# Patient Record
Sex: Female | Born: 1975 | Race: White | Hispanic: No | Marital: Single | Smoking: Never smoker
Health system: Southern US, Community
[De-identification: ages and names within clinical notes are randomized; demographics above are authoritative.]

## PROBLEM LIST (undated history)

## (undated) DIAGNOSIS — G43909 Migraine, unspecified, not intractable, without status migrainosus: Secondary | ICD-10-CM

## (undated) DIAGNOSIS — B019 Varicella without complication: Secondary | ICD-10-CM

## (undated) DIAGNOSIS — T7840XA Allergy, unspecified, initial encounter: Secondary | ICD-10-CM

## (undated) DIAGNOSIS — F329 Major depressive disorder, single episode, unspecified: Secondary | ICD-10-CM

## (undated) DIAGNOSIS — J45909 Unspecified asthma, uncomplicated: Secondary | ICD-10-CM

## (undated) DIAGNOSIS — J449 Chronic obstructive pulmonary disease, unspecified: Secondary | ICD-10-CM

## (undated) DIAGNOSIS — F32A Depression, unspecified: Secondary | ICD-10-CM

## (undated) HISTORY — DX: Allergy, unspecified, initial encounter: T78.40XA

## (undated) HISTORY — DX: Chronic obstructive pulmonary disease, unspecified: J44.9

## (undated) HISTORY — DX: Depression, unspecified: F32.A

## (undated) HISTORY — PX: CHOLECYSTECTOMY: SHX55

## (undated) HISTORY — DX: Varicella without complication: B01.9

## (undated) HISTORY — DX: Unspecified asthma, uncomplicated: J45.909

## (undated) HISTORY — DX: Migraine, unspecified, not intractable, without status migrainosus: G43.909

## (undated) HISTORY — DX: Major depressive disorder, single episode, unspecified: F32.9

---

## 2001-05-19 ENCOUNTER — Ambulatory Visit (HOSPITAL_COMMUNITY): Admission: RE | Admit: 2001-05-19 | Discharge: 2001-05-19 | Payer: Self-pay | Admitting: Infectious Diseases

## 2001-05-19 ENCOUNTER — Encounter: Payer: Self-pay | Admitting: Infectious Diseases

## 2002-04-25 ENCOUNTER — Emergency Department (HOSPITAL_COMMUNITY): Admission: EM | Admit: 2002-04-25 | Discharge: 2002-04-25 | Payer: Self-pay | Admitting: Emergency Medicine

## 2003-07-19 ENCOUNTER — Other Ambulatory Visit: Admission: RE | Admit: 2003-07-19 | Discharge: 2003-07-19 | Payer: Self-pay | Admitting: Obstetrics and Gynecology

## 2007-07-21 ENCOUNTER — Emergency Department (HOSPITAL_COMMUNITY): Admission: EM | Admit: 2007-07-21 | Discharge: 2007-07-22 | Payer: Self-pay | Admitting: Emergency Medicine

## 2008-01-03 ENCOUNTER — Ambulatory Visit: Payer: Self-pay | Admitting: Internal Medicine

## 2008-01-03 DIAGNOSIS — F329 Major depressive disorder, single episode, unspecified: Secondary | ICD-10-CM

## 2008-01-03 DIAGNOSIS — J309 Allergic rhinitis, unspecified: Secondary | ICD-10-CM | POA: Insufficient documentation

## 2008-01-03 DIAGNOSIS — F32A Depression, unspecified: Secondary | ICD-10-CM | POA: Insufficient documentation

## 2008-01-03 DIAGNOSIS — G43829 Menstrual migraine, not intractable, without status migrainosus: Secondary | ICD-10-CM | POA: Insufficient documentation

## 2008-01-03 DIAGNOSIS — J018 Other acute sinusitis: Secondary | ICD-10-CM | POA: Insufficient documentation

## 2008-01-03 DIAGNOSIS — R519 Headache, unspecified: Secondary | ICD-10-CM | POA: Insufficient documentation

## 2008-01-03 DIAGNOSIS — R51 Headache: Secondary | ICD-10-CM | POA: Insufficient documentation

## 2008-01-03 DIAGNOSIS — F909 Attention-deficit hyperactivity disorder, unspecified type: Secondary | ICD-10-CM | POA: Insufficient documentation

## 2008-01-03 DIAGNOSIS — G43909 Migraine, unspecified, not intractable, without status migrainosus: Secondary | ICD-10-CM | POA: Insufficient documentation

## 2008-06-28 ENCOUNTER — Telehealth: Payer: Self-pay | Admitting: *Deleted

## 2009-02-06 ENCOUNTER — Ambulatory Visit: Payer: Self-pay | Admitting: Internal Medicine

## 2009-02-06 DIAGNOSIS — J069 Acute upper respiratory infection, unspecified: Secondary | ICD-10-CM | POA: Insufficient documentation

## 2009-03-02 ENCOUNTER — Telehealth: Payer: Self-pay | Admitting: Internal Medicine

## 2009-08-02 ENCOUNTER — Encounter: Payer: Self-pay | Admitting: Internal Medicine

## 2009-08-24 ENCOUNTER — Ambulatory Visit: Payer: Self-pay | Admitting: Internal Medicine

## 2009-08-24 DIAGNOSIS — F411 Generalized anxiety disorder: Secondary | ICD-10-CM | POA: Insufficient documentation

## 2009-08-24 DIAGNOSIS — F438 Other reactions to severe stress: Secondary | ICD-10-CM

## 2009-10-09 ENCOUNTER — Telehealth: Payer: Self-pay | Admitting: *Deleted

## 2010-06-28 ENCOUNTER — Encounter: Payer: Self-pay | Admitting: Internal Medicine

## 2011-01-09 NOTE — Letter (Signed)
Summary: [facility]  [facility]   Imported By: Maryln Gottron 07/02/2010 14:43:34  _____________________________________________________________________  External Attachment:    Type:   Image     Comment:   External Document

## 2011-09-22 LAB — I-STAT 8, (EC8 V) (CONVERTED LAB)
Acid-Base Excess: 2
BUN: 12
Bicarbonate: 25.4 — ABNORMAL HIGH
Chloride: 104
Glucose, Bld: 98
HCT: 39
Hemoglobin: 13.3
Operator id: 208821
Potassium: 3.2 — ABNORMAL LOW
Sodium: 139
TCO2: 27
pCO2, Ven: 36.3 — ABNORMAL LOW
pH, Ven: 7.453 — ABNORMAL HIGH

## 2011-09-22 LAB — POCT I-STAT CREATININE
Creatinine, Ser: 1
Operator id: 208821

## 2011-09-22 LAB — DIFFERENTIAL
Basophils Relative: 1
Eosinophils Absolute: 0.1
Eosinophils Relative: 2
Lymphs Abs: 2.9
Monocytes Relative: 13 — ABNORMAL HIGH

## 2011-09-22 LAB — CBC
HCT: 36.2
Hemoglobin: 12.4
MCHC: 34.3
MCV: 84.7
Platelets: 283
RBC: 4.27
RDW: 12.6
WBC: 6.7

## 2011-12-25 ENCOUNTER — Ambulatory Visit (INDEPENDENT_AMBULATORY_CARE_PROVIDER_SITE_OTHER): Payer: Commercial Managed Care - PPO

## 2011-12-25 DIAGNOSIS — R059 Cough, unspecified: Secondary | ICD-10-CM

## 2011-12-25 DIAGNOSIS — R5383 Other fatigue: Secondary | ICD-10-CM

## 2011-12-25 DIAGNOSIS — J039 Acute tonsillitis, unspecified: Secondary | ICD-10-CM

## 2011-12-25 DIAGNOSIS — R5381 Other malaise: Secondary | ICD-10-CM

## 2011-12-25 DIAGNOSIS — R05 Cough: Secondary | ICD-10-CM

## 2012-08-27 ENCOUNTER — Encounter: Payer: Self-pay | Admitting: Internal Medicine

## 2012-12-02 ENCOUNTER — Ambulatory Visit (INDEPENDENT_AMBULATORY_CARE_PROVIDER_SITE_OTHER): Payer: Commercial Managed Care - PPO | Admitting: Physician Assistant

## 2012-12-02 VITALS — BP 168/86 | HR 84 | Temp 98.2°F | Resp 17 | Ht 68.0 in | Wt 231.0 lb

## 2012-12-02 DIAGNOSIS — J039 Acute tonsillitis, unspecified: Secondary | ICD-10-CM

## 2012-12-02 DIAGNOSIS — J029 Acute pharyngitis, unspecified: Secondary | ICD-10-CM

## 2012-12-02 DIAGNOSIS — R059 Cough, unspecified: Secondary | ICD-10-CM

## 2012-12-02 DIAGNOSIS — R05 Cough: Secondary | ICD-10-CM

## 2012-12-02 MED ORDER — HYDROCOD POLST-CHLORPHEN POLST 10-8 MG/5ML PO LQCR: 5.0000 mL | Freq: Two times a day (BID) | ORAL | Status: DC | PRN

## 2012-12-02 MED ORDER — FIRST-DUKES MOUTHWASH MT SUSP: 5.0000 mL | OROMUCOSAL | Status: DC

## 2012-12-02 MED ORDER — AZITHROMYCIN 250 MG PO TABS: ORAL_TABLET | ORAL | Status: DC

## 2012-12-02 NOTE — Progress Notes (Signed)
  Subjective:    Patient ID: Sherri Miller, female    DOB: 1976/05/11, 36 y.o.   MRN: [id number]  HPI 36 year old female presents with acute onset of productive cough, sore throat, swollen glands, and some chills.  Has a history of similar symptoms that progress to non-streptococcal tonsillitis, usually about 1x/year.  Has not taken any medications for this. Denies nausea, vomiting, otalgia, sinus pain, abdominal pain, or fevers.  She is otherwise healthy with no other complaints today.      Review of Systems  Constitutional: Positive for chills. Negative for fever.  HENT: Positive for congestion, sore throat and postnasal drip. Negative for ear pain and sinus pressure.   Respiratory: Positive for cough. Negative for shortness of breath and wheezing.   Cardiovascular: Negative for chest pain.  Gastrointestinal: Negative for nausea, vomiting and abdominal pain.  Neurological: Negative for dizziness and headaches.       Objective:   Physical Exam  Constitutional: She is oriented to person, place, and time. She appears well-developed and well-nourished.  HENT:  Head: Normocephalic and atraumatic.  Right Ear: External ear normal.  Left Ear: External ear normal.  Mouth/Throat: No oropharyngeal exudate (bilateral tonsillar erythema, no tonsillar swelling).  Eyes: Conjunctivae normal are normal.  Neck: Normal range of motion.  Cardiovascular: Normal rate, regular rhythm and normal heart sounds.   Pulmonary/Chest: Effort normal and breath sounds normal.  Lymphadenopathy:    Cervical adenopathy: AC.  Neurological: She is alert and oriented to person, place, and time.  Psychiatric: She has a normal mood and affect. Her behavior is normal. Judgment and thought content normal.          Assessment & Plan:   1. Tonsillitis  azithromycin (ZITHROMAX) 250 MG tablet  2. Cough  chlorpheniramine-HYDROcodone (TUSSIONEX PENNKINETIC ER) 10-8 MG/5ML LQCR  3. Acute pharyngitis  azithromycin  (ZITHROMAX) 250 MG tablet, Diphenhyd-Hydrocort-Nystatin (FIRST-DUKES MOUTHWASH) SUSP   Zpack - patient has not taken thorazine in >3 months - takes it only for severe migraines.  Instructed her not to use this until she has completed Zpack Tussionex bid prn cough Mucinex OTC as directed Increase fluids and rest Follow up if symptoms worsen or fail to improve.

## 2013-03-18 ENCOUNTER — Ambulatory Visit (INDEPENDENT_AMBULATORY_CARE_PROVIDER_SITE_OTHER): Payer: Commercial Managed Care - PPO | Admitting: Internal Medicine

## 2013-03-18 VITALS — BP 120/88 | HR 103 | Temp 98.5°F | Resp 16 | Ht 68.0 in | Wt 229.0 lb

## 2013-03-18 DIAGNOSIS — R05 Cough: Secondary | ICD-10-CM

## 2013-03-18 DIAGNOSIS — R52 Pain, unspecified: Secondary | ICD-10-CM

## 2013-03-18 DIAGNOSIS — J209 Acute bronchitis, unspecified: Secondary | ICD-10-CM

## 2013-03-18 DIAGNOSIS — R509 Fever, unspecified: Secondary | ICD-10-CM

## 2013-03-18 DIAGNOSIS — R059 Cough, unspecified: Secondary | ICD-10-CM

## 2013-03-18 LAB — POCT INFLUENZA A/B: Influenza A, POC: NEGATIVE

## 2013-03-18 MED ORDER — HYDROCODONE-ACETAMINOPHEN 7.5-325 MG/15ML PO SOLN: 5.0000 mL | Freq: Four times a day (QID) | ORAL | Status: DC | PRN

## 2013-03-18 MED ORDER — AZITHROMYCIN 500 MG PO TABS: 500.0000 mg | ORAL_TABLET | Freq: Every day | ORAL | Status: DC

## 2013-03-18 NOTE — Progress Notes (Signed)
  Subjective:    Patient ID: Sherri Miller, female    DOB: [birth year], 37 y.o.   MRN: [id number]  HPI Has fever cough, head congestion. No sob, cp. 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 Hot and sweaty.  Review of Systems     Objective:   Physical Exam  Constitutional: She is oriented to person, place, and time. She appears well-developed and well-nourished.  HENT:  Right Ear: External ear normal.  Left Ear: External ear normal.  Nose: Mucosal edema, rhinorrhea and sinus tenderness present. Right sinus exhibits no maxillary sinus tenderness and no frontal sinus tenderness. Left sinus exhibits no maxillary sinus tenderness and no frontal sinus tenderness.  Mouth/Throat: Oropharynx is clear and moist.  Eyes: EOM are normal.  Neck: No thyromegaly present.  Cardiovascular: Normal rate, regular rhythm and normal heart sounds.   Pulmonary/Chest: Effort normal and breath sounds normal. She has no wheezes. She exhibits no tenderness.  Lymphadenopathy:    She has no cervical adenopathy.  Neurological: She is alert and oriented to person, place, and time. No cranial nerve deficit. She exhibits normal muscle tone. Coordination normal.  Skin: No rash noted.  Psychiatric: She has a normal mood and affect.      Results for orders placed in visit on 03/18/13  POCT INFLUENZA A/B      Result Value Range   Influenza A, POC Negative     Influenza B, POC Negative         Assessment & Plan:  Mycoplasma likely Zithromax 500mg /LOrtab elixir

## 2013-03-18 NOTE — Patient Instructions (Addendum)

## 2013-12-25 ENCOUNTER — Ambulatory Visit (INDEPENDENT_AMBULATORY_CARE_PROVIDER_SITE_OTHER): Payer: Commercial Managed Care - PPO | Admitting: Internal Medicine

## 2013-12-25 VITALS — BP 124/84 | HR 91 | Temp 98.1°F | Resp 16 | Ht 68.0 in | Wt 237.0 lb

## 2013-12-25 DIAGNOSIS — H9209 Otalgia, unspecified ear: Secondary | ICD-10-CM

## 2013-12-25 DIAGNOSIS — H6590 Unspecified nonsuppurative otitis media, unspecified ear: Secondary | ICD-10-CM

## 2013-12-25 NOTE — Progress Notes (Signed)
   Subjective:    Patient ID: Sherri Miller, female    DOB: 05/29/1976, 38 y.o.   MRN: [id number]  HPI 38 yr old is here with complaints of ear pain in both ears. She states she feels more inner pressure and pain. Denies throat pain, sinus pressure, headache, or nasal drainage. She states she took a hydrocodone last night due to the pain. Could not get ears to pop till this am.  No uri sxs. No cp,sob, fever, teeth pain, or jaw pain.    Review of Systems Neg/Migrains    Objective:   Physical Exam  Vitals reviewed. Constitutional: She is oriented to person, place, and time. She appears well-developed and well-nourished. No distress.  HENT:  Head: Normocephalic.  Right Ear: External ear normal.  Left Ear: External ear normal.  Nose: Nose normal.  Mouth/Throat: Oropharynx is clear and moist.  Eyes: EOM are normal. Pupils are equal, round, and reactive to light.  Neck: Normal range of motion. Neck supple. No tracheal deviation present. No thyromegaly present.  Cardiovascular: Normal rate and normal heart sounds.   Pulmonary/Chest: Effort normal.  Lymphadenopathy:    She has no cervical adenopathy.  Neurological: She is alert and oriented to person, place, and time. She exhibits normal muscle tone. Coordination normal.  Skin: No rash noted.  Psychiatric: She has a normal mood and affect. Her behavior is normal.    Normal exam      Assessment & Plan:  ETD care

## 2013-12-25 NOTE — Progress Notes (Signed)
   Subjective:    Patient ID: Sherri AltesNicole L Anastas, female    DOB: 1976-03-01, 38 y.o.   MRN: [id number]  HPI    Review of Systems     Objective:   Physical Exam        Assessment & Plan:

## 2013-12-25 NOTE — Patient Instructions (Signed)
Serous Otitis Media  Serous otitis media is fluid in the middle ear space. This space contains the bones for hearing and air. Air in the middle ear space helps to transmit sound.  The air gets there through the eustachian tube. This tube goes from the back of the nose (nasopharynx) to the middle ear space. It keeps the pressure in the middle ear the same as the outside world. It also helps to drain fluid from the middle ear space. CAUSES  Serous otitis media occurs when the eustachian tube gets blocked. Blockage can come from:  Ear infections.  Colds and other upper respiratory infections.  Allergies.  Irritants such as cigarette smoke.  Sudden changes in air pressure (such as descending in an airplane).  Enlarged adenoids.  A mass in the nasopharynx. During colds and upper respiratory infections, the middle ear space can become temporarily filled with fluid. This can happen after an ear infection also. Once the infection clears, the fluid will generally drain out of the ear through the eustachian tube. If it does not, then serous otitis media occurs. SIGNS AND SYMPTOMS   Hearing loss.  A feeling of fullness in the ear, without pain.  Young children may not show any symptoms but may show slight behavioral changes, such as agitation, ear pulling, or crying. DIAGNOSIS  Serous otitis media is diagnosed by an ear exam. Tests may be done to check on the movement of the eardrum. Hearing exams may also be done. TREATMENT  The fluid most often goes away without treatment. If allergy is the cause, allergy treatment may be helpful. Fluid that persists for several months may require minor surgery. A small tube is placed in the eardrum to:  Drain the fluid.  Restore the air in the middle ear space. In certain situations, antibiotics are used to avoid surgery. Surgery may be done to remove enlarged adenoids (if this is the cause). HOME CARE INSTRUCTIONS   Keep children away from tobacco  smoke.  Be sure to keep any follow-up appointments. SEEK MEDICAL CARE IF:   Your hearing is not better in 3 months.  Your hearing is worse.  You have ear pain.  You have drainage from the ear.  You have dizziness.  You have serous otitis media only in one ear or have any bleeding from your nose (epistaxis).  You notice a lump on your neck. MAKE SURE YOU:  Understand these instructions.   Will watch your condition.   Will get help right away if you are not doing well or get worse.  Document Released: 02/14/2004 Document Revised: 07/27/2013 Document Reviewed: 06/21/2013 [employer] Patient Information 2014 [employer].  

## 2014-01-13 ENCOUNTER — Ambulatory Visit: Payer: Commercial Managed Care - PPO

## 2014-05-19 DIAGNOSIS — M47816 Spondylosis without myelopathy or radiculopathy, lumbar region: Secondary | ICD-10-CM | POA: Insufficient documentation

## 2014-05-19 DIAGNOSIS — M5136 Other intervertebral disc degeneration, lumbar region: Secondary | ICD-10-CM | POA: Insufficient documentation

## 2014-05-19 DIAGNOSIS — M5416 Radiculopathy, lumbar region: Secondary | ICD-10-CM | POA: Insufficient documentation

## 2014-11-04 ENCOUNTER — Encounter (HOSPITAL_COMMUNITY): Payer: Self-pay | Admitting: *Deleted

## 2014-11-04 ENCOUNTER — Emergency Department (HOSPITAL_COMMUNITY)
Admission: EM | Admit: 2014-11-04 | Discharge: 2014-11-04 | Disposition: A | Discharge status: Home / Self Care | Payer: Commercial Managed Care - PPO | Attending: Emergency Medicine | Admitting: Emergency Medicine

## 2014-11-04 DIAGNOSIS — Z880 Allergy status to penicillin: Secondary | ICD-10-CM | POA: Insufficient documentation

## 2014-11-04 DIAGNOSIS — Z79899 Other long term (current) drug therapy: Secondary | ICD-10-CM | POA: Diagnosis not present

## 2014-11-04 DIAGNOSIS — G43909 Migraine, unspecified, not intractable, without status migrainosus: Secondary | ICD-10-CM | POA: Diagnosis present

## 2014-11-04 DIAGNOSIS — Z792 Long term (current) use of antibiotics: Secondary | ICD-10-CM | POA: Insufficient documentation

## 2014-11-04 DIAGNOSIS — G43109 Migraine with aura, not intractable, without status migrainosus: Secondary | ICD-10-CM | POA: Insufficient documentation

## 2014-11-04 MED ORDER — METOCLOPRAMIDE HCL 5 MG/ML IJ SOLN
10.0000 mg | Freq: Once | INTRAMUSCULAR | Status: AC
  Administered 2014-11-04: 10 mg via INTRAVENOUS
  Filled 2014-11-04: qty 2

## 2014-11-04 NOTE — ED Notes (Signed)
Pt reports severe migraine with n/v since 5:00 this am, hx of same, sts just needs a cocktail to help her quick

## 2014-11-04 NOTE — Discharge Instructions (Signed)
Migraine Headache Contact your neurologist next week to let him know that you have are having recurrent migraines. Return if your condition worsens for any reason A migraine headache is an intense, throbbing pain on one or both sides of your head. A migraine can last for 30 minutes to several hours. CAUSES  The exact cause of a migraine headache is not always known. However, a migraine may be caused when nerves in the brain become irritated and release chemicals that cause inflammation. This causes pain. Certain things may also trigger migraines, such as:  Alcohol.  Smoking.  Stress.  Menstruation.  Aged cheeses.  Foods or drinks that contain nitrates, glutamate, aspartame, or tyramine.  Lack of sleep.  Chocolate.  Caffeine.  Hunger.  Physical exertion.  Fatigue.  Medicines used to treat chest pain (nitroglycerine), birth control pills, estrogen, and some blood pressure medicines. SIGNS AND SYMPTOMS  Pain on one or both sides of your head.  Pulsating or throbbing pain.  Severe pain that prevents daily activities.  Pain that is aggravated by any physical activity.  Nausea, vomiting, or both.  Dizziness.  Pain with exposure to bright lights, loud noises, or activity.  General sensitivity to bright lights, loud noises, or smells. Before you get a migraine, you may get warning signs that a migraine is coming (aura). An aura may include:  Seeing flashing lights.  Seeing bright spots, halos, or zigzag lines.  Having tunnel vision or blurred vision.  Having feelings of numbness or tingling.  Having trouble talking.  Having muscle weakness. DIAGNOSIS  A migraine headache is often diagnosed based on:  Symptoms.  Physical exam.  A CT scan or MRI of your head. These imaging tests cannot diagnose migraines, but they can help rule out other causes of headaches. TREATMENT Medicines may be given for pain and nausea. Medicines can also be given to help prevent  recurrent migraines.  HOME CARE INSTRUCTIONS  Only take over-the-counter or prescription medicines for pain or discomfort as directed by your health care provider. The use of long-term narcotics is not recommended.  Lie down in a dark, quiet room when you have a migraine.  Keep a journal to find out what may trigger your migraine headaches. For example, write down:  What you eat and drink.  How much sleep you get.  Any change to your diet or medicines.  Limit alcohol consumption.  Quit smoking if you smoke.  Get 7-9 hours of sleep, or as recommended by your health care provider.  Limit stress.  Keep lights dim if bright lights bother you and make your migraines worse. SEEK IMMEDIATE MEDICAL CARE IF:   Your migraine becomes severe.  You have a fever.  You have a stiff neck.  You have vision loss.  You have muscular weakness or loss of muscle control.  You start losing your balance or have trouble walking.  You feel faint or pass out.  You have severe symptoms that are different from your first symptoms. MAKE SURE YOU:   Understand these instructions.  Will watch your condition.  Will get help right away if you are not doing well or get worse. Document Released: 11/24/2005 Document Revised: 04/10/2014 Document Reviewed: 08/01/2013 [facility] Patient Information 2015 [place]. This information is not intended to replace advice given to you by your health care provider. Make sure you discuss any questions you have with your health care provider.

## 2014-11-04 NOTE — ED Provider Notes (Signed)
CSN: [id number]     Arrival date & time 11/04/14  0902 History   First MD Initiated Contact with Patient 11/04/14 [phone]     Chief Complaint  Patient presents with  . Migraine     (Consider location/radiation/quality/duration/timing/severity/associated sxs/prior Treatment) HPI Complains of migraine headache onset 5 AM today described as throbbing initially frontal has spread to her entire head typical of migraine she's had in the past. Symptoms accompanied by nausea and photophobia. Treated with cyclobenzaprine without relief. Pain worse with light not improved with anything. Past Medical History  Diagnosis Date  . Migraine   . Allergy    History reviewed. No pertinent past surgical history. No family history on file. History  Substance Use Topics  . Smoking status: Never Smoker   . Smokeless tobacco: Not on file  . Alcohol Use: No   OB History    No data available     Review of Systems  Eyes: Positive for photophobia.  Gastrointestinal: Positive for nausea.  Neurological: Positive for headaches.  All other systems reviewed and are negative.     Allergies  Codeine and Penicillins  Home Medications   Prior to Admission medications   Medication Sig Start Date End Date Taking? Authorizing Provider  azithromycin (ZITHROMAX) 250 MG tablet Take 2 tabs PO x 1 dose, then 1 tab PO QD x 4 days 12/02/12   Nelva NayHeather M Marte, PA-C  azithromycin (ZITHROMAX) 500 MG tablet Take 1 tablet (500 mg total) by mouth daily. 03/18/13   Jonita Albeehris W Guest, MD  chlorpheniramine-HYDROcodone [facility](TUSSIONEX PENNKINETIC ER) 10-8 MG/5ML LQCR Take 5 mLs by mouth every 12 (twelve) hours as needed (cough). 12/02/12   Heather Jaquita RectorM Marte, PA-C  chlorproMAZINE (THORAZINE) 10 MG tablet Take 10 mg by mouth every 6 (six) hours as needed.    Linus SalmonsJoseph K Miller, MD  Diphenhyd-Hydrocort-Nystatin (FIRST-DUKES MOUTHWASH) SUSP Use as directed 5 mLs in the mouth or throat every 3 (three) hours. Use 1:1 ratio with viscous lidocaine  12/02/12   Nelva NayHeather M Marte, PA-C  drospirenone-ethinyl estradiol (YAZ,GIANVI,LORYNA) 3-0.02 MG tablet Take 1 tablet by mouth daily.    Historical Provider, MD  frovatriptan (FROVA) 2.5 MG tablet Take 2.5 mg by mouth as needed. If recurs, may repeat after 2 hours. Max of 3 tabs in 24 hours.    Linus SalmonsJoseph K Miller, MD  gabapentin (NEURONTIN) 600 MG tablet Take 600 mg by mouth 3 (three) times daily.    Historical Provider, MD  HYDROcodone-acetaminophen (HYCET) 7.5-325 mg/15 ml solution Take 5 mLs by mouth every 6 (six) hours as needed for pain (or cough). 03/18/13   Jonita Albeehris W Guest, MD  hydrocodone-acetaminophen (LORCET-HD) 5-500 MG per capsule Take 1 capsule by mouth every 4 (four) hours as needed.    Linus SalmonsJoseph K Miller, MD  ondansetron (ZOFRAN) 8 MG tablet Take 8 mg by mouth every 8 (eight) hours as needed.    Linus SalmonsJoseph K Miller, MD  topiramate (TOPAMAX) 100 MG tablet Take 100 mg by mouth 2 (two) times daily.    Linus SalmonsJoseph K Miller, MD   BP 138/96 mmHg  Pulse 71  Temp(Src) 97.6 F (36.4 C) (Oral)  Resp 22  SpO2 100% Physical Exam  Constitutional: She is oriented to person, place, and time. She appears well-developed and well-nourished. She appears distressed.  Appears uncomfortable  HENT:  Head: Normocephalic and atraumatic.  Eyes: Conjunctivae are normal. Pupils are equal, round, and reactive to light.  Neck: Neck supple. No tracheal deviation present. No thyromegaly present.  Cardiovascular: Normal rate  and regular rhythm.   No murmur heard. Pulmonary/Chest: Effort normal and breath sounds normal.  Abdominal: Soft. Bowel sounds are normal. She exhibits no distension. There is no tenderness.  Musculoskeletal: Normal range of motion. She exhibits no edema or tenderness.  Neurological: She is alert and oriented to person, place, and time. She displays normal reflexes. No cranial nerve deficit. She exhibits normal muscle tone. Coordination normal.  Gait normal Romberg normal pronator drift normal finger to  nose normal DTR symmetric bilaterally at knee jerk ankle jerk and biceps toes downward going bilaterally  Skin: Skin is warm and dry. No rash noted.  Psychiatric: She has a normal mood and affect.  Nursing note and vitals reviewed.   ED Course  Procedures (including critical care time) Labs Review Labs Reviewed - No data to display  Imaging Review No results found.   EKG Interpretation None     11 AM feels much improved and ready to go home after treatment with intravenous Reglan. Patient alert and motor Glasgow Coma Score 15 MDM  Plan follow-up with neurologist as needed Diagnosis migraine headache Final diagnoses:  None        Doug SouSam Eliza Green, MD 11/04/14 1105

## 2015-03-17 ENCOUNTER — Telehealth: Payer: Commercial Managed Care - PPO | Admitting: Nurse Practitioner

## 2015-03-17 DIAGNOSIS — J0100 Acute maxillary sinusitis, unspecified: Secondary | ICD-10-CM

## 2015-03-17 MED ORDER — AZITHROMYCIN 250 MG PO TABS: ORAL_TABLET | ORAL | Status: DC

## 2015-03-17 NOTE — Progress Notes (Signed)

## 2015-03-18 ENCOUNTER — Ambulatory Visit (INDEPENDENT_AMBULATORY_CARE_PROVIDER_SITE_OTHER): Payer: 59 | Admitting: Physician Assistant

## 2015-03-18 VITALS — BP 118/88 | HR 78 | Temp 97.8°F | Resp 18 | Ht 68.0 in | Wt 226.0 lb

## 2015-03-18 DIAGNOSIS — R058 Other specified cough: Secondary | ICD-10-CM

## 2015-03-18 DIAGNOSIS — J029 Acute pharyngitis, unspecified: Secondary | ICD-10-CM

## 2015-03-18 DIAGNOSIS — R05 Cough: Secondary | ICD-10-CM | POA: Diagnosis not present

## 2015-03-18 MED ORDER — MAGIC MOUTHWASH W/LIDOCAINE: 10.0000 mL | ORAL | Status: DC | PRN

## 2015-03-18 MED ORDER — HYDROCODONE-HOMATROPINE 5-1.5 MG/5ML PO SYRP: 5.0000 mL | ORAL_SOLUTION | Freq: Three times a day (TID) | ORAL | Status: DC | PRN

## 2015-03-18 MED ORDER — IPRATROPIUM BROMIDE 0.03 % NA SOLN: 2.0000 | Freq: Two times a day (BID) | NASAL | Status: DC

## 2015-03-18 NOTE — Progress Notes (Signed)
Subjective:    Patient ID: Sherri Miller, female    DOB: 09-Sep-1976, 39 y.o.   MRN: [id number]  HPI  This is a 39 year old female who is presenting with 4 days of sore throat, cough and nasal congestion. Not congested as much as she is coughing up mucous. She has post-nasal drip that is green. Reports sore throat worsened yesterday and she was unable to sleep last night d/t pain. Yesterday she had an E-visit and was prescribed zpak for sinusitis. She started taking this yesterday but no help in symptoms yet. Also taking mucinex and no help. No problems with environmental allergies. No history of asthma and not a smoker. Denies fever, chills, SOB or wheezing.  Review of Systems  Constitutional: Negative for fever and chills.  HENT: Positive for congestion and sore throat. Negative for ear pain and sinus pressure.   Eyes: Negative for redness.  Respiratory: Positive for cough. Negative for shortness of breath and wheezing.   Gastrointestinal: Negative for nausea and vomiting.  Skin: Negative for rash.  Hematological: Negative for adenopathy.  Psychiatric/Behavioral: Positive for sleep disturbance.   Patient Active Problem List   Diagnosis Date Noted  . ANXIETY, SITUATIONAL 08/24/2009  . URI 02/06/2009  . DEPRESSION 01/03/2008  . ADHD 01/03/2008  . MIGRAINE HEADACHE 01/03/2008  . OTHER ACUTE SINUSITIS 01/03/2008  . ALLERGIC RHINITIS 01/03/2008  . HEADACHE 01/03/2008   Prior to Admission medications   Medication Sig Start Date End Date Taking? Authorizing Provider  azithromycin (ZITHROMAX Z-PAK) 250 MG tablet As directed 03/17/15  Yes Mary-Margaret Daphine Deutscher, FNP  drospirenone-ethinyl estradiol (YAZ,GIANVI,LORYNA) 3-0.02 MG tablet Take 1 tablet by mouth daily.   Yes Historical Provider, MD  gabapentin (NEURONTIN) 600 MG tablet Take 600 mg by mouth 3 (three) times daily.   Yes Historical Provider, MD  ondansetron (ZOFRAN) 8 MG tablet Take 8 mg by mouth every 8 (eight) hours as needed for  nausea.    Yes Linus Salmons, MD  rizatriptan (MAXALT-MLT) 10 MG disintegrating tablet Take 10 mg by mouth as needed for migraine. May repeat in 2 hours if needed   Yes Historical Provider, MD   Allergies  Allergen Reactions  . Codeine Nausea And Vomiting  . Penicillins Hives   Patient's social and family history were reviewed.     Objective:   Physical Exam  Constitutional: She is oriented to person, place, and time. She appears well-developed and well-nourished. No distress.  HENT:  Head: Normocephalic and atraumatic.  Right Ear: Hearing, external ear and ear canal normal. Tympanic membrane is retracted.  Left Ear: Hearing, tympanic membrane, external ear and ear canal normal.  Nose: Nose normal. Right sinus exhibits no maxillary sinus tenderness and no frontal sinus tenderness. Left sinus exhibits no maxillary sinus tenderness and no frontal sinus tenderness.  Mouth/Throat: Uvula is midline and mucous membranes are normal. Posterior oropharyngeal erythema present. No oropharyngeal exudate or posterior oropharyngeal edema.  Eyes: Conjunctivae and lids are normal. Right eye exhibits no discharge. Left eye exhibits no discharge. No scleral icterus.  Cardiovascular: Normal rate, regular rhythm, normal heart sounds and normal pulses.   No murmur heard. Pulmonary/Chest: Effort normal and breath sounds normal. No respiratory distress. She has no wheezes. She has no rhonchi. She has no rales.  Musculoskeletal: Normal range of motion.  Lymphadenopathy:       Head (right side): No submental, no submandibular and no tonsillar adenopathy present.       Head (left side): No submental, no submandibular  and no tonsillar adenopathy present.    She has no cervical adenopathy.  Neurological: She is alert and oriented to person, place, and time.  Skin: Skin is warm, dry and intact. No lesion and no rash noted.  Psychiatric: She has a normal mood and affect. Her speech is normal and behavior is  normal. Thought content normal.   BP 118/88 mmHg  Pulse 78  Temp(Src) 97.8 F (36.6 C) (Oral)  Resp 18  Ht 5\' 8"  (1.727 m)  Wt 226 lb (102.513 kg)  BMI 34.37 kg/m2  SpO2 98%     Assessment & Plan:  1. Sore throat 2. Productive cough It is my personal opinion that the etiology of pt's symptoms is likely viral although pt received zpak through [facility] yesterday. Gave atrovent nasal spray, mouthwash and cough syrup for symptoms control. She will return if symptoms are not improving in 7-10 days.  - ipratropium (ATROVENT) 0.03 % nasal spray; Place 2 sprays into both nostrils 2 (two) times daily.  Dispense: 30 mL; Refill: 0 - Alum & Mag Hydroxide-Simeth (MAGIC MOUTHWASH W/LIDOCAINE) SOLN; Take 10 mLs by mouth every 2 (two) hours as needed for mouth pain.  Dispense: 360 mL; Refill: 0 - HYDROcodone-homatropine (HYCODAN) 5-1.5 MG/5ML syrup; Take 5 mLs by mouth every 8 (eight) hours as needed for cough.  Dispense: 100 mL; Refill: 0   Roswell MinersNicole V. Dyke BrackettBush, PA-C, MHS [facility]  03/18/2015

## 2015-03-18 NOTE — Patient Instructions (Signed)
Use cough syrup at night. Use mouthwash every 2-3 hours for throat pain. Nasal spray twice a day. Drink plenty of water and get plenty of rest.

## 2015-03-19 ENCOUNTER — Telehealth: Payer: 59 | Admitting: Family

## 2015-03-19 DIAGNOSIS — H109 Unspecified conjunctivitis: Secondary | ICD-10-CM

## 2015-03-19 MED ORDER — POLYMYXIN B-TRIMETHOPRIM 10000-0.1 UNIT/ML-% OP SOLN: 2.0000 [drp] | OPHTHALMIC | Status: DC

## 2015-03-19 NOTE — Progress Notes (Signed)
We are sorry that you are not feeling well.  Here is how we plan to help!  Based on what you have shared with me it looks like you have conjunctivitis.  Conjunctivitis is a common inflammatory or infectious condition of the eye that is often referred to as "pink eye".  In most cases it is contagious (viral or bacterial). However, not all conjunctivitis requires antibiotics (ex. Allergic).  We have made appropriate suggestions for you based upon your presentation.  I have prescribed Polytrim Ophthalmic drops 1-2 drops 4 times a day times 5 days to [facility]  Pink eye can be highly contagious.  It is typically spread through direct contact with secretions, or contaminated objects or surfaces that one may have touched.  Strict handwashing is suggested with soap and water is urged.  If not available, use alcohol based had sanitizer.  Avoid unnecessary touching of the eye.  If you wear contact lenses, you will need to refrain from wearing them until you see no white discharge from the eye for at least 24 hours after being on medication.  You should see symptom improvement in 1-2 days after starting the medication regimen.  Call us if symptoms are not improved in 1-2 days.  Home Care:  Wash your hands often!  Do not wear your contacts until you complete your treatment plan.  Avoid sharing towels, bed linen, personal items with a person who has pink eye.  See attention for anyone in your home with similar symptoms.  Get Help Right Away If:  Your symptoms do not improve.  You develop blurred or loss of vision.  Your symptoms worsen (increased discharge, pain or redness)  Your e-visit answers were reviewed by a board certified advanced clinical practitioner to complete your personal care plan.  Depending on the condition, your plan could have included both over the counter or prescription medications.  If there is a problem please reply  once you have received a response from your  provider.  Your safety is important to us.  If you have drug allergies check your prescription carefully.    You can use MyChart to ask questions about today's visit, request a non-urgent call back, or ask for a work or school excuse.  You will get an e-mail in the next two days asking about your experience.  I hope that your e-visit has been valuable and will speed your recovery. Thank you for using e-visits.

## 2015-03-22 ENCOUNTER — Telehealth: Payer: Commercial Managed Care - PPO | Admitting: Physician Assistant

## 2015-03-22 DIAGNOSIS — J209 Acute bronchitis, unspecified: Secondary | ICD-10-CM

## 2015-03-22 MED ORDER — BENZONATATE 200 MG PO CAPS: 200.0000 mg | ORAL_CAPSULE | Freq: Three times a day (TID) | ORAL | Status: DC | PRN

## 2015-03-22 NOTE — Progress Notes (Signed)
We are sorry that you are not feeling well.  Here is how we plan to help!  Based on what you have shared with me it looks like you have upper respiratory tract inflammation that has resulted in a significant cough.  Inflammation and infection in the upper respiratory tract is commonly called bronchitis and has four common causes:  Allergies, Viral Infections, Acid Reflux and Bacterial Infections.  Allergies, viruses and acid reflux are treated by controlling symptoms or eliminating the cause. An example might be a cough caused by taking certain blood pressure medications. You stop the cough by changing the medication. Another example might be a cough caused by acid reflux. Controlling the reflux helps control the cough.  Based on your presentation I believe you most likely have A cough due to bacteria.  When patients have a fever and a productive cough with a change in color or increased sputum production, we are concerned about bacterial bronchitis. I am glad you have finished your antibiotic.  The lingering cough will be present until the respiratory tract has completely healed.  In addition you may use A prescription cough medication called Tessalon Perles 100mg . You may take 1-2 capsules every 8 hours as needed for your cough.    HOME CARE . Only take medications as instructed by your medical team. . Complete the entire course of an antibiotic. . Drink plenty of fluids and get plenty of rest. . Avoid close contacts especially the very young and the elderly . Cover your mouth if you cough or cough into your sleeve. . Always remember to wash your hands . A steam or ultrasonic humidifier can help congestion.    GET HELP RIGHT AWAY IF: . You develop worsening fever. . You become short of breath . You cough up blood. . Your symptoms persist after you have completed your treatment plan MAKE SURE YOU   Understand these instructions.  Will watch your condition.  Will get help right away if  you are not doing well or get worse.  Your e-visit answers were reviewed by a board certified advanced clinical practitioner to complete your personal care plan.  Depending on the condition, your plan could have included both over the counter or prescription medications.  If there is a problem please reply  once you have received a response from your provider.  Your safety is important to us.  If you have drug allergies check your prescription carefully.    You can use MyChart to ask questions about today's visit, request a non-urgent call back, or ask for a work or school excuse.  You will get an e-mail in the next two days asking about your experience.  I hope that your e-visit has been valuable and will speed your recovery. Thank you for using e-visits.

## 2015-03-23 ENCOUNTER — Encounter: Payer: Self-pay | Admitting: Physician Assistant

## 2015-03-23 DIAGNOSIS — R05 Cough: Secondary | ICD-10-CM

## 2015-03-23 DIAGNOSIS — R059 Cough, unspecified: Secondary | ICD-10-CM

## 2015-03-24 MED ORDER — HYDROCODONE-HOMATROPINE 5-1.5 MG/5ML PO SYRP: 5.0000 mL | ORAL_SOLUTION | Freq: Every day | ORAL | Status: DC

## 2015-03-24 NOTE — Telephone Encounter (Signed)
She has contacted us about her continued cough.  I will refill the hycodan.  She will have to return to the clinic to pick up her cough medicine.  Please let her know this.

## 2015-03-26 ENCOUNTER — Ambulatory Visit (INDEPENDENT_AMBULATORY_CARE_PROVIDER_SITE_OTHER): Payer: 59

## 2015-03-26 ENCOUNTER — Ambulatory Visit (INDEPENDENT_AMBULATORY_CARE_PROVIDER_SITE_OTHER): Payer: 59 | Admitting: Internal Medicine

## 2015-03-26 VITALS — BP 110/82 | HR 85 | Temp 98.1°F | Resp 16 | Ht 68.0 in | Wt 226.0 lb

## 2015-03-26 DIAGNOSIS — R5382 Chronic fatigue, unspecified: Secondary | ICD-10-CM

## 2015-03-26 DIAGNOSIS — R05 Cough: Secondary | ICD-10-CM | POA: Diagnosis not present

## 2015-03-26 DIAGNOSIS — J209 Acute bronchitis, unspecified: Secondary | ICD-10-CM | POA: Diagnosis not present

## 2015-03-26 DIAGNOSIS — R059 Cough, unspecified: Secondary | ICD-10-CM

## 2015-03-26 DIAGNOSIS — R6883 Chills (without fever): Secondary | ICD-10-CM | POA: Diagnosis not present

## 2015-03-26 LAB — POCT CBC
Granulocyte percent: 73 %G (ref 37–80)
HEMATOCRIT: 37.4 % — AB (ref 37.7–47.9)
HEMOGLOBIN: 12.5 g/dL (ref 12.2–16.2)
LYMPH, POC: 2.1 (ref 0.6–3.4)
MCH: 27.7 pg (ref 27–31.2)
MCHC: 33.5 g/dL (ref 31.8–35.4)
MCV: 82.9 fL (ref 80–97)
MID (cbc): 0.6 (ref 0–0.9)
MPV: 7.5 fL (ref 0–99.8)
POC GRANULOCYTE: 7.2 — AB (ref 2–6.9)
POC LYMPH %: 21.4 % (ref 10–50)
POC MID %: 5.6 %M (ref 0–12)
Platelet Count, POC: 259 10*3/uL (ref 142–424)
RBC: 4.51 M/uL (ref 4.04–5.48)
RDW, POC: 12.8 %
WBC: 9.9 10*3/uL (ref 4.6–10.2)

## 2015-03-26 MED ORDER — HYDROCODONE-ACETAMINOPHEN 7.5-325 MG/15ML PO SOLN: 5.0000 mL | Freq: Four times a day (QID) | ORAL | Status: DC | PRN

## 2015-03-26 MED ORDER — FLUCONAZOLE 150 MG PO TABS: 150.0000 mg | ORAL_TABLET | Freq: Once | ORAL | Status: DC

## 2015-03-26 MED ORDER — AZITHROMYCIN 500 MG PO TABS: 500.0000 mg | ORAL_TABLET | Freq: Every day | ORAL | Status: DC

## 2015-03-26 NOTE — Patient Instructions (Addendum)
Acute Bronchitis Bronchitis is inflammation of the airways that extend from the windpipe into the lungs (bronchi). The inflammation often causes mucus to develop. This leads to a cough, which is the most common symptom of bronchitis.  In acute bronchitis, the condition usually develops suddenly and goes away over time, usually in a couple weeks. Smoking, allergies, and asthma can make bronchitis worse. Repeated episodes of bronchitis may cause further lung problems.  CAUSES Acute bronchitis is most often caused by the same virus that causes a cold. The virus can spread from person to person (contagious) through coughing, sneezing, and touching contaminated objects. SIGNS AND SYMPTOMS   Cough.   Fever.   Coughing up mucus.   Body aches.   Chest congestion.   Chills.   Shortness of breath.   Sore throat.  DIAGNOSIS  Acute bronchitis is usually diagnosed through a physical exam. Your health care provider will also ask you questions about your medical history. Tests, such as chest X-rays, are sometimes done to rule out other conditions.  TREATMENT  Acute bronchitis usually goes away in a couple weeks. Oftentimes, no medical treatment is necessary. Medicines are sometimes given for relief of fever or cough. Antibiotic medicines are usually not needed but may be prescribed in certain situations. In some cases, an inhaler may be recommended to help reduce shortness of breath and control the cough. A cool mist vaporizer may also be used to help thin bronchial secretions and make it easier to clear the chest.  HOME CARE INSTRUCTIONS  Get plenty of rest.   Drink enough fluids to keep your urine clear or pale yellow (unless you have a medical condition that requires fluid restriction). Increasing fluids may help thin your respiratory secretions (sputum) and reduce chest congestion, and it will prevent dehydration.   Take medicines only as directed by your health care provider.  If  you were prescribed an antibiotic medicine, finish it all even if you start to feel better.  Avoid smoking and secondhand smoke. Exposure to cigarette smoke or irritating chemicals will make bronchitis worse. If you are a smoker, consider using nicotine gum or skin patches to help control withdrawal symptoms. Quitting smoking will help your lungs heal faster.   Reduce the chances of another bout of acute bronchitis by washing your hands frequently, avoiding people with cold symptoms, and trying not to touch your hands to your mouth, nose, or eyes.   Keep all follow-up visits as directed by your health care provider.  SEEK MEDICAL CARE IF: Your symptoms do not improve after 1 week of treatment.  SEEK IMMEDIATE MEDICAL CARE IF:  You develop an increased fever or chills.   You have chest pain.   You have severe shortness of breath.  You have bloody sputum.   You develop dehydration.  You faint or repeatedly feel like you are going to pass out.  You develop repeated vomiting.  You develop a severe headache. MAKE SURE YOU:   Understand these instructions.  Will watch your condition.  Will get help right away if you are not doing well or get worse. Document Released: 01/01/2005 Document Revised: 04/10/2014 Document Reviewed: 05/17/2013 ExitCare Patient Information 2015 [employer]. This information is not intended to replace advice given to you by your health care provider. Make sure you discuss any questions you have with your health care provider. Cough, Adult  A cough is a reflex that helps clear your throat and airways. It can help heal the body or may be   a reaction to an irritated airway. A cough may only last 2 or 3 weeks (acute) or may last more than 8 weeks (chronic).  CAUSES Acute cough:  Viral or bacterial infections. Chronic cough:  Infections.  Allergies.  Asthma.  Post-nasal drip.  Smoking.  Heartburn or acid reflux.  Some medicines.  Chronic  lung problems (COPD).  Cancer. SYMPTOMS   Cough.  Fever.  Chest pain.  Increased breathing rate.  High-pitched whistling sound when breathing (wheezing).  Colored mucus that you cough up (sputum). TREATMENT   A bacterial cough may be treated with antibiotic medicine.  A viral cough must run its course and will not respond to antibiotics.  Your caregiver may recommend other treatments if you have a chronic cough. HOME CARE INSTRUCTIONS   Only take over-the-counter or prescription medicines for pain, discomfort, or fever as directed by your caregiver. Use cough suppressants only as directed by your caregiver.  Use a cold steam vaporizer or humidifier in your bedroom or home to help loosen secretions.  Sleep in a semi-upright position if your cough is worse at night.  Rest as needed.  Stop smoking if you smoke. SEEK IMMEDIATE MEDICAL CARE IF:   You have pus in your sputum.  Your cough starts to worsen.  You cannot control your cough with suppressants and are losing sleep.  You begin coughing up blood.  You have difficulty breathing.  You develop pain which is getting worse or is uncontrolled with medicine.  You have a fever. MAKE SURE YOU:   Understand these instructions.  Will watch your condition.  Will get help right away if you are not doing well or get worse. Document Released: 05/23/2011 Document Revised: 02/16/2012 Document Reviewed: 05/23/2011 ExitCare Patient Information 2015 [employer]. This information is not intended to replace advice given to you by your health care provider. Make sure you discuss any questions you have with your health care provider.  

## 2015-03-26 NOTE — Progress Notes (Signed)
   Subjective:    Patient ID: Sherri Miller, female    DOB: 03/07/1976, 39 y.o.   MRN: [id number]  HPI 39 year old female  Works for [employer] Complains of cough not sleeping well  Chest pain from coughing No fever      Review of Systems  Constitutional: Positive for fever and fatigue.  HENT: Positive for rhinorrhea and sinus pressure.   Respiratory: Positive for cough.   Cardiovascular: Negative for chest pain.  Allergic/Immunologic: Negative.   Neurological: Negative.   Psychiatric/Behavioral: Negative.        Objective:   Physical Exam  Constitutional: She is oriented to person, place, and time. She appears well-developed and well-nourished. No distress.  HENT:  Head: Normocephalic.  Right Ear: External ear normal.  Left Ear: External ear normal.  Nose: Mucosal edema and rhinorrhea present.  Mouth/Throat: Oropharynx is clear and moist.  Eyes: Conjunctivae and EOM are normal. Pupils are equal, round, and reactive to light.  Neck: Normal range of motion. Neck supple. No thyromegaly present.  Cardiovascular: Normal rate, regular rhythm and normal heart sounds.   Pulmonary/Chest: Effort normal. No tachypnea. No respiratory distress. She has decreased breath sounds. She has no wheezes. She has rhonchi. She has no rales. She exhibits tenderness.  Lymphadenopathy:    She has no cervical adenopathy.  Neurological: She is alert and oriented to person, place, and time. She exhibits normal muscle tone. Coordination normal.  Psychiatric: She has a normal mood and affect.     Results for orders placed or performed in visit on 03/26/15  POCT CBC  Result Value Ref Range   WBC 9.9 4.6 - 10.2 K/uL   Lymph, poc 2.1 0.6 - 3.4   POC LYMPH PERCENT 21.4 10 - 50 %L   MID (cbc) 0.6 0 - 0.9   POC MID % 5.6 0 - 12 %M   POC Granulocyte 7.2 (A) 2 - 6.9   Granulocyte percent 73.0 37 - 80 %G   RBC 4.51 4.04 - 5.48 M/uL   Hemoglobin 12.5 12.2 - 16.2 g/dL   HCT, POC 40.937.4 (A) 81.137.7 - 47.9  %   MCV 82.9 80 - 97 fL   MCH, POC 27.7 27 - 31.2 pg   MCHC 33.5 31.8 - 35.4 g/dL   RDW, POC 91.412.8 %   Platelet Count, POC 259 142 - 424 K/uL   MPV 7.5 0 - 99.8 fL   [facility] reading (PRIMARY) by  Dr.Guest thickened bronchioles Right lower       Assessment & Plan:  Bronchitis/persistant Zithromax 500mg /Lortab elixir/diflucan yeast prevention

## 2015-03-29 ENCOUNTER — Ambulatory Visit (INDEPENDENT_AMBULATORY_CARE_PROVIDER_SITE_OTHER): Payer: 59 | Admitting: Family Medicine

## 2015-03-29 VITALS — BP 106/80 | HR 105 | Temp 98.1°F | Resp 16 | Ht 68.0 in | Wt 225.5 lb

## 2015-03-29 DIAGNOSIS — R059 Cough, unspecified: Secondary | ICD-10-CM

## 2015-03-29 DIAGNOSIS — J0100 Acute maxillary sinusitis, unspecified: Secondary | ICD-10-CM | POA: Diagnosis not present

## 2015-03-29 DIAGNOSIS — R05 Cough: Secondary | ICD-10-CM | POA: Diagnosis not present

## 2015-03-29 MED ORDER — ALBUTEROL SULFATE HFA 108 (90 BASE) MCG/ACT IN AERS: 2.0000 | INHALATION_SPRAY | Freq: Four times a day (QID) | RESPIRATORY_TRACT | Status: DC | PRN

## 2015-03-29 MED ORDER — PREDNISONE 20 MG PO TABS: ORAL_TABLET | ORAL | Status: DC

## 2015-03-29 MED ORDER — METHYLPREDNISOLONE SODIUM SUCC 125 MG IJ SOLR
125.0000 mg | Freq: Once | INTRAMUSCULAR | Status: AC
  Administered 2015-03-29: 125 mg via INTRAMUSCULAR

## 2015-03-29 MED ORDER — ALBUTEROL SULFATE (2.5 MG/3ML) 0.083% IN NEBU
2.5000 mg | INHALATION_SOLUTION | Freq: Once | RESPIRATORY_TRACT | Status: AC
  Administered 2015-03-29: 2.5 mg via RESPIRATORY_TRACT

## 2015-03-29 NOTE — Progress Notes (Addendum)
Subjective:   This chart was scribed for Sherri Simmer, MD by [name], ED Scribe. This patient was seen in Room 1 and the patient's care was started at 8:00 PM.   Patient ID: Sherri Miller, female    DOB: 1976-05-19, 39 y.o.   MRN: [id number]  03/29/2015  Follow-up   HPI  HPI Comments: Sherri Miller is a 39 y.o. female who presents to the [facility] for a third follow up visit for a gradually worsening, harsh, intermittent cough that has been going on for 2 weeks. She is having associated chills, nasal congestion, mild sneezing, HA, sinus pressure, and generalized myalgias. She notes she has ear pressure but denies otalgia. She is experiencing chest pain and R side pain due to the excessive cough. Pt notes when she blows her nose it is consistent of green and white to clear sputum. She states it is not thick.Pt was seen on 4/18 by Dr. Perrin Maltese complaining of a cough that was keeping her up at night. She had a CBC done and she had WBC of 9.9. She a CXR that came back normal. She was put on zithromax 500 mg daily and a higher dose hydrocodone cough syrup. She notes she has also been taking Mucinex D and Tessalon pearls with no relief. She denies fever, wheezing, SOB, sore throat, nausea or vomiting.  She is unable to sleep due to the cough.  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   Review of Systems  Constitutional: Positive for chills and fatigue. Negative for fever and diaphoresis.  HENT: Positive for congestion, rhinorrhea, sinus pressure and sneezing. Negative for ear pain and sore throat.   Respiratory: Positive for cough. Negative for shortness of breath and wheezing.   Cardiovascular: Positive for chest pain (due to excessive cough).  Gastrointestinal: Negative for nausea, vomiting and diarrhea.  Musculoskeletal: Positive for myalgias.  Skin: Negative for rash.  Neurological: Positive for headaches.    Past Medical History  Diagnosis Date  . Migraine   . Allergy     History reviewed. No pertinent past surgical history. Allergies  Allergen Reactions  . Codeine Nausea And Vomiting  . Penicillins Hives        Objective:    Triage Vitals: BP 106/80 mmHg  Pulse 105  Temp(Src) 98.1 F (36.7 C) (Oral)  Resp 16  Ht  (1.727 m)  Wt 225 lb 8 oz (102.286 kg)  BMI 34.30 kg/m2  SpO2 98%  Physical Exam  Constitutional: She is oriented to person, place, and time. She appears well-developed and well-nourished. No distress.  HENT:  Head: Normocephalic and atraumatic.  Right Ear: External ear normal.  Left Ear: External ear normal.  Nose: Mucosal edema present.  Mouth/Throat: Posterior oropharyngeal erythema (diffusely) present. No oropharyngeal exudate.  Eyes: Conjunctivae and EOM are normal.  Neck: Neck supple. No tracheal deviation present.  Cardiovascular: Normal rate, regular rhythm and normal heart sounds.   No murmur heard. Pulmonary/Chest: Effort normal and breath sounds normal. No respiratory distress. She has no decreased breath sounds. She has no wheezes. She has no rhonchi. She has no rales.  Harsh hacking cough throughout exam.  Musculoskeletal: Normal range of motion.  Lymphadenopathy:    She has no cervical adenopathy.  Neurological: She is alert and oriented to person, place, and time.  Skin: Skin is warm and dry. She is not diaphoretic.  Psychiatric: She has a normal mood and affect. Her behavior is normal.  Nursing note and vitals reviewed.  SOLUMEDROL 125MG  IM ADMINISTERED.  ALBUTEROL NEBULIZER ADMINISTERED.    Assessment & Plan:   1. Acute maxillary sinusitis, recurrence not specified   2. Cough    -Unchanged.  Cough is most severe symptom. -s/p Solumedrol 125mg  IM in office; rx for Prednisone taper provided. -Recommend Albuterol HFA every six hours scheduled for next week. -Continue Mucinex D and Hydrocodone cough syrup and Tessalon Perles. -If no improvement in 3-5 days, call for Doxycycline or Levaquin  therapy.  Suffering with moderate nasal congestion. -Continue Atrovent nasal spray for nasal congestion.   Meds ordered this encounter  Medications  . albuterol (PROVENTIL) (2.5 MG/3ML) 0.083% nebulizer solution 2.5 mg    Sig:   . methylPREDNISolone sodium succinate (SOLU-MEDROL) 125 mg/2 mL injection 125 mg    Sig:   . predniSONE (DELTASONE) 20 MG tablet    Sig: Three tablets daily x 1 day, then two tablets daily x 5 days, then one tablet daily x 5 days    Dispense:  18 tablet    Refill:  0  . albuterol (PROVENTIL HFA;VENTOLIN HFA) 108 (90 BASE) MCG/ACT inhaler    Sig: Inhale 2 puffs into the lungs every 6 (six) hours as needed for wheezing or shortness of breath.    Dispense:  1 Inhaler    Refill:  0    No Follow-up on file.   I personally performed the services described in this documentation, which was scribed in my presence. The recorded information has been reviewed and considered.  Cray Monnin Paulita FujitaMartin Aquiles Ruffini, M.D. [facility]  [place] [address] [place], [place]  [id number] [phone] phone [phone]) [phone] fax

## 2015-04-06 ENCOUNTER — Telehealth: Payer: Self-pay | Admitting: Radiology

## 2015-04-06 ENCOUNTER — Other Ambulatory Visit: Payer: Self-pay | Admitting: Family Medicine

## 2015-04-06 ENCOUNTER — Telehealth: Payer: Self-pay

## 2015-04-06 MED ORDER — ALBUTEROL SULFATE HFA 108 (90 BASE) MCG/ACT IN AERS
2.0000 | INHALATION_SPRAY | Freq: Four times a day (QID) | RESPIRATORY_TRACT | Status: DC | PRN
Start: 2015-04-06 — End: 2015-04-06

## 2015-04-06 MED ORDER — PROMETHAZINE-DM 6.25-15 MG/5ML PO SYRP: 5.0000 mL | ORAL_SOLUTION | Freq: Four times a day (QID) | ORAL | Status: DC | PRN

## 2015-04-06 MED ORDER — ALBUTEROL SULFATE HFA 108 (90 BASE) MCG/ACT IN AERS: 2.0000 | INHALATION_SPRAY | Freq: Four times a day (QID) | RESPIRATORY_TRACT | Status: DC | PRN

## 2015-04-06 NOTE — Telephone Encounter (Signed)
Insurance will not cover Proventil HFA. They will cover ProAir or Ventolin. Can we change Rx?

## 2015-04-06 NOTE — Telephone Encounter (Signed)
Pt.notified

## 2015-04-06 NOTE — Telephone Encounter (Signed)
Pt is requesting more cough medicine. She is still coughing a lot.

## 2015-04-06 NOTE — Telephone Encounter (Signed)
Rx for Pro air sent to pharmacy

## 2015-04-06 NOTE — Telephone Encounter (Signed)
Meds ordered this encounter  Medications  . promethazine-dextromethorphan (PROMETHAZINE-DM) 6.25-15 MG/5ML syrup    Sig: Take 5 mLs by mouth 4 (four) times daily as needed for cough.    Dispense:  118 mL    Refill:  0    Order Specific Question:  Supervising Provider    Answer:  DOOLITTLE, ROBERT P [[id number]]    

## 2015-04-08 ENCOUNTER — Other Ambulatory Visit: Payer: Self-pay | Admitting: Internal Medicine

## 2015-04-08 DIAGNOSIS — J209 Acute bronchitis, unspecified: Secondary | ICD-10-CM

## 2015-04-08 DIAGNOSIS — R059 Cough, unspecified: Secondary | ICD-10-CM

## 2015-04-08 DIAGNOSIS — R05 Cough: Secondary | ICD-10-CM

## 2015-04-08 DIAGNOSIS — R5382 Chronic fatigue, unspecified: Secondary | ICD-10-CM

## 2015-04-08 DIAGNOSIS — R6883 Chills (without fever): Secondary | ICD-10-CM

## 2015-04-08 MED ORDER — HYDROCODONE-ACETAMINOPHEN 7.5-325 MG/15ML PO SOLN: 5.0000 mL | Freq: Four times a day (QID) | ORAL | Status: DC | PRN

## 2015-04-08 NOTE — Addendum Note (Signed)
Addended byDonnajean Lopes: Danaysha Kirn M on: 04/08/2015 01:43 PM   Modules accepted: Orders

## 2015-04-08 NOTE — Telephone Encounter (Signed)
I have refilled her cough medicine. [facility]. I refilled a smaller bottle of the medication this time. She will need to come to clinic if she wants any further refills. If her cough persists despite this treatment she should come back to clinic for further work up.

## 2015-04-09 NOTE — Telephone Encounter (Signed)
Pt notified and is mostly better. Just needing cough syrup at night to help her sleep.

## 2015-04-21 ENCOUNTER — Ambulatory Visit (INDEPENDENT_AMBULATORY_CARE_PROVIDER_SITE_OTHER): Payer: 59 | Admitting: Family Medicine

## 2015-04-21 VITALS — BP 120/74 | HR 80 | Temp 98.8°F | Resp 18 | Ht 68.0 in | Wt 226.4 lb

## 2015-04-21 DIAGNOSIS — R05 Cough: Secondary | ICD-10-CM

## 2015-04-21 DIAGNOSIS — J209 Acute bronchitis, unspecified: Secondary | ICD-10-CM | POA: Diagnosis not present

## 2015-04-21 DIAGNOSIS — R059 Cough, unspecified: Secondary | ICD-10-CM

## 2015-04-21 MED ORDER — HYDROCOD POLST-CPM POLST ER 10-8 MG/5ML PO SUER: 5.0000 mL | Freq: Two times a day (BID) | ORAL | Status: DC | PRN

## 2015-04-21 MED ORDER — MOMETASONE FURO-FORMOTEROL FUM 200-5 MCG/ACT IN AERO: 2.0000 | INHALATION_SPRAY | Freq: Two times a day (BID) | RESPIRATORY_TRACT | Status: DC

## 2015-04-21 NOTE — Patient Instructions (Addendum)
This violent cough is typical for a postviral inflammation. This sometimes takes some fairly strong cough medicine to stop the cyclical coughing  Acute Bronchitis Bronchitis is inflammation of the airways that extend from the windpipe into the lungs (bronchi). The inflammation often causes mucus to develop. This leads to a cough, which is the most common symptom of bronchitis.  In acute bronchitis, the condition usually develops suddenly and goes away over time, usually in a couple weeks. Smoking, allergies, and asthma can make bronchitis worse. Repeated episodes of bronchitis may cause further lung problems.  CAUSES Acute bronchitis is most often caused by the same virus that causes a cold. The virus can spread from person to person (contagious) through coughing, sneezing, and touching contaminated objects. SIGNS AND SYMPTOMS   Cough.   Fever.   Coughing up mucus.   Body aches.   Chest congestion.   Chills.   Shortness of breath.   Sore throat.  DIAGNOSIS  Acute bronchitis is usually diagnosed through a physical exam. Your health care provider will also ask you questions about your medical history. Tests, such as chest X-rays, are sometimes done to rule out other conditions.  TREATMENT  Acute bronchitis usually goes away in a couple weeks. Oftentimes, no medical treatment is necessary. Medicines are sometimes given for relief of fever or cough. Antibiotic medicines are usually not needed but may be prescribed in certain situations. In some cases, an inhaler may be recommended to help reduce shortness of breath and control the cough. A cool mist vaporizer may also be used to help thin bronchial secretions and make it easier to clear the chest.  HOME CARE INSTRUCTIONS  Get plenty of rest.   Drink enough fluids to keep your urine clear or pale yellow (unless you have a medical condition that requires fluid restriction). Increasing fluids may help thin your respiratory  secretions (sputum) and reduce chest congestion, and it will prevent dehydration.   Take medicines only as directed by your health care provider.  If you were prescribed an antibiotic medicine, finish it all even if you start to feel better.  Avoid smoking and secondhand smoke. Exposure to cigarette smoke or irritating chemicals will make bronchitis worse. If you are a smoker, consider using nicotine gum or skin patches to help control withdrawal symptoms. Quitting smoking will help your lungs heal faster.   Reduce the chances of another bout of acute bronchitis by washing your hands frequently, avoiding people with cold symptoms, and trying not to touch your hands to your mouth, nose, or eyes.   Keep all follow-up visits as directed by your health care provider.  SEEK MEDICAL CARE IF: Your symptoms do not improve after 1 week of treatment.  SEEK IMMEDIATE MEDICAL CARE IF:  You develop an increased fever or chills.   You have chest pain.   You have severe shortness of breath.  You have bloody sputum.   You develop dehydration.  You faint or repeatedly feel like you are going to pass out.  You develop repeated vomiting.  You develop a severe headache. MAKE SURE YOU:   Understand these instructions.  Will watch your condition.  Will get help right away if you are not doing well or get worse. Document Released: 01/01/2005 Document Revised: 04/10/2014 Document Reviewed: 05/17/2013 [facility] Patient Information 2015 [place] [place]. This information is not intended to replace advice given to you by your health care provider. Make sure you discuss any questions you have with your health care provider.

## 2015-04-21 NOTE — Progress Notes (Signed)
° °  Subjective:    Patient ID: Sherri Miller, female    DOB: [birth year], 39 y.o.   MRN: [id number] This chart was scribed for Elvina SidleKurt Lauenstein, MD by Littie Deedsichard Sun, Medical Scribe. This patient was seen in Room 14 and the patient's care was started at 11:49 AM.   HPI HPI Comments: Sherri Miller is a 39 y.o. female who presents to the [facility] for a follow-up for gradual onset, intermittent cough that started a month ago. The cough has not improved or resolved. It has caused her some trouble sleeping. The cough is productive in the morning. Her cough is worst in the morning and at night. Patient has been seen here 3 times for this prior to today; first with Lanier ClamNicole Bush, PA-C, then with Dr. Perrin MalteseGuest, and most recently with Dr. Katrinka BlazingSmith. She did have a CXR done. Patient had been tried on two rounds of antibiotics, albuterol, and steroids. She has also been using Zyrtec, Claritin and Mucinex but without relief. The steroids and albuterol did not provide her relief. Patient denies hx of asthma and smoking. She states she has never had a cough this bad before. Patient has an upcoming trip to the beach.  Note from last visit with Dr. Katrinka BlazingSmith on 03/29/15: Sherri Miller is a 39 y.o. female who presents to the [facility] for a third follow up visit for a gradually worsening, harsh, intermittent cough that has been going on for 2 weeks. She is having associated chills, nasal congestion, mild sneezing, HA, sinus pressure, and generalized myalgias. She notes she has ear pressure but denies otalgia. She is experiencing chest pain and R side pain due to the excessive cough. Pt notes when she blows her nose it is consistent of green and white to clear sputum. She states it is not thick.Pt was seen on 4/18 by Dr. Perrin MalteseGuest complaining of a cough that was keeping her up at night. She had a CBC done and she had WBC of 9.9. She a CXR that came back normal. She was put on zithromax 500 mg daily and a  higher dose hydrocodone cough syrup. She notes she has also been taking Mucinex D and Tessalon pearls with no relief. She denies fever, wheezing, SOB, sore throat, nausea or vomiting. She is unable to sleep due to the cough. 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   Review of Systems  Respiratory: Positive for cough.        Objective:   Physical Exam CONSTITUTIONAL: Well developed/well nourished HEAD: Normocephalic/atraumatic EYES: EOM/PERRL ENMT: Mucous membranes moist NECK: supple no meningeal signs SPINE: entire spine nontender CV: S1/S2 noted, no murmurs/rubs/gallops noted LUNGS: Lungs are clear to auscultation bilaterally, no apparent distress ABDOMEN: soft, nontender, no rebound or guarding GU: no cva tenderness NEURO: Pt is awake/alert, moves all extremitiesx4 EXTREMITIES: pulses normal, full ROM SKIN: warm, color normal PSYCH: no abnormalities of mood noted  Persistent violent cough which is nonproductive during the office visit.    Assessment & Plan:   This chart was scribed in my presence and reviewed by me personally.    ICD-9-CM ICD-10-CM   1. Cough 786.2 R05 chlorpheniramine-HYDROcodone (TUSSIONEX PENNKINETIC ER) 10-8 MG/5ML SUER     mometasone-formoterol (DULERA) 200-5 MCG/ACT AERO  2. Acute bronchitis, unspecified organism 466.0 J20.9 mometasone-formoterol (DULERA) 200-5 MCG/ACT AERO   This persistent cough is typical for postviral inflammation bronchitis.  Signed, Elvina SidleKurt Lauenstein, MD

## 2015-07-14 ENCOUNTER — Ambulatory Visit (INDEPENDENT_AMBULATORY_CARE_PROVIDER_SITE_OTHER): Payer: 59 | Admitting: Family Medicine

## 2015-07-14 VITALS — BP 132/80 | HR 95 | Temp 98.0°F | Resp 17 | Ht 69.0 in | Wt 232.0 lb

## 2015-07-14 DIAGNOSIS — R05 Cough: Secondary | ICD-10-CM

## 2015-07-14 DIAGNOSIS — Z634 Disappearance and death of family member: Secondary | ICD-10-CM | POA: Diagnosis not present

## 2015-07-14 DIAGNOSIS — G4700 Insomnia, unspecified: Secondary | ICD-10-CM

## 2015-07-14 DIAGNOSIS — J209 Acute bronchitis, unspecified: Secondary | ICD-10-CM | POA: Diagnosis not present

## 2015-07-14 DIAGNOSIS — F411 Generalized anxiety disorder: Secondary | ICD-10-CM

## 2015-07-14 DIAGNOSIS — R059 Cough, unspecified: Secondary | ICD-10-CM

## 2015-07-14 MED ORDER — LEVOFLOXACIN 500 MG PO TABS: 500.0000 mg | ORAL_TABLET | Freq: Every day | ORAL | Status: DC

## 2015-07-14 MED ORDER — CLONAZEPAM 0.5 MG PO TABS: ORAL_TABLET | ORAL | Status: DC

## 2015-07-14 MED ORDER — HYDROCOD POLST-CPM POLST ER 10-8 MG/5ML PO SUER: 5.0000 mL | Freq: Two times a day (BID) | ORAL | Status: DC | PRN

## 2015-07-14 NOTE — Patient Instructions (Signed)
Acute Bronchitis °Bronchitis is inflammation of the airways that extend from the windpipe into the lungs (bronchi). The inflammation often causes mucus to develop. This leads to a cough, which is the most common symptom of bronchitis.  °In acute bronchitis, the condition usually develops suddenly and goes away over time, usually in a couple weeks. Smoking, allergies, and asthma can make bronchitis worse. Repeated episodes of bronchitis may cause further lung problems.  °CAUSES °Acute bronchitis is most often caused by the same virus that causes a cold. The virus can spread from person to person (contagious) through coughing, sneezing, and touching contaminated objects. °SIGNS AND SYMPTOMS  °· Cough.   °· Fever.   °· Coughing up mucus.   °· Body aches.   °· Chest congestion.   °· Chills.   °· Shortness of breath.   °· Sore throat.   °DIAGNOSIS  °Acute bronchitis is usually diagnosed through a physical exam. Your health care provider will also ask you questions about your medical history. Tests, such as chest X-rays, are sometimes done to rule out other conditions.  °TREATMENT  °Acute bronchitis usually goes away in a couple weeks. Oftentimes, no medical treatment is necessary. Medicines are sometimes given for relief of fever or cough. Antibiotic medicines are usually not needed but may be prescribed in certain situations. In some cases, an inhaler may be recommended to help reduce shortness of breath and control the cough. A cool mist vaporizer may also be used to help thin bronchial secretions and make it easier to clear the chest.  °HOME CARE INSTRUCTIONS °· Get plenty of rest.   °· Drink enough fluids to keep your urine clear or pale yellow (unless you have a medical condition that requires fluid restriction). Increasing fluids may help thin your respiratory secretions (sputum) and reduce chest congestion, and it will prevent dehydration.   °· Take medicines only as directed by your health care provider. °· If  you were prescribed an antibiotic medicine, finish it all even if you start to feel better. °· Avoid smoking and secondhand smoke. Exposure to cigarette smoke or irritating chemicals will make bronchitis worse. If you are a smoker, consider using nicotine gum or skin patches to help control withdrawal symptoms. Quitting smoking will help your lungs heal faster.   °· Reduce the chances of another bout of acute bronchitis by washing your hands frequently, avoiding people with cold symptoms, and trying not to touch your hands to your mouth, nose, or eyes.   °· Keep all follow-up visits as directed by your health care provider.   °SEEK MEDICAL CARE IF: °Your symptoms do not improve after 1 week of treatment.  °SEEK IMMEDIATE MEDICAL CARE IF: °· You develop an increased fever or chills.   °· You have chest pain.   °· You have severe shortness of breath. °· You have bloody sputum.   °· You develop dehydration. °· You faint or repeatedly feel like you are going to pass out. °· You develop repeated vomiting. °· You develop a severe headache. °MAKE SURE YOU:  °· Understand these instructions. °· Will watch your condition. °· Will get help right away if you are not doing well or get worse. °Document Released: 01/01/2005 Document Revised: 04/10/2014 Document Reviewed: 05/17/2013 °Exit[employer]® Patient Information ©2015 [employer]. This information is not intended to replace advice given to you by your health care provider. Make sure you discuss any questions you have with your health care provider. ° °

## 2015-07-14 NOTE — Progress Notes (Signed)
Chief Complaint:  Chief Complaint  Patient presents with  . Cough  . URI    HPI: Sherri Miller is a 39 y.o. female who reports to [facility] today complaining of 3-4 days of illness, she is very anxious this will turn out like last time where she had to come in 3-4 times and it progressively got worse. 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 She also [place] very stressed and this all began the same time she found out her exboyfriend and best fried killed himself, she has ahd a 4 day history of sinuses draiange, cough sore throat. Denies fevers or chills, wheezing, nv.abd pain, SOB, she has CP when she thinks about her friend but  not with cough She has allergies and has tried allergy med, has tried prevacid, wants to get ahead of this. 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  Draining cough, has some anxiety symptoms, but denies any SI.Hi/hallucinations  Past Medical History  Diagnosis Date  . Migraine   . Allergy    No past surgical history on file. History   Social History  . Marital Status: Single    Spouse Name: N/A  . Number of Children: N/A  . Years of Education: N/A   Social History Main Topics  . Smoking status: Never Smoker   . Smokeless tobacco: Never Used  . Alcohol Use: No  . Drug Use: No  . Sexual Activity: Yes    Birth Control/ Protection: Pill   Other Topics Concern  . None   Social History Narrative   No family history on file. Allergies  Allergen Reactions  . Codeine Nausea And Vomiting  . Penicillins Hives   Prior to Admission medications   Medication Sig Start Date End Date Taking? Authorizing Provider  drospirenone-ethinyl estradiol (YAZ,GIANVI,LORYNA) 3-0.02 MG tablet Take 1 tablet by mouth daily.   Yes Historical Provider, MD  gabapentin (NEURONTIN)  600 MG tablet Take 600 mg by mouth 3 (three) times daily.   Yes Historical Provider, MD  ondansetron (ZOFRAN) 8 MG tablet Take 8 mg by mouth every 8 (eight) hours as needed for nausea.    Yes Linus Salmons, MD  rizatriptan (MAXALT-MLT) 10 MG disintegrating tablet Take 10 mg by mouth as needed for migraine. May repeat in 2 hours if needed   Yes Historical Provider, MD     ROS: The patient denies fevers, chills, night sweats, unintentional weight loss, chest pain, palpitations, wheezing, dyspnea on exertion, nausea, vomiting, abdominal pain, dysuria, hematuria, melena, numbness, weakness, or tingling.   All other systems have been reviewed and were otherwise negative with the exception of those mentioned in the HPI and as above.    PHYSICAL EXAM: Filed Vitals:   07/14/15 0844  BP: 132/80  Pulse: 95  Temp: 98 F (36.7 C)  Resp: 17   Body mass index is 34.24 kg/(m^2).   General: Alert, no acute distress HEENT:  Normocephalic, atraumatic, oropharynx patent. EOMI, PERRLA Erythematous throat, no exudates, TM normal, neg sinus tenderness, + erythematous/boggy nasal mucosa Cardiovascular:  Regular rate and rhythm, no rubs murmurs or gallops.  No pedal edema.  Respiratory: Clear to auscultation bilaterally.  No wheezes, rales, or rhonchi.  No cyanosis, no use of accessory musculature Abdominal:  No organomegaly, abdomen is soft and non-tender, positive bowel sounds. No masses. Skin: No rashes. Neurologic: Facial musculature symmetric. Psychiatric: Patient acts appropriately throughout our interaction. Lymphatic: No cervical or submandibular lymphadenopathy Musculoskeletal: Gait intact. No edema, tenderness   LABS: Results for orders placed or performed in visit on 03/26/15  POCT CBC  Result Value Ref Range   WBC 9.9 4.6 - 10.2 K/uL   Lymph, poc 2.1 0.6 - 3.4   POC LYMPH PERCENT 21.4 10 - 50 %L   MID (cbc) 0.6 0 - 0.9   POC MID % 5.6 0 - 12 %M   POC Granulocyte 7.2 (A) 2 - 6.9    Granulocyte percent 73.0 37 - 80 %G   RBC 4.51 4.04 - 5.48 M/uL   Hemoglobin 12.5 12.2 - 16.2 g/dL   HCT, POC 16.1 (A) 09.6 - 47.9 %   MCV 82.9 80 - 97 fL   MCH, POC 27.7 27 - 31.2 pg   MCHC 33.5 31.8 - 35.4 g/dL   RDW, POC 04.5 %   Platelet Count, POC 259 142 - 424 K/uL   MPV 7.5 0 - 99.8 fL     EKG/XRAY:   Primary read interpreted by Dr. Conley Rolls at [facility].   ASSESSMENT/PLAN: Encounter Diagnoses  Name Primary?  . Acute bronchitis, unspecified organism Yes  . Cough   . Anxiety state   . Bereavement   . Insomnia    otc thraot lozenges, tessalon does not work so does not want it Cont with inhalers prn Rx levaquin, tussionex, and also clonazepam prn  Cont with giref counseling, the Clonazepam is just prn and also is short term, patient understands, she declines to take anything daily Fu in 1 month prn   Gross sideeffects, risk and benefits, and alternatives of medications d/w patient. Patient is aware that all medications have potential sideeffects and we are unable to predict every sideeffect or drug-drug interaction that may occur.  Mykalah Saari DO  07/14/2015 10:03 AM

## 2015-12-12 MED FILL — ESCITALOPRAM 10 MG TABLET: 10 | 30 days supply | Qty: 30 | Fill #1

## 2015-12-12 MED FILL — GABAPENTIN 300 MG CAPSULE: 300 | 30 days supply | Qty: 90 | Fill #0

## 2016-01-17 MED FILL — ESCITALOPRAM 10 MG TABLET: 10 | 30 days supply | Qty: 30 | Fill #2

## 2016-01-17 MED FILL — LORYNA 3-0.02 MG TAB: 3-0.02 | 62 days supply | Qty: 84 | Fill #3

## 2016-01-17 MED FILL — GABAPENTIN 300 MG CAPSULE: 300 | 30 days supply | Qty: 90 | Fill #1

## 2016-01-25 MED FILL — HYDROCODON-APAP 5-325: 5-325 | 8 days supply | Qty: 30 | Fill #0

## 2016-01-25 MED FILL — METHOCARBAMOL 500 MG TABLET: 500 | 20 days supply | Qty: 40 | Fill #0

## 2016-03-02 ENCOUNTER — Telehealth: Payer: Commercial Managed Care - PPO | Admitting: Family

## 2016-03-02 DIAGNOSIS — N390 Urinary tract infection, site not specified: Secondary | ICD-10-CM

## 2016-03-02 MED ORDER — SULFAMETHOXAZOLE-TRIMETHOPRIM 800-160 MG PO TABS: 1.0000 | ORAL_TABLET | Freq: Two times a day (BID) | ORAL | Status: DC

## 2016-03-02 NOTE — Progress Notes (Signed)

## 2016-03-03 MED FILL — SULFAMETHOXAZOLE/TMP DS TAB: 800-160 | 5 days supply | Qty: 10 | Fill #0

## 2016-03-03 MED FILL — GABAPENTIN 300 MG CAPSULE: 300 | 30 days supply | Qty: 90 | Fill #2

## 2016-03-03 MED FILL — ESCITALOPRAM 10 MG TABLET: 10 | 30 days supply | Qty: 30 | Fill #0

## 2016-03-14 DIAGNOSIS — F411 Generalized anxiety disorder: Secondary | ICD-10-CM | POA: Diagnosis not present

## 2016-03-14 DIAGNOSIS — G43019 Migraine without aura, intractable, without status migrainosus: Secondary | ICD-10-CM | POA: Diagnosis not present

## 2016-03-14 DIAGNOSIS — G43829 Menstrual migraine, not intractable, without status migrainosus: Secondary | ICD-10-CM | POA: Diagnosis not present

## 2016-03-14 MED FILL — BUPROPION HCL SR 150 MG TAB: 150 | 30 days supply | Qty: 60 | Fill #0

## 2016-03-14 MED FILL — CYCLOBENZAPRINE 10 MG TAB: 10 | 30 days supply | Qty: 90 | Fill #0

## 2016-03-14 MED FILL — RIZATRIPTAN 10 MG ODT: 10 | 30 days supply | Qty: 9 | Fill #0

## 2016-04-03 MED FILL — METHOCARBAMOL 500 MG TABLET: 500 | 20 days supply | Qty: 40 | Fill #0

## 2016-04-03 MED FILL — GABAPENTIN 300 MG CAPSULE: 300 | 30 days supply | Qty: 90 | Fill #3

## 2016-04-03 MED FILL — HYDROCODON-APAP 5-325: 5-325 | 15 days supply | Qty: 60 | Fill #0

## 2016-04-08 DIAGNOSIS — Z124 Encounter for screening for malignant neoplasm of cervix: Secondary | ICD-10-CM | POA: Diagnosis not present

## 2016-04-08 DIAGNOSIS — Z1151 Encounter for screening for human papillomavirus (HPV): Secondary | ICD-10-CM | POA: Diagnosis not present

## 2016-04-08 DIAGNOSIS — Z3041 Encounter for surveillance of contraceptive pills: Secondary | ICD-10-CM | POA: Diagnosis not present

## 2016-04-08 DIAGNOSIS — Z01419 Encounter for gynecological examination (general) (routine) without abnormal findings: Secondary | ICD-10-CM | POA: Diagnosis not present

## 2016-04-08 LAB — HM PAP SMEAR: HM Pap smear: NEGATIVE

## 2016-04-08 MED FILL — LORYNA 3-0.02 MG TAB: 3-0.02 | 72 days supply | Qty: 84 | Fill #0

## 2016-04-16 MED FILL — RIZATRIPTAN 10 MG ODT: 10 | 30 days supply | Qty: 9 | Fill #1

## 2016-04-16 MED FILL — BUPROPION HCL SR 150 MG TAB: 150 | 30 days supply | Qty: 60 | Fill #1

## 2016-04-18 LAB — RESULTS CONSOLE HPV: CHL HPV: NEGATIVE

## 2016-04-25 ENCOUNTER — Telehealth: Payer: Commercial Managed Care - PPO | Admitting: Family

## 2016-04-25 DIAGNOSIS — B9689 Other specified bacterial agents as the cause of diseases classified elsewhere: Secondary | ICD-10-CM

## 2016-04-25 DIAGNOSIS — J069 Acute upper respiratory infection, unspecified: Secondary | ICD-10-CM

## 2016-04-25 MED ORDER — DOXYCYCLINE HYCLATE 100 MG PO TABS: 100.0000 mg | ORAL_TABLET | Freq: Two times a day (BID) | ORAL | Status: DC

## 2016-04-25 NOTE — Progress Notes (Signed)

## 2016-05-07 MED FILL — GABAPENTIN 300 MG CAPSULE: 300 | 30 days supply | Qty: 90 | Fill #4

## 2016-05-07 MED FILL — RIZATRIPTAN 10 MG ODT: 10 | 30 days supply | Qty: 9 | Fill #2

## 2016-05-26 MED FILL — BUPROPION HCL SR 150 MG TAB: 150 | 30 days supply | Qty: 60 | Fill #2

## 2016-06-06 MED FILL — METHOCARBAMOL 500 MG TABLET: 500 | 20 days supply | Qty: 40 | Fill #0

## 2016-06-06 MED FILL — RIZATRIPTAN 10 MG ODT: 10 | 30 days supply | Qty: 9 | Fill #3

## 2016-06-06 MED FILL — LORYNA 3-0.02 MG TAB: 3-0.02 | 72 days supply | Qty: 84 | Fill #1 | Status: TO

## 2016-06-06 MED FILL — GABAPENTIN 300 MG CAPSULE: 300 | 30 days supply | Qty: 90 | Fill #5

## 2016-07-04 MED FILL — BUPROPION SR 150 MG TABLET: 150 | 30 days supply | Qty: 60 | Fill #3

## 2016-07-04 MED FILL — GABAPENTIN 300 MG CAPSULE: 300 | 30 days supply | Qty: 90 | Fill #6

## 2016-07-04 MED FILL — RIZATRIPTAN 10 MG ODT: 10 | 30 days supply | Qty: 9 | Fill #4

## 2016-07-23 DIAGNOSIS — H5203 Hypermetropia, bilateral: Secondary | ICD-10-CM | POA: Diagnosis not present

## 2016-07-23 DIAGNOSIS — H524 Presbyopia: Secondary | ICD-10-CM | POA: Diagnosis not present

## 2016-07-23 DIAGNOSIS — H52221 Regular astigmatism, right eye: Secondary | ICD-10-CM | POA: Diagnosis not present

## 2016-07-23 DIAGNOSIS — H11152 Pinguecula, left eye: Secondary | ICD-10-CM | POA: Diagnosis not present

## 2016-08-07 MED FILL — FLUCONAZOLE 150 MG TABLET: 150 | 7 days supply | Qty: 2 | Fill #0

## 2016-08-07 MED FILL — RIZATRIPTAN 10 MG ODT: 10 | 30 days supply | Qty: 9 | Fill #5

## 2016-09-12 DIAGNOSIS — G43019 Migraine without aura, intractable, without status migrainosus: Secondary | ICD-10-CM | POA: Diagnosis not present

## 2016-09-12 DIAGNOSIS — G43829 Menstrual migraine, not intractable, without status migrainosus: Secondary | ICD-10-CM | POA: Diagnosis not present

## 2016-09-12 DIAGNOSIS — F411 Generalized anxiety disorder: Secondary | ICD-10-CM | POA: Diagnosis not present

## 2016-09-12 MED FILL — HYDROCODON-APAP 5-325: 5-325 | 15 days supply | Qty: 60 | Fill #0

## 2016-09-12 MED FILL — RIZATRIPTAN 10 MG ODT: 10 | 30 days supply | Qty: 9 | Fill #6

## 2016-09-12 MED FILL — GABAPENTIN 600 MG TABLET: 600 | 90 days supply | Qty: 270 | Fill #0

## 2016-09-12 MED FILL — RIZATRIPTAN 10 MG TABLET: 10 | 30 days supply | Qty: 9 | Fill #0

## 2016-09-12 MED FILL — PHENADOZ 25 MG SUPP: 25 | 1 days supply | Qty: 4 | Fill #0

## 2016-11-06 MED FILL — RIZATRIPTAN 10 MG ODT: 10 | 30 days supply | Qty: 9 | Fill #7

## 2016-11-06 MED FILL — RIZATRIPTAN 10 MG TABLET: 10 | 30 days supply | Qty: 9 | Fill #1

## 2016-11-13 ENCOUNTER — Telehealth: Payer: Commercial Managed Care - PPO | Admitting: Physician Assistant

## 2016-11-13 DIAGNOSIS — J208 Acute bronchitis due to other specified organisms: Secondary | ICD-10-CM

## 2016-11-13 DIAGNOSIS — B9689 Other specified bacterial agents as the cause of diseases classified elsewhere: Secondary | ICD-10-CM

## 2016-11-13 MED ORDER — BENZONATATE 100 MG PO CAPS: 100.0000 mg | ORAL_CAPSULE | Freq: Three times a day (TID) | ORAL | 0 refills | Status: DC | PRN

## 2016-11-13 MED ORDER — DOXYCYCLINE HYCLATE 100 MG PO CAPS: 100.0000 mg | ORAL_CAPSULE | Freq: Two times a day (BID) | ORAL | 0 refills | Status: DC

## 2016-11-13 NOTE — Progress Notes (Signed)
We are sorry that you are not feeling well.  Here is how we plan to help!  Based on what you have shared with me it looks like you have upper respiratory tract inflammation that has resulted in a significant cough.  Inflammation and infection in the upper respiratory tract is commonly called bronchitis and has four common causes:  Allergies, Viral Infections, Acid Reflux and Bacterial Infections.  Allergies, viruses and acid reflux are treated by controlling symptoms or eliminating the cause. An example might be a cough caused by taking certain blood pressure medications. You stop the cough by changing the medication. Another example might be a cough caused by acid reflux. Controlling the reflux helps control the cough.  Based on your presentation I believe you most likely have A cough due to bacteria.  When patients have a fever and a productive cough with a change in color or increased sputum production, we are concerned about bacterial bronchitis.  If left untreated it can progress to pneumonia.  If your symptoms do not improve with your treatment plan it is important that you contact your provider.   I hve prescribed Doxycycline 100 mg twice a day for 7 days     In addition you may use A prescription cough medication called Tessalon Perles 100mg. You may take 1-2 capsules every 8 hours as needed for your cough.    USE OF BRONCHODILATOR ("RESCUE") INHALERS: There is a risk from using your bronchodilator too frequently.  The risk is that over-reliance on a medication which only relaxes the muscles surrounding the breathing tubes can reduce the effectiveness of medications prescribed to reduce swelling and congestion of the tubes themselves.  Although you feel brief relief from the bronchodilator inhaler, your asthma may actually be worsening with the tubes becoming more swollen and filled with mucus.  This can delay other crucial treatments, such as oral steroid medications. If you need to use a  bronchodilator inhaler daily, several times per day, you should discuss this with your provider.  There are probably better treatments that could be used to keep your asthma under control.     HOME CARE . Only take medications as instructed by your medical team. . Complete the entire course of an antibiotic. . Drink plenty of fluids and get plenty of rest. . Avoid close contacts especially the very young and the elderly . Cover your mouth if you cough or cough into your sleeve. . Always remember to wash your hands . A steam or ultrasonic humidifier can help congestion.   GET HELP RIGHT AWAY IF: . You develop worsening fever. . You become short of breath . You cough up blood. . Your symptoms persist after you have completed your treatment plan MAKE SURE YOU   Understand these instructions.  Will watch your condition.  Will get help right away if you are not doing well or get worse.  Your e-visit answers were reviewed by a board certified advanced clinical practitioner to complete your personal care plan.  Depending on the condition, your plan could have included both over the counter or prescription medications. If there is a problem please reply  once you have received a response from your provider. Your safety is important to us.  If you have drug allergies check your prescription carefully.    You can use MyChart to ask questions about today's visit, request a non-urgent call back, or ask for a work or school excuse for 24 hours related to this e-Visit. If   it has been greater than 24 hours you will need to follow up with your provider, or enter a new e-Visit to address those concerns. You will get an e-mail in the next two days asking about your experience.  I hope that your e-visit has been valuable and will speed your recovery. Thank you for using e-visits.   

## 2016-12-12 ENCOUNTER — Other Ambulatory Visit: Payer: Self-pay | Admitting: Obstetrics and Gynecology

## 2016-12-12 DIAGNOSIS — Z1231 Encounter for screening mammogram for malignant neoplasm of breast: Secondary | ICD-10-CM

## 2016-12-25 ENCOUNTER — Institutional Professional Consult (permissible substitution): Payer: Commercial Managed Care - PPO | Admitting: Pulmonary Disease

## 2016-12-31 MED FILL — GABAPENTIN 600 MG TABLET: 600 | 90 days supply | Qty: 270 | Fill #1

## 2016-12-31 MED FILL — RIZATRIPTAN 10 MG TABLET: 10 | 30 days supply | Qty: 9 | Fill #2

## 2016-12-31 MED FILL — CYCLOBENZAPRINE 10 MG TAB: 10 | 30 days supply | Qty: 90 | Fill #1

## 2016-12-31 MED FILL — RIZATRIPTAN 10 MG ODT: 10 | 30 days supply | Qty: 9 | Fill #8

## 2017-01-07 ENCOUNTER — Telehealth: Payer: Self-pay

## 2017-01-07 NOTE — Telephone Encounter (Signed)
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 Evidently she has had a cough for 3 months that is not going away. Do you want to try to work her into your schedule or do you want me to?  J

## 2017-01-07 NOTE — Telephone Encounter (Signed)
Left message for patient to call back  

## 2017-01-08 ENCOUNTER — Ambulatory Visit
Admission: RE | Admit: 2017-01-08 | Discharge: 2017-01-08 | Disposition: A | Discharge status: Home / Self Care | Payer: 59 | Source: Ambulatory Visit | Attending: Obstetrics and Gynecology | Admitting: Obstetrics and Gynecology

## 2017-01-08 DIAGNOSIS — Z1231 Encounter for screening mammogram for malignant neoplasm of breast: Secondary | ICD-10-CM | POA: Diagnosis not present

## 2017-01-09 NOTE — Telephone Encounter (Signed)
Patient called back and scheduled appt for next month.

## 2017-01-29 MED FILL — DROSPIR-ETH ESTRA 3/.02 MG: 3-0.02 | 84 days supply | Qty: 112 | Fill #0

## 2017-02-24 ENCOUNTER — Institutional Professional Consult (permissible substitution): Payer: Self-pay | Admitting: Pulmonary Disease

## 2017-03-13 DIAGNOSIS — G43019 Migraine without aura, intractable, without status migrainosus: Secondary | ICD-10-CM | POA: Diagnosis not present

## 2017-03-13 DIAGNOSIS — F411 Generalized anxiety disorder: Secondary | ICD-10-CM | POA: Diagnosis not present

## 2017-03-13 DIAGNOSIS — G43829 Menstrual migraine, not intractable, without status migrainosus: Secondary | ICD-10-CM | POA: Diagnosis not present

## 2017-03-13 DIAGNOSIS — G43109 Migraine with aura, not intractable, without status migrainosus: Secondary | ICD-10-CM | POA: Diagnosis not present

## 2017-03-13 MED FILL — RIZATRIPTAN 10 MG ODT: 10 | 30 days supply | Qty: 9 | Fill #9

## 2017-03-13 MED FILL — HYDROCODON-APAP 5-325: 5-325 | 15 days supply | Qty: 60 | Fill #0

## 2017-03-13 MED FILL — PHENADOZ 25 MG SUPP: 25 | 1 days supply | Qty: 4 | Fill #1

## 2017-03-13 MED FILL — RIZATRIPTAN 10 MG TABLET: 10 | 30 days supply | Qty: 9 | Fill #3

## 2017-05-20 MED FILL — RIZATRIPTAN 10 MG TABLET: 10 | 20 days supply | Qty: 12 | Fill #0

## 2017-05-20 MED FILL — LORYNA 3-0.02 MG TAB: 3-0.02 | 72 days supply | Qty: 84 | Fill #0

## 2017-05-20 MED FILL — RIZATRIPTAN 10 MG ODT: 10 | 17 days supply | Qty: 10 | Fill #0

## 2017-05-20 MED FILL — GABAPENTIN 600 MG TAB: 600 | 90 days supply | Qty: 270 | Fill #2

## 2017-09-01 MED FILL — RIZATRIPTAN 10 MG TABLET: 10 | 20 days supply | Qty: 12 | Fill #1 | Status: TO

## 2017-09-01 MED FILL — RIZATRIPTAN 10 MG ODT: 10 | 17 days supply | Qty: 10 | Fill #1 | Status: TO

## 2017-10-15 DIAGNOSIS — Z01419 Encounter for gynecological examination (general) (routine) without abnormal findings: Secondary | ICD-10-CM | POA: Diagnosis not present

## 2017-10-15 DIAGNOSIS — Z3041 Encounter for surveillance of contraceptive pills: Secondary | ICD-10-CM | POA: Diagnosis not present

## 2017-10-15 DIAGNOSIS — F329 Major depressive disorder, single episode, unspecified: Secondary | ICD-10-CM | POA: Diagnosis not present

## 2017-10-15 DIAGNOSIS — N958 Other specified menopausal and perimenopausal disorders: Secondary | ICD-10-CM | POA: Diagnosis not present

## 2017-10-15 MED FILL — HYDROCODON-APAP 5-325: 5-325 | 15 days supply | Qty: 60 | Fill #0

## 2017-10-15 MED FILL — BUPROPION SR 150 MG TABLET: 150 | 90 days supply | Qty: 180 | Fill #0

## 2017-10-15 MED FILL — DROSPIR-ETH ESTRA 3/.02 MG: 3-0.02 | 63 days supply | Qty: 84 | Fill #0 | Status: TO

## 2018-01-01 MED FILL — DROSPIR-ETH ESTRA 3/.02 MG: 3-0.02 | 63 days supply | Qty: 84 | Fill #0 | Status: TO

## 2018-01-01 MED FILL — RIZATRIPTAN 10 MG ODT: 10 | 30 days supply | Qty: 10 | Fill #0 | Status: TO

## 2018-01-01 MED FILL — RIZATRIPTAN BENZOATE 10 MG: 10 | 30 days supply | Qty: 12 | Fill #0 | Status: TO

## 2018-01-27 ENCOUNTER — Encounter: Payer: Self-pay | Admitting: Nurse Practitioner

## 2018-01-27 ENCOUNTER — Ambulatory Visit: Payer: Self-pay | Admitting: Nurse Practitioner

## 2018-01-27 VITALS — BP 132/88 | HR 92 | Temp 98.4°F | Resp 16 | Wt 216.2 lb

## 2018-01-27 DIAGNOSIS — R6889 Other general symptoms and signs: Secondary | ICD-10-CM

## 2018-01-27 DIAGNOSIS — R509 Fever, unspecified: Secondary | ICD-10-CM

## 2018-01-27 LAB — POCT INFLUENZA A/B
INFLUENZA A, POC: NEGATIVE
INFLUENZA B, POC: NEGATIVE

## 2018-01-27 MED ORDER — HYDROCOD POLST-CPM POLST ER 10-8 MG/5ML PO SUER: 5.0000 mL | Freq: Two times a day (BID) | ORAL | 0 refills | Status: AC | PRN

## 2018-01-27 NOTE — Patient Instructions (Addendum)

## 2018-01-27 NOTE — Progress Notes (Signed)
Subjective:  Sherri Miller is a 42 y.o. female who presents for evaluation of influenza like symptoms.  Symptoms include achiness, headache described as throbbing, productive cough with yellow colored sputum and sore throat.  Onset of symptoms was 2 days ago, and has been gradually worsening since that time.  Treatment to date:  ibuprofen and Hydrocodone.  High risk factors for influenza complications:  patient was exposed to co-workers diagnosed with influenza.  The following portions of the patient's history were reviewed and updated as appropriate:  allergies, current medications and past medical history.  Constitutional: positive for chills, fatigue and malaise, negative for anorexia and sweats Eyes: negative Ears, nose, mouth, throat, and face: positive for nasal congestion and sore throat, negative for ear drainage, earaches and hoarseness Respiratory: positive for cough, negative for asthma, dyspnea on exertion, sputum, stridor and wheezing Cardiovascular: negative Gastrointestinal: negative Neurological: positive for headaches, negative for coordination problems, dizziness, paresthesia, seizures and weakness Objective:  BP 132/88 (BP Location: Right Arm, Patient Position: Sitting, Cuff Size: Normal)   Pulse 92   Temp 98.4 F (36.9 C) (Oral)   Resp 16   Wt 216 lb 3.2 oz (98.1 kg)   SpO2 98%   BMI 31.93 kg/m  General appearance: alert, cooperative, fatigued and no distress Head: Normocephalic, without obvious abnormality, atraumatic Eyes: conjunctivae/corneas clear. PERRL, EOM's intact. Fundi benign. Ears: abnormal TM right ear - mucoid middle ear fluid and abnormal TM left ear - mucoid middle ear fluid Nose: no discharge, turbinates swollen, inflamed, no sinus tenderness Throat: lips, mucosa, and tongue normal; teeth and gums normal Lungs: clear to auscultation bilaterally Heart: regular rate and rhythm, S1, S2 normal, no murmur, click, rub or gallop Abdomen: soft, non-tender;  bowel sounds normal; no masses,  no organomegaly Pulses: 2+ and symmetric Skin: Skin color, texture, turgor normal. No rashes or lesions Lymph nodes: cervical and submandibular nodes normal Neurologic: Grossly normal    Assessment:  influenza like syndrome    Plan:  Discussed diagnosis and treatment of influenza. Educational material distributed and questions answered. Suggested symptomatic OTC remedies. Supportive care with appropriate antipyretics and fluids. Tussinex Cough syrup per orders. Follow up as needed. Work note provided.

## 2018-01-29 ENCOUNTER — Telehealth: Payer: Self-pay | Admitting: Emergency Medicine

## 2018-02-01 ENCOUNTER — Ambulatory Visit (INDEPENDENT_AMBULATORY_CARE_PROVIDER_SITE_OTHER): Payer: Self-pay | Admitting: Nurse Practitioner

## 2018-02-01 ENCOUNTER — Encounter: Payer: Self-pay | Admitting: Nurse Practitioner

## 2018-02-01 VITALS — BP 132/84 | HR 95 | Temp 99.0°F | Resp 18 | Wt 216.4 lb

## 2018-02-01 DIAGNOSIS — J069 Acute upper respiratory infection, unspecified: Secondary | ICD-10-CM

## 2018-02-01 MED ORDER — PSEUDOEPH-BROMPHEN-DM 30-2-10 MG/5ML PO SYRP: 5.0000 mL | ORAL_SOLUTION | Freq: Three times a day (TID) | ORAL | 0 refills | Status: AC | PRN

## 2018-02-01 MED ORDER — AZITHROMYCIN 250 MG PO TABS: ORAL_TABLET | ORAL | 0 refills | Status: AC

## 2018-02-01 MED ORDER — PREDNISONE 10 MG (21) PO TBPK: ORAL_TABLET | ORAL | 0 refills | Status: AC

## 2018-02-01 MED FILL — BROMIPHENIR-PSEUDOEPHED-DM: 30-2-10 | 7 days supply | Qty: 120 | Fill #0

## 2018-02-01 MED FILL — AZITHROMYCIN 250 MG TAB: 250 | 5 days supply | Qty: 6 | Fill #0

## 2018-02-01 MED FILL — predniSONE 10 MG (21) TBPK: 10 | 6 days supply | Qty: 21 | Fill #0

## 2018-02-01 NOTE — Progress Notes (Signed)
Subjective:      Sherri Miller is a 42 y.o. Female that presents with a continued cough.  Patent was seen on 2/20 and tested negative for influenza.  Patient was given cough medicine and symptomatic treatment was recommended. Patient describes cough as productive of green sputum, nasal congestion, no  fever and post nasal drip. Onset of symptoms was 8 days ago, and has been gradually worsening since that time. Treatment to date: cough suppressants.   Patient states coughing is worse, and it keeps her up during the night.  Patient states Tussionex cough syrup has not helped.   The following portions of the patient's history were reviewed and updated as appropriate: allergies, current medications and past medical history.  Review of Systems Constitutional: positive for fatigue, negative for anorexia, fevers and malaise Eyes: negative Ears, nose, mouth, throat, and face: positive for nasal congestion and post-nasal drip, negative for ear drainage, earaches and hoarseness Respiratory: positive for cough and sputum, negative for asthma, chronic bronchitis, dyspnea on exertion, pneumonia, stridor and wheezing Cardiovascular: negative Gastrointestinal: negative Neurological: negative     Objective:    BP 132/84 (BP Location: Right Arm, Patient Position: Sitting, Cuff Size: Normal)   Pulse 95   Temp 99 F (37.2 C) (Oral)   Resp 18   Wt 216 lb 6.4 oz (98.2 kg)   SpO2 99%   BMI 31.96 kg/m  General appearance: alert, cooperative, fatigued, mild distress and due to cough Head: Normocephalic, without obvious abnormality, atraumatic Eyes: conjunctivae/corneas clear. PERRL, EOM's intact. Fundi benign. Ears: normal TM's and external ear canals both ears Nose: no discharge, mild congestion, turbinates swollen, inflamed, no sinus tenderness Throat: lips, mucosa, and tongue normal; teeth and gums normal Lungs: clear to auscultation bilaterally Heart: regular rate and rhythm, S1, S2 normal, no murmur, click,  rub or gallop Abdomen: soft, non-tender; bowel sounds normal; no masses,  no organomegaly Pulses: 2+ and symmetric Skin: Skin color, texture, turgor normal. No rashes or lesions Lymph nodes: cervical and submandibular nodes normal Neurologic: Grossly normal   Assessment:    Acute Upper Respiratory Infection   Plan:    Discussed diagnosis and treatment of URI. Suggested symptomatic OTC remedies. Nasal saline spray for congestion. Zithromax per orders. Follow up as needed. Patient education provided for URI and cough.  Patient instructed to continue use of humidifier.  Discussed with patient to elevate self on pillows at night time.  Use cough drops and increase fluids.  Patient to also use honey for cough.  Patient will follow up as needed.

## 2018-02-01 NOTE — Patient Instructions (Addendum)
Upper Respiratory Infection, Adult Most upper respiratory infections (URIs) are a viral infection of the air passages leading to the lungs. A URI affects the nose, throat, and upper air passages. The most common type of URI is nasopharyngitis and is typically referred to as "the common cold." URIs run their course and usually go away on their own. Most of the time, a URI does not require medical attention, but sometimes a bacterial infection in the upper airways can follow a viral infection. This is called a secondary infection. Sinus and middle ear infections are common types of secondary upper respiratory infections. Bacterial pneumonia can also complicate a URI. A URI can worsen asthma and chronic obstructive pulmonary disease (COPD). Sometimes, these complications can require emergency medical care and may be life threatening. What are the causes? Almost all URIs are caused by viruses. A virus is a type of germ and can spread from one person to another. What increases the risk? You may be at risk for a URI if:  You smoke.  You have chronic heart or lung disease.  You have a weakened defense (immune) system.  You are very young or very old.  You have nasal allergies or asthma.  You work in crowded or poorly ventilated areas.  You work in health care facilities or schools.  What are the signs or symptoms? Symptoms typically develop 2-3 days after you come in contact with a cold virus. Most viral URIs last 7-10 days. However, viral URIs from the influenza virus (flu virus) can last 14-18 days and are typically more severe. Symptoms may include:  Runny or stuffy (congested) nose.  Sneezing.  Cough.  Sore throat.  Headache.  Fatigue.  Fever.  Loss of appetite.  Pain in your forehead, behind your eyes, and over your cheekbones (sinus pain).  Muscle aches.  How is this diagnosed? Your health care provider may diagnose a URI by:  Physical exam.  Tests to check that your  symptoms are not due to another condition such as: ? Strep throat. ? Sinusitis. ? Pneumonia. ? Asthma.  How is this treated? A URI goes away on its own with time. It cannot be cured with medicines, but medicines may be prescribed or recommended to relieve symptoms. Medicines may help:  Reduce your fever.  Reduce your cough.  Relieve nasal congestion.  Follow these instructions at home:  Take medicines only as directed by your health care provider.  Gargle warm saltwater or take cough drops to comfort your throat as directed by your health care provider.  Use a warm mist humidifier or inhale steam from a shower to increase air moisture. This may make it easier to breathe.  Drink enough fluid to keep your urine clear or pale yellow.  Eat soups and other clear broths and maintain good nutrition.  Rest as needed.  Return to work when your temperature has returned to normal or as your health care provider advises. You may need to stay home longer to avoid infecting others. You can also use a face mask and careful hand washing to prevent spread of the virus.  Increase the usage of your inhaler if you have asthma.  Do not use any tobacco products, including cigarettes, chewing tobacco, or electronic cigarettes. If you need help quitting, ask your health care provider. How is this prevented? The best way to protect yourself from getting a cold is to practice good hygiene.  Avoid oral or hand contact with people with cold symptoms.  Wash your   hands often if contact occurs.  There is no clear evidence that vitamin C, vitamin E, echinacea, or exercise reduces the chance of developing a cold. However, it is always recommended to get plenty of rest, exercise, and practice good nutrition. Contact a health care provider if:  You are getting worse rather than better.  Your symptoms are not controlled by medicine.  You have chills.  You have worsening shortness of breath.  You have  brown or red mucus.  You have yellow or brown nasal discharge.  You have pain in your face, especially when you bend forward.  You have a fever.  You have swollen neck glands.  You have pain while swallowing.  You have white areas in the back of your throat. Get help right away if:  You have severe or persistent: ? Headache. ? Ear pain. ? Sinus pain. ? Chest pain.  You have chronic lung disease and any of the following: ? Wheezing. ? Prolonged cough. ? Coughing up blood. ? A change in your usual mucus.  You have a stiff neck.  You have changes in your: ? Vision. ? Hearing. ? Thinking. ? Mood. This information is not intended to replace advice given to you by your health care provider. Make sure you discuss any questions you have with your health care provider. Document Released: 05/20/2001 Document Revised: 07/27/2016 Document Reviewed: 03/01/2014 Elsevier Interactive Patient Education  2018 [employer]  Cough, Adult Coughing is a reflex that clears your throat and your airways. Coughing helps to heal and protect your lungs. It is normal to cough occasionally, but a cough that happens with other symptoms or lasts a long time may be a sign of a condition that needs treatment. A cough may last only 2-3 weeks (acute), or it may last longer than 8 weeks (chronic). What are the causes? Coughing is commonly caused by:  Breathing in substances that irritate your lungs.  A viral or bacterial respiratory infection.  Allergies.  Asthma.  Postnasal drip.  Smoking.  Acid backing up from the stomach into the esophagus (gastroesophageal reflux).  Certain medicines.  Chronic lung problems, including COPD (or rarely, lung cancer).  Other medical conditions such as heart failure.  Follow these instructions at home: Pay attention to any changes in your symptoms. Take these actions to help with your discomfort:  Take medicines only as told by your health care  provider. ? If you were prescribed an antibiotic medicine, take it as told by your health care provider. Do not stop taking the antibiotic even if you start to feel better. ? Talk with your health care provider before you take a cough suppressant medicine.  Drink enough fluid to keep your urine clear or pale yellow.  If the air is dry, use a cold steam vaporizer or humidifier in your bedroom or your home to help loosen secretions.  Avoid anything that causes you to cough at work or at home.  If your cough is worse at night, try sleeping in a semi-upright position.  Avoid cigarette smoke. If you smoke, quit smoking. If you need help quitting, ask your health care provider.  Avoid caffeine.  Avoid alcohol.  Rest as needed.  Contact a health care provider if:  You have new symptoms.  You cough up pus.  Your cough does not get better after 2-3 weeks, or your cough gets worse.  You cannot control your cough with suppressant medicines and you are losing sleep.  You develop pain that is   getting worse or pain that is not controlled with pain medicines.  You have a fever.  You have unexplained weight loss.  You have night sweats. Get help right away if:  You cough up blood.  You have difficulty breathing.  Your heartbeat is very fast. This information is not intended to replace advice given to you by your health care provider. Make sure you discuss any questions you have with your health care provider. Document Released: 05/23/2011 Document Revised: 05/01/2016 Document Reviewed: 01/31/2015 Elsevier Interactive Patient Education  2018 [employer] Inc.  

## 2018-03-03 MED FILL — RIZATRIPTAN BENZOATE 10 MG: 10 | 30 days supply | Qty: 12 | Fill #0

## 2018-03-03 MED FILL — LORYNA 3-0.02 MG TABS: 3-0.02 | 63 days supply | Qty: 84 | Fill #0

## 2018-03-03 MED FILL — RIZATRIPTAN 10 MG ODT: 10 | 30 days supply | Qty: 10 | Fill #0

## 2018-04-05 ENCOUNTER — Ambulatory Visit (INDEPENDENT_AMBULATORY_CARE_PROVIDER_SITE_OTHER): Payer: Self-pay | Admitting: Nurse Practitioner

## 2018-04-05 VITALS — BP 110/90 | HR 72 | Temp 98.8°F | Resp 18 | Wt 216.2 lb

## 2018-04-05 DIAGNOSIS — L03011 Cellulitis of right finger: Secondary | ICD-10-CM

## 2018-04-05 MED ORDER — CEPHALEXIN 500 MG PO CAPS: 500.0000 mg | ORAL_CAPSULE | Freq: Three times a day (TID) | ORAL | 0 refills | Status: AC

## 2018-04-05 MED FILL — CEPHALEXIN 500 MG CAPSULE: 500 | 10 days supply | Qty: 30 | Fill #0
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
## 2018-04-05 NOTE — Patient Instructions (Signed)
Paronychia Paronychia is an infection of the skin that surrounds a nail. It usually affects the skin around a fingernail, but it may also occur near a toenail. It often causes pain and swelling around the nail. This condition may come on suddenly or develop over a longer period. In some cases, a collection of pus (abscess) can form near or under the nail. Usually, paronychia is not serious and it clears up with treatment. What are the causes? This condition may be caused by bacteria or fungi. It is commonly caused by either Streptococcus or Staphylococcus bacteria. The bacteria or fungi often cause the infection by getting into the affected area through an opening in the skin, such as a cut or a hangnail. What increases the risk? This condition is more likely to develop in:  People who get their hands wet often, such as those who work as dishwashers, bartenders, or nurses.  People who bite their fingernails or suck their thumbs.  People who trim their nails too short.  People who have hangnails or injured fingertips.  People who get manicures.  People who have diabetes.  What are the signs or symptoms? Symptoms of this condition include:  Redness and swelling of the skin near the nail.  Tenderness around the nail when you touch the area.  Pus-filled bumps under the cuticle. The cuticle is the skin at the base or sides of the nail.  Fluid or pus under the nail.  Throbbing pain in the area.  How is this diagnosed? This condition is usually diagnosed with a physical exam. In some cases, a sample of pus may be taken from an abscess to be tested in a lab. This can help to determine what type of bacteria or fungi is causing the condition. How is this treated? Treatment for this condition depends on the cause and severity of the condition. If the condition is mild, it may clear up on its own in a few days. Your health care provider may recommend soaking the affected area in warm water a  few times a day. When treatment is needed, the options may include:  Antibiotic medicine, if the condition is caused by a bacterial infection.  Antifungal medicine, if the condition is caused by a fungal infection.  Incision and drainage, if an abscess is present. In this procedure, the health care provider will cut open the abscess so the pus can drain out.  Follow these instructions at home:  Soak the affected area in warm water if directed to do so by your health care provider. You may be told to do this for 20 minutes, 2-3 times a day. Keep the area dry in between soakings.  Take medicines only as directed by your health care provider.  If you were prescribed an antibiotic medicine, finish all of it even if you start to feel better.  Keep the affected area clean.  Do not try to drain a fluid-filled bump yourself.  If you will be washing dishes or performing other tasks that require your hands to get wet, wear rubber gloves. You should also wear gloves if your hands might come in contact with irritating substances, such as cleaners or chemicals.  Follow your health care provider's instructions about: ? Wound care. ? Bandage (dressing) changes and removal. Contact a health care provider if:  Your symptoms get worse or do not improve with treatment.  You have a fever or chills.  You have redness spreading from the affected area.  You have continued   or increased fluid, blood, or pus coming from the affected area.  Your finger or knuckle becomes swollen or is difficult to move. This information is not intended to replace advice given to you by your health care provider. Make sure you discuss any questions you have with your health care provider. Document Released: 05/20/2001 Document Revised: 05/01/2016 Document Reviewed: 11/01/2014 Elsevier Interactive Patient Education  2018 [employer] Inc.  

## 2018-04-08 ENCOUNTER — Telehealth: Payer: Self-pay

## 2018-04-08 NOTE — Telephone Encounter (Signed)
I was not able to communicate with the patient.

## 2018-06-22 MED FILL — LORYNA 3-0.02 MG TABS: 3-0.02 | 63 days supply | Qty: 84 | Fill #1

## 2018-06-22 MED FILL — GABAPENTIN 600 MG TABLET: 600 | 90 days supply | Qty: 270 | Fill #0

## 2018-06-22 MED FILL — RIZATRIPTAN BENZOATE 10 MG: 10 | 30 days supply | Qty: 12 | Fill #0

## 2018-07-22 NOTE — Telephone Encounter (Signed)
error 

## 2018-08-06 ENCOUNTER — Ambulatory Visit: Payer: 59 | Admitting: Nurse Practitioner

## 2018-08-06 ENCOUNTER — Encounter: Payer: Self-pay | Admitting: Nurse Practitioner

## 2018-08-06 VITALS — BP 122/82 | HR 76 | Temp 98.4°F | Ht 69.0 in | Wt 214.6 lb

## 2018-08-06 DIAGNOSIS — F4323 Adjustment disorder with mixed anxiety and depressed mood: Secondary | ICD-10-CM | POA: Diagnosis not present

## 2018-08-06 MED ORDER — CLONAZEPAM 0.25 MG PO TBDP: 0.2500 mg | ORAL_TABLET | Freq: Two times a day (BID) | ORAL | 0 refills | Status: DC | PRN

## 2018-08-06 MED ORDER — TRAZODONE HCL 50 MG PO TABS: 50.0000 mg | ORAL_TABLET | Freq: Every day | ORAL | 1 refills | Status: DC

## 2018-08-06 MED FILL — clonazePAM 0.25 MG TBDP: 0.25 | 3 days supply | Qty: 6 | Fill #0

## 2018-08-06 MED FILL — traZODone HCL 50 MG TABS: 50 | 30 days supply | Qty: 30 | Fill #0
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
## 2018-08-06 NOTE — Patient Instructions (Addendum)
Take trazodone half tab at bedtime x 3days then 1tab at bedtime.  use klonopin as needed during panic attacks  Do not take klonopin and trazodone within 4hrs of each other.  Major Depressive Disorder, Adult Major depressive disorder (MDD) is a mental health condition. MDD often makes you feel sad, hopeless, or helpless. MDD can also cause symptoms in your body. MDD can affect your:  Work.  School.  Relationships.  Other normal activities.  MDD can range from mild to very bad. It may occur once (single episode MDD). It can also occur many times (recurrent MDD). The main symptoms of MDD often include:  Feeling sad, depressed, or irritable most of the time.  Loss of interest.  MDD symptoms also include:  Sleeping too much or too little.  Eating too much or too little.  A change in your weight.  Feeling tired (fatigue) or having low energy.  Feeling worthless.  Feeling guilty.  Trouble making decisions.  Trouble thinking clearly.  Thoughts of suicide or harming others.  Feeling weak.  Feeling agitated.  Keeping yourself from being around other people (isolation).  Follow these instructions at home: Activity  Do these things as told by your doctor: ? Go back to your normal activities. ? Exercise regularly. ? Spend time outdoors. Alcohol  Talk with your doctor about how alcohol can affect your antidepressant medicines.  Do not drink alcohol. Or, limit how much alcohol you drink. ? This means no more than 1 drink a day for nonpregnant women and 2 drinks a day for men. One drink equals one of these:  12 oz of beer.  5 oz of wine.  1 oz of hard liquor. General instructions  Take over-the-counter and prescription medicines only as told by your doctor.  Eat a healthy diet.  Get plenty of sleep.  Find activities that you enjoy. Make time to do them.  Think about joining a support group. Your doctor may be able to suggest a group for you.  Keep all  follow-up visits as told by your doctor. This is important. Where to find more information:  The [employer] Mental Illness: ? www.nami.org  [employer] of Mental Health: ? http://www.maynard.net/www.nimh.nih.gov  National Suicide Prevention Lifeline: ? 623-271-17031-6092277450. This is free, 24-hour help. Contact a doctor if:  Your symptoms get worse.  You have new symptoms. Get help right away if:  You self-harm.  You see, hear, taste, smell, or feel things that are not present (hallucinate). If you ever feel like you may hurt yourself or others, or have thoughts about taking your own life, get help right away. You can go to your nearest emergency department or call:  Your local emergency services (911 in the [place]  A suicide crisis helpline, such as the National Suicide Prevention Lifeline: ? (873)360-42451-6092277450. This is open 24 hours a day.  This information is not intended to replace advice given to you by your health care provider. Make sure you discuss any questions you have with your health care provider. Document Released: 11/05/2015 Document Revised: 08/10/2016 Document Reviewed: 08/10/2016 Elsevier Interactive Patient Education  2017 [employer]

## 2018-08-18 ENCOUNTER — Encounter: Payer: 59 | Admitting: Nurse Practitioner

## 2018-08-20 MED FILL — RIZATRIPTAN BENZOATE 10 MG: 10 | 30 days supply | Qty: 12 | Fill #1

## 2018-09-09 MED FILL — traZODone HCL 50 MG TABS: 50 | 30 days supply | Qty: 30 | Fill #1

## 2018-09-14 MED FILL — RIZATRIPTAN BENZOATE 10 MG: 10 | 30 days supply | Qty: 12 | Fill #2

## 2018-10-26 ENCOUNTER — Other Ambulatory Visit: Payer: Self-pay | Admitting: Nurse Practitioner

## 2018-10-26 DIAGNOSIS — F4323 Adjustment disorder with mixed anxiety and depressed mood: Secondary | ICD-10-CM

## 2018-10-26 NOTE — Telephone Encounter (Signed)
Copied from CRM [id number]. Topic: Quick Communication - Rx Refill/Question >> Oct 26, 2018  1:27 PM [place], [place]: Medication: traZODone (DESYREL) 50 MG tablet / pt has OV scheduled for 10/29/18 but would like to know if she needs to be seen in order for Rx to be refilled. If not she would like to have appt canceled. Please advise Cb#  [phone]  Has the patient contacted their pharmacy? No. (Agent: If no, request that the patient contact the pharmacy for the refill.) (Agent: If yes, when and what did the pharmacy advise?)  Preferred Pharmacy (with phone number or street name): [facility] [place] [address] [phone] (Phone) [phone] (Fax)    Agent: Please be advised that RX refills may take up to 3 business days. We ask that you follow-up with your pharmacy.

## 2018-10-27 MED ORDER — TRAZODONE HCL 50 MG PO TABS: 50.0000 mg | ORAL_TABLET | Freq: Every day | ORAL | 1 refills | Status: DC

## 2018-10-28 ENCOUNTER — Encounter: Payer: Self-pay | Admitting: Nurse Practitioner

## 2018-10-28 ENCOUNTER — Ambulatory Visit: Payer: 59 | Admitting: Nurse Practitioner

## 2018-10-28 ENCOUNTER — Other Ambulatory Visit: Payer: Self-pay | Admitting: Nurse Practitioner

## 2018-10-28 DIAGNOSIS — F4323 Adjustment disorder with mixed anxiety and depressed mood: Secondary | ICD-10-CM

## 2018-10-28 MED ORDER — TRAZODONE HCL 50 MG PO TABS: 100.0000 mg | ORAL_TABLET | Freq: Every day | ORAL | 5 refills | Status: DC

## 2018-10-28 MED ORDER — CLONAZEPAM 0.25 MG PO TBDP: 0.2500 mg | ORAL_TABLET | Freq: Two times a day (BID) | ORAL | 0 refills | Status: DC | PRN

## 2018-10-28 MED FILL — RIZATRIPTAN BENZOATE 10 MG: 10 | 30 days supply | Qty: 12 | Fill #3

## 2018-10-28 NOTE — Patient Instructions (Signed)
Please have results from GYN faxed to me.  It was great to see you. Happy Holidays.

## 2018-10-28 NOTE — Progress Notes (Signed)
Subjective:  Patient ID: Sherri Miller, female    DOB: 10-03-1976  Age: 42 y.o. MRN: [id number]  CC: Follow-up (Anxiety and Depression)   Anxiety  Presents for follow-up visit. Symptoms include irritability, nervous/anxious behavior, panic and restlessness. Patient reports no decreased concentration, depressed mood, excessive worry, insomnia, malaise, muscle tension, palpitations, shortness of breath or suicidal ideas. Symptoms occur most days. The severity of symptoms is causing significant distress. The quality of sleep is fair. Nighttime awakenings: occasional.   Compliance with medications is 76-100%.  she reports significant improvement with trazodone, but feels like she hit a plato after the first month. She will like to increase trazodone dose. Denies any adverse effects with medication. Has use klonopin as needed for panic attacks. Reports imroved performance at work. Has planned a trip to [place] with close friend. Feel anxious about upcoming trip but knows she needs to start doing other things.  current counselling sessions 1x/week.  Has upcoming appt with GYN and labs will be done.  Reviewed past Medical, Social and Family history today.  Outpatient Medications Prior to Visit  Medication Sig Dispense Refill  . drospirenone-ethinyl estradiol (YAZ,GIANVI,LORYNA) 3-0.02 MG tablet Take 1 tablet by mouth daily.    Marland Kitchen gabapentin (NEURONTIN) 600 MG tablet Take 600 mg by mouth 3 (three) times daily.    Marland Kitchen HYDROcodone-acetaminophen (NORCO/VICODIN) 5-325 MG tablet Take by mouth.    . ondansetron (ZOFRAN) 8 MG tablet Take 8 mg by mouth every 8 (eight) hours as needed for nausea.     . rizatriptan (MAXALT-MLT) 10 MG disintegrating tablet Take 10 mg by mouth as needed for migraine. May repeat in 2 hours if needed    . clonazePAM (KLONOPIN) 0.25 MG disintegrating tablet Take 1 tablet (0.25 mg total) by mouth 2 (two) times daily as needed (anxiety). 6 tablet 0  . traZODone (DESYREL) 50  MG tablet Take 1 tablet (50 mg total) by mouth at bedtime. 30 tablet 1   No facility-administered medications prior to visit.     ROS See HPI  Objective:  BP 118/84   Pulse 70   Temp 97.7 F (36.5 C) (Oral)   Ht 5\' 9"  (1.753 m)   Wt 224 lb (101.6 kg)   SpO2 98%   BMI 33.08 kg/m   BP Readings from Last 3 Encounters:  10/28/18 118/84  08/06/18 122/82  04/05/18 110/90    Wt Readings from Last 3 Encounters:  10/28/18 224 lb (101.6 kg)  08/06/18 214 lb 9.6 oz (97.3 kg)  04/05/18 216 lb 3.2 oz (98.1 kg)    Physical Exam  Constitutional: She is oriented to person, place, and time.  Cardiovascular: Normal rate.  Pulmonary/Chest: Effort normal.  Neurological: She is alert and oriented to person, place, and time.  Psychiatric: She has a normal mood and affect. Her behavior is normal. Thought content normal.  Vitals reviewed.   Lab Results  Component Value Date   WBC 9.9 03/26/2015   HGB 12.5 03/26/2015   HCT 37.4 (A) 03/26/2015   PLT 283 07/21/2007   GLUCOSE 98 07/21/2007   NA 139 07/21/2007   K 3.2 (L) 07/21/2007   CL 104 07/21/2007   CREATININE 1.0 07/21/2007   BUN 12 07/21/2007    Mm Screening Breast Tomo Bilateral  Result Date: 01/08/2017 CLINICAL DATA:  Screening. Baseline. EXAM: 2D DIGITAL SCREENING BILATERAL MAMMOGRAM WITH CAD AND ADJUNCT TOMO COMPARISON:  None. ACR Breast Density Category b: There are scattered areas of fibroglandular density. FINDINGS: There are no  findings suspicious for malignancy. Images were processed with CAD. IMPRESSION: No mammographic evidence of malignancy. A result letter of this screening mammogram will be mailed directly to the patient. RECOMMENDATION: Screening mammogram in one year. (Code:SM-B-01Y) BI-RADS CATEGORY  1: Negative. Electronically Signed   By: Hulan Saashomas  Lawrence M.D.   On: 01/08/2017 15:35    Assessment & Plan:   Sherri Miller was seen today for follow-up.  Diagnoses and all orders for this visit:  Adjustment disorder  with mixed anxiety and depressed mood -     traZODone (DESYREL) 50 MG tablet; Take 2 tablets (100 mg total) by mouth at bedtime. -     clonazePAM (KLONOPIN) 0.25 MG disintegrating tablet; Take 1 tablet (0.25 mg total) by mouth 2 (two) times daily as needed (anxiety).   I have changed Sherri Miller's traZODone. I am also having her maintain her ondansetron, drospirenone-ethinyl estradiol, gabapentin, rizatriptan, HYDROcodone-acetaminophen, and clonazePAM.  Meds ordered this encounter  Medications  . traZODone (DESYREL) 50 MG tablet    Sig: Take 2 tablets (100 mg total) by mouth at bedtime.    Dispense:  60 tablet    Refill:  5    Order Specific Question:   Supervising Provider    Answer:   Dianne DunARON, TALIA M [[id number]]  . clonazePAM (KLONOPIN) 0.25 MG disintegrating tablet    Sig: Take 1 tablet (0.25 mg total) by mouth 2 (two) times daily as needed (anxiety).    Dispense:  6 tablet    Refill:  0    Order Specific Question:   Supervising Provider    Answer:   Dianne DunARON, TALIA M [[id number]]    Problem List Items Addressed This Visit    None    Visit Diagnoses    Adjustment disorder with mixed anxiety and depressed mood       Relevant Medications   traZODone (DESYREL) 50 MG tablet   clonazePAM (KLONOPIN) 0.25 MG disintegrating tablet       Follow-up: Return in about 3 months (around 01/28/2019) for anxiety and depression (30mins).  Alysia Pennaharlotte Sarea Fyfe, NP

## 2018-10-29 MED FILL — traZODone HCL 50 MG TABS: 50 | 30 days supply | Qty: 60 | Fill #0

## 2018-12-13 MED FILL — RIZATRIPTAN BENZOATE 10 MG: 10 | 30 days supply | Qty: 12 | Fill #4

## 2018-12-29 ENCOUNTER — Ambulatory Visit (INDEPENDENT_AMBULATORY_CARE_PROVIDER_SITE_OTHER): Payer: Self-pay | Admitting: Family Medicine

## 2018-12-29 ENCOUNTER — Encounter: Payer: Self-pay | Admitting: Family Medicine

## 2018-12-29 VITALS — BP 130/82 | HR 67 | Temp 98.2°F | Wt 224.0 lb

## 2018-12-29 DIAGNOSIS — J40 Bronchitis, not specified as acute or chronic: Secondary | ICD-10-CM

## 2018-12-29 DIAGNOSIS — R05 Cough: Secondary | ICD-10-CM

## 2018-12-29 DIAGNOSIS — J302 Other seasonal allergic rhinitis: Secondary | ICD-10-CM

## 2018-12-29 DIAGNOSIS — R059 Cough, unspecified: Secondary | ICD-10-CM

## 2018-12-29 MED ORDER — MONTELUKAST SODIUM 10 MG PO TABS: 10.0000 mg | ORAL_TABLET | Freq: Every day | ORAL | 0 refills | Status: DC

## 2018-12-29 MED ORDER — BENZONATATE 100 MG PO CAPS: 100.0000 mg | ORAL_CAPSULE | Freq: Two times a day (BID) | ORAL | 0 refills | Status: DC | PRN

## 2018-12-29 MED ORDER — PROMETHAZINE-DM 6.25-15 MG/5ML PO SYRP: 5.0000 mL | ORAL_SOLUTION | Freq: Three times a day (TID) | ORAL | 0 refills | Status: DC | PRN

## 2018-12-29 MED ORDER — PREDNISONE 20 MG PO TABS: 40.0000 mg | ORAL_TABLET | Freq: Every day | ORAL | 0 refills | Status: AC

## 2018-12-29 MED FILL — PROMETHAZINE W/DM SYRUP: 6.25-15 | 7 days supply | Qty: 118 | Fill #0

## 2018-12-29 MED FILL — LORYNA 3-0.02 MG TABS: 3-0.02 | 21 days supply | Qty: 28 | Fill #0

## 2018-12-29 MED FILL — predniSONE 20 MG TABS: 20 | 5 days supply | Qty: 10 | Fill #0

## 2018-12-29 MED FILL — BENZONATATE 100 MG CAP: 100 | 10 days supply | Qty: 20 | Fill #0

## 2018-12-29 MED FILL — MONTELUKAST SOD 10 MG TAB: 10 | 30 days supply | Qty: 30 | Fill #0

## 2018-12-29 NOTE — Progress Notes (Signed)
Subjective:   Sherri Miller is a 43 y.o. female presenting for chief complaint of Cough (x 3 week took tess pearls) Reports 3 week history of dry cough, .Has tried tessalon perles historically for relief.She confirms a history of seasonal allergies, denies a history of asthma but does report an annual bronchitis. Patient did receive a flu shot this season. Denies smoking. Denies any other aggravating or relieving factors, no other questions or concerns. Of note she reports that she experiences this exacerbation annually and has historically been treated with hydrocodone cough syrup and this creates relief of symptoms. She is currently pending a PCP visit in the next 30 days as well as a pulmonology consult due to the frequency of these symptoms but she denies any personal history of asthma. She does report that tessalon perles only give mild relief but are effective enough for her to work (she is a Engineer, civil (consulting)), she also states that any historical use of an inhaler is ineffective.  Review of Systems  Constitutional: Negative for chills, fever and malaise/fatigue.  HENT: Positive for sore throat. Negative for congestion, ear discharge, ear pain and sinus pain.   Eyes: Negative.   Respiratory: Positive for cough. Negative for sputum production, shortness of breath and wheezing.   Cardiovascular: Negative.  Negative for chest pain.  Gastrointestinal: Negative for abdominal pain, diarrhea, nausea and vomiting.  Genitourinary: Negative for dysuria, frequency, hematuria and urgency.  Musculoskeletal: Negative for myalgias.  Skin: Negative.   Neurological: Negative for dizziness and headaches.  Endo/Heme/Allergies: Negative.   Psychiatric/Behavioral: Negative.     Sherri Miller has a current medication list which includes the following prescription(s): clonazepam, drospirenone-ethinyl estradiol, gabapentin, ondansetron, rizatriptan, trazodone, benzonatate, hydrocodone-acetaminophen, montelukast, prednisone, and  promethazine-dextromethorphan. Also is allergic to codeine and penicillins.  Sherri Miller  has a past medical history of Allergy, Chicken pox, Depression, Migraine, and Migraines. Also  has no past surgical history on file.   Objective:   Vitals: BP 130/82 (BP Location: Right Arm, Patient Position: Sitting)   Pulse 67   Temp 98.2 F (36.8 C) (Oral)   Wt 224 lb (101.6 kg)   SpO2 98%   BMI 33.08 kg/m   Physical Exam Vitals signs reviewed.  Constitutional:      Appearance: She is well-developed. She is not toxic-appearing.  HENT:     Head: Normocephalic.     Right Ear: Hearing, tympanic membrane, ear canal and external ear normal.     Left Ear: Hearing, tympanic membrane, ear canal and external ear normal.     Nose: Nose normal.     Mouth/Throat:     Pharynx: Uvula midline. Posterior oropharyngeal erythema present. No pharyngeal swelling, oropharyngeal exudate or uvula swelling.     Tonsils: No tonsillar exudate or tonsillar abscesses.  Neck:     Musculoskeletal: Normal range of motion and neck supple.  Cardiovascular:     Rate and Rhythm: Normal rate and regular rhythm.     Pulses: Normal pulses.     Heart sounds: Normal heart sounds.  Pulmonary:     Effort: Pulmonary effort is normal.     Breath sounds: Normal breath sounds. No stridor, decreased air movement or transmitted upper airway sounds. No decreased breath sounds.     Comments: Lungs CTA anteriorly and posteriorly but a frequent forceful and barky cough on exam that is non productive and does not sound tight- no evidence of wheezing, sputum production, shortness of breath or vomiting on exam. Abdominal:     General: Bowel sounds are  normal.     Palpations: Abdomen is soft.  Musculoskeletal: Normal range of motion.  Lymphadenopathy:     Head:     Right side of head: Tonsillar adenopathy present. No submental or submandibular adenopathy.     Left side of head: Tonsillar adenopathy present. No submental or submandibular  adenopathy.     Cervical: No cervical adenopathy.  Neurological:     Mental Status: She is alert and oriented to person, place, and time.    No results found for this or any previous visit (from the past 24 hour(s)).  Assessment and Plan :  1. Cough Tessalon for daytime symptoms and patient may supplement with throat lozenges or any over the counter cough medicine of choice- promethazine DM given to trial in opposed to narcotic based cough medication alternative- patient asked to trial for effectiveness and call back if ineffective and no improvement in 48 hours- will consider possibly cheratussin at that time- documented allergy to codiene that patient reports is nausea and vomiting and that allergy only extends to "percocet"-(tylenol/oxycodone)? Patient is also on Trazadone nightly discussed risk of compound sedation. If not affective will consult pharmacist for guidance- patient encouraged to trial for at least 48 hours and use higher dose of tessalon 200 mg during the day for cough. Standard recommendations for sore throat of increased hydration, throat lozenges, warm liquids advised.  - benzonatate (TESSALON) 100 MG capsule; Take 1 capsule (100 mg total) by mouth 2 (two) times daily as needed for cough.  Dispense: 20 capsule; Refill: 0 - promethazine-dextromethorphan (PROMETHAZINE-DM) 6.25-15 MG/5ML syrup; Take 5 mLs by mouth 3 (three) times daily as needed for cough.  Dispense: 118 mL; Refill: 0  2. Bronchitis Prolonged cough symptoms x 3 weeks- prednisone may help with bronchial irritation related to frequent forceful coughing. - predniSONE (DELTASONE) 20 MG tablet; Take 2 tablets (40 mg total) by mouth daily with breakfast for 5 days.  Dispense: 10 tablet; Refill: 0  3. Seasonal allergies May help with cough patient does have a history of seasonal allergies that appear to be untreated- intermittently treated. - montelukast (SINGULAIR) 10 MG tablet; Take 1 tablet (10 mg total) by mouth at  bedtime.  Dispense: 30 tablet; Refill: 0  Discussed exam findings, diagnosis etiology and medication use and indications reviewed with patient. Follow- Up and discharge instructions provided. No emergent/urgent issues found on exam.  Patient verbalized understanding of information provided and agrees with plan of care (POC), all questions answered.   12/29/2018 3:26 PM

## 2018-12-29 NOTE — Patient Instructions (Signed)
Cough, Adult  Coughing is a reflex that clears your throat and your airways. Coughing helps to heal and protect your lungs. It is normal to cough occasionally, but a cough that happens with other symptoms or lasts a long time may be a sign of a condition that needs treatment. A cough may last only 2-3 weeks (acute), or it may last longer than 8 weeks (chronic). What are the causes? Coughing is commonly caused by:  Breathing in substances that irritate your lungs.  A viral or bacterial respiratory infection.  Allergies.  Asthma.  Postnasal drip.  Smoking.  Acid backing up from the stomach into the esophagus (gastroesophageal reflux).  Certain medicines.  Chronic lung problems, including COPD (or rarely, lung cancer).  Other medical conditions such as heart failure. Follow these instructions at home: Pay attention to any changes in your symptoms. Take these actions to help with your discomfort:  Take medicines only as told by your health care provider. ? If you were prescribed an antibiotic medicine, take it as told by your health care provider. Do not stop taking the antibiotic even if you start to feel better. ? Talk with your health care provider before you take a cough suppressant medicine.  Drink enough fluid to keep your urine clear or pale yellow.  If the air is dry, use a cold steam vaporizer or humidifier in your bedroom or your home to help loosen secretions.  Avoid anything that causes you to cough at work or at home.  If your cough is worse at night, try sleeping in a semi-upright position.  Avoid cigarette smoke. If you smoke, quit smoking. If you need help quitting, ask your health care provider.  Avoid caffeine.  Avoid alcohol.  Rest as needed. Contact a health care provider if:  You have new symptoms.  You cough up pus.  Your cough does not get better after 2-3 weeks, or your cough gets worse.  You cannot control your cough with suppressant  medicines and you are losing sleep.  You develop pain that is getting worse or pain that is not controlled with pain medicines.  You have a fever.  You have unexplained weight loss.  You have night sweats. Get help right away if:  You cough up blood.  You have difficulty breathing.  Your heartbeat is very fast. This information is not intended to replace advice given to you by your health care provider. Make sure you discuss any questions you have with your health care provider. Document Released: 05/23/2011 Document Revised: 05/01/2016 Document Reviewed: 01/31/2015 Elsevier Interactive Patient Education  2019 Elsevier Inc.  

## 2018-12-31 ENCOUNTER — Telehealth: Payer: Self-pay

## 2018-12-31 NOTE — Telephone Encounter (Signed)
I was no able to contact the patient in the number provided  

## 2019-01-14 MED FILL — traZODone HCL 50 MG TABS: 50 | 30 days supply | Qty: 60 | Fill #1

## 2019-01-27 ENCOUNTER — Ambulatory Visit (INDEPENDENT_AMBULATORY_CARE_PROVIDER_SITE_OTHER): Payer: Self-pay | Admitting: Family Medicine

## 2019-01-27 ENCOUNTER — Encounter: Payer: Self-pay | Admitting: Family Medicine

## 2019-01-27 DIAGNOSIS — R05 Cough: Secondary | ICD-10-CM

## 2019-01-27 DIAGNOSIS — J40 Bronchitis, not specified as acute or chronic: Secondary | ICD-10-CM

## 2019-01-27 DIAGNOSIS — R059 Cough, unspecified: Secondary | ICD-10-CM

## 2019-01-27 DIAGNOSIS — R6889 Other general symptoms and signs: Secondary | ICD-10-CM

## 2019-01-27 LAB — POCT INFLUENZA A/B
INFLUENZA B, POC: NEGATIVE
Influenza A, POC: NEGATIVE

## 2019-01-27 MED ORDER — BENZONATATE 100 MG PO CAPS: 100.0000 mg | ORAL_CAPSULE | Freq: Three times a day (TID) | ORAL | 0 refills | Status: DC | PRN

## 2019-01-27 MED ORDER — DOXYCYCLINE HYCLATE 100 MG PO TABS: 100.0000 mg | ORAL_TABLET | Freq: Two times a day (BID) | ORAL | 0 refills | Status: DC

## 2019-01-27 MED ORDER — GUAIFENESIN-CODEINE 100-10 MG/5ML PO SYRP: 5.0000 mL | ORAL_SOLUTION | Freq: Every evening | ORAL | 0 refills | Status: DC

## 2019-01-27 MED ORDER — ALBUTEROL SULFATE HFA 108 (90 BASE) MCG/ACT IN AERS: 2.0000 | INHALATION_SPRAY | Freq: Four times a day (QID) | RESPIRATORY_TRACT | 0 refills | Status: DC | PRN

## 2019-01-27 MED FILL — BENZONATATE 100 MG CAP: 100 | 5 days supply | Qty: 30 | Fill #0

## 2019-01-27 MED FILL — VENTOLIN HFA 90 MCG INHALER: 108 (90 BAS | 25 days supply | Qty: 18 | Fill #0

## 2019-01-27 MED FILL — DOXYCYCLINE HYCLATE 100 MG: 100 | 10 days supply | Qty: 20 | Fill #0

## 2019-01-27 MED FILL — GUAIATUSSIN AC LIQUID: 100-10 | 13 days supply | Qty: 65 | Fill #0

## 2019-01-27 NOTE — Patient Instructions (Addendum)
PLAN< Will treat symptoms with abx and supportive care, if symptoms not improved must be evaluated at  Alternate location like PCP or Urgent Care with greater diagnostic capabilities.  Acute Bronchitis, Adult Acute bronchitis is when air tubes (bronchi) in the lungs suddenly get swollen. The condition can make it hard to breathe. It can also cause these symptoms:  A cough.  Coughing up clear, yellow, or green mucus.  Wheezing.  Chest congestion.  Shortness of breath.  A fever.  Body aches.  Chills.  A sore throat. Follow these instructions at home:  Medicines  Take over-the-counter and prescription medicines only as told by your doctor.  If you were prescribed an antibiotic medicine, take it as told by your doctor. Do not stop taking the antibiotic even if you start to feel better. General instructions  Rest.  Drink enough fluids to keep your pee (urine) pale yellow.  Avoid smoking and secondhand smoke. If you smoke and you need help quitting, ask your doctor. Quitting will help your lungs heal faster.  Use an inhaler, cool mist vaporizer, or humidifier as told by your doctor.  Keep all follow-up visits as told by your doctor. This is important. How is this prevented? To lower your risk of getting this condition again:  Wash your hands often with soap and water. If you cannot use soap and water, use hand sanitizer.  Avoid contact with people who have cold symptoms.  Try not to touch your hands to your mouth, nose, or eyes.  Make sure to get the flu shot every year. Contact a doctor if:  Your symptoms do not get better in 2 weeks. Get help right away if:  You cough up blood.  You have chest pain.  You have very bad shortness of breath.  You become dehydrated.  You faint (pass out) or keep feeling like you are going to pass out.  You keep throwing up (vomiting).  You have a very bad headache.  Your fever or chills gets worse. This information is  not intended to replace advice given to you by your health care provider. Make sure you discuss any questions you have with your health care provider. Document Released: 05/12/2008 Document Revised: 07/08/2017 Document Reviewed: 05/14/2016 Elsevier Interactive Patient Education  2019 [employer].

## 2019-01-27 NOTE — Progress Notes (Signed)
Sherri Miller is a 43 y.o. female who presents today with  days of 5 days of cold and cough symptoms. She was seen and treated in the last month for bronchitis and started on prednisone, astelin, singulair and promethazine DM. She reports that the symptoms did improve outside of the cough and then recently restarted in the last week with fatigue, chills, body aches, and reported productive cough. She began to use elderberry with report relief for a few days and then a worsening/persistance of symptoms. 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  Review of Systems  Constitutional: Positive for chills and malaise/fatigue. Negative for fever.  HENT: Positive for congestion. Negative for ear discharge, ear pain, sinus pain and sore throat.   Eyes: Negative.   Respiratory: Positive for cough and sputum production. Negative for shortness of breath.   Cardiovascular: Negative.  Negative for chest pain.  Gastrointestinal: Negative for abdominal pain, diarrhea, heartburn, nausea and vomiting.  Genitourinary: Negative for dysuria, frequency, hematuria and urgency.  Musculoskeletal: Positive for myalgias.  Skin: Negative.   Neurological: Negative for dizziness and headaches.  Endo/Heme/Allergies: Negative.   Psychiatric/Behavioral: Negative.     Sherri Miller has a current medication list which includes the following prescription(s): drospirenone-ethinyl estradiol, gabapentin, albuterol, benzonatate, clonazepam, doxycycline, guaifenesin-codeine, hydrocodone-acetaminophen, montelukast, ondansetron, promethazine-dextromethorphan, rizatriptan, and trazodone. Also is allergic to codeine and penicillins.  Sherri Miller  has a past medical history of Allergy, Chicken pox, Depression, Migraine, and Migraines. Also  has no past surgical history on file.    O: Vitals:   01/27/19 0839  BP: 120/82  Pulse: 75  Resp: 14  Temp: 98.3 F (36.8 C)  SpO2: 98%     Physical Exam Vitals signs  reviewed.  Constitutional:      General: She is not in acute distress.    Appearance: Normal appearance. She is well-developed and normal weight. She is not ill-appearing, toxic-appearing or diaphoretic.  HENT:     Head: Normocephalic.     Right Ear: Hearing, tympanic membrane, ear canal and external ear normal. No middle ear effusion. Tympanic membrane is not injected or erythematous.     Left Ear: Hearing, tympanic membrane, ear canal and external ear normal.  No middle ear effusion. Tympanic membrane is not injected or erythematous.     Nose:     Right Sinus: Frontal sinus tenderness present. No maxillary sinus tenderness.     Left Sinus: Frontal sinus tenderness present. No maxillary sinus tenderness.     Mouth/Throat:     Lips: Pink.     Mouth: Mucous membranes are moist.     Pharynx: Uvula midline. No pharyngeal swelling, oropharyngeal exudate, posterior oropharyngeal erythema or uvula swelling.     Tonsils: No tonsillar exudate or tonsillar abscesses. Swelling: 1+ on the right. 1+ on the left.  Eyes:     General: Lids are normal.  Neck:     Musculoskeletal: Normal range of motion and neck supple.  Cardiovascular:     Rate and Rhythm: Normal rate and regular rhythm.     Pulses: Normal pulses.     Heart sounds: Normal heart sounds.  Pulmonary:     Effort: Pulmonary effort is normal.     Breath sounds: Normal breath sounds. No transmitted upper airway sounds. No decreased breath sounds, wheezing, rhonchi or rales.     Comments: Forceful dry barky cough on exam Abdominal:     General: Bowel sounds are normal.  Palpations: Abdomen is soft.  Musculoskeletal: Normal range of motion.  Lymphadenopathy:     Head:     Right side of head: No submental, submandibular or tonsillar adenopathy.     Left side of head: No submental, submandibular or tonsillar adenopathy.     Cervical: Cervical adenopathy present.     Right cervical: Superficial cervical adenopathy present.     Left  cervical: Superficial cervical adenopathy present.  Skin:    General: Skin is warm.  Neurological:     Mental Status: She is alert and oriented to person, place, and time.  Psychiatric:        Behavior: Behavior is cooperative.    A: 1. Bronchitis   2. Flu-like symptoms   3. Cough    P: PLAN< Will treat symptoms with abx and supportive care, if symptoms not improved must be evaluated at  Alternate location like PCP or Urgent Care with greater diagnostic capabilities.  1. Bronchitis Will treat with abx today due to persistence of cough. Discussed potential for post-viral cough to persist for up to 7 weeks. Lungs CTA with no evidence of adventitious lung sounds on auscultation. Will follow up with patient in 48 hours to assess cough response to medications. Patient has primary care appointment schedule next week 2/26 where she will address this persistent cough.  - doxycycline (VIBRA-TABS) 100 MG tablet; Take 1 tablet (100 mg total) by mouth 2 (two) times daily.  2. 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 - POCT Influenza A/B Results for orders placed or performed in visit on 01/27/19 (from the past 24 hour(s))  POCT Influenza A/B     Status: Normal   Collection Time: 01/27/19  8:59 AM  Result Value Ref Range   Influenza A, POC Negative Negative   Influenza B, POC Negative Negative   3. Cough - benzonatate (TESSALON) 100 MG capsule; Take 1-2 capsules (100-200 mg total) by mouth 3 (three) times daily as needed for cough (with full glass of water). - albuterol (PROVENTIL HFA;VENTOLIN HFA) 108 (90 Base) MCG/ACT inhaler; Inhale 2 puffs into the lungs every 6 (six) hours as needed for wheezing or shortness of breath. - guaiFENesin-codeine (CHERATUSSIN AC) 100-10 MG/5ML syrup; Take 5 mLs by mouth every evening.   Discussed with patient exam findings, suspected diagnosis etiology and  reviewed recommended treatment plan and  follow up, including complications and indications for urgent medical follow up and evaluation. Medications including use and indications reviewed with patient. Patient provided relevant patient education on diagnosis and/or relevant related condition that were discussed and reviewed with patient at discharge. Patient verbalized understanding of information provided and agrees with plan of care (POC), all questions answered.

## 2019-01-28 ENCOUNTER — Ambulatory Visit: Payer: 59 | Admitting: Nurse Practitioner

## 2019-01-31 ENCOUNTER — Telehealth: Payer: Self-pay

## 2019-01-31 NOTE — Telephone Encounter (Signed)
Patient was not available.

## 2019-02-01 DIAGNOSIS — R05 Cough: Secondary | ICD-10-CM | POA: Diagnosis not present

## 2019-02-01 DIAGNOSIS — R300 Dysuria: Secondary | ICD-10-CM | POA: Diagnosis not present

## 2019-02-01 DIAGNOSIS — N760 Acute vaginitis: Secondary | ICD-10-CM | POA: Diagnosis not present

## 2019-02-01 DIAGNOSIS — B9689 Other specified bacterial agents as the cause of diseases classified elsewhere: Secondary | ICD-10-CM | POA: Diagnosis not present

## 2019-02-01 DIAGNOSIS — Z01419 Encounter for gynecological examination (general) (routine) without abnormal findings: Secondary | ICD-10-CM | POA: Diagnosis not present

## 2019-02-01 DIAGNOSIS — Z3041 Encounter for surveillance of contraceptive pills: Secondary | ICD-10-CM | POA: Diagnosis not present

## 2019-02-01 MED FILL — LORYNA 3-0.02 MG TABS: 3-0.02 | 72 days supply | Qty: 84 | Fill #0

## 2019-02-03 ENCOUNTER — Telehealth: Payer: 59 | Admitting: Physician Assistant

## 2019-02-03 DIAGNOSIS — B373 Candidiasis of vulva and vagina: Secondary | ICD-10-CM

## 2019-02-03 DIAGNOSIS — B3731 Acute candidiasis of vulva and vagina: Secondary | ICD-10-CM

## 2019-02-03 NOTE — Progress Notes (Signed)
We are sorry that you are not feeling well. Here is how we plan to help! Based on what you shared with me it looks like you: May have a yeast vaginosis  Vaginosis is an inflammation of the vagina that can result in discharge, itching and pain. The cause is usually a change in the normal balance of vaginal bacteria or an infection. Vaginosis can also result from reduced estrogen levels after menopause.  The most common causes of vaginosis are:   Bacterial vaginosis which results from an overgrowth of one on several organisms that are normally present in your vagina.   Yeast infections which are caused by a naturally occurring fungus called candida.   Vaginal atrophy (atrophic vaginosis) which results from the thinning of the vagina from reduced estrogen levels after menopause.   Trichomoniasis which is caused by a parasite and is commonly transmitted by sexual intercourse.  Factors that increase your risk of developing vaginosis include: Marland Kitchen Medications, such as antibiotics and steroids . Uncontrolled diabetes . Use of hygiene products such as bubble bath, vaginal spray or vaginal deodorant . Douching . Wearing damp or tight-fitting clothing . Using an intrauterine device (IUD) for birth control . Hormonal changes, such as those associated with pregnancy, birth control pills or menopause . Sexual activity . Having a sexually transmitted infection  Your treatment plan is Monistat (miconazole) or Gyne-Lotrimin (clotrimazole) over the counter at most phamacies. I see Trazodone on your list which can interact with certain yeast medications (like Diflucan). The Miconazole will be the safest option to start with.   Be sure to take all of the medication as directed. Stop taking any medication if you develop a rash, tongue swelling or shortness of breath. Mothers who are breast feeding should consider pumping and discarding their breast milk while on these antibiotics. However, there is no consensus  that infant exposure at these doses would be harmful.  Remember that medication creams can weaken latex condoms. Marland Kitchen   HOME CARE:  Good hygiene may prevent some types of vaginosis from recurring and may relieve some symptoms:  . Avoid baths, hot tubs and whirlpool spas. Rinse soap from your outer genital area after a shower, and dry the area well to prevent irritation. Don't use scented or harsh soaps, such as those with deodorant or antibacterial action. Marland Kitchen Avoid irritants. These include scented tampons and pads. . Wipe from front to back after using the toilet. Doing so avoids spreading fecal bacteria to your vagina.  Other things that may help prevent vaginosis include:  Marland Kitchen Don't douche. Your vagina doesn't require cleansing other than normal bathing. Repetitive douching disrupts the normal organisms that reside in the vagina and can actually increase your risk of vaginal infection. Douching won't clear up a vaginal infection. . Use a latex condom. Both female and female latex condoms may help you avoid infections spread by sexual contact. . Wear cotton underwear. Also wear pantyhose with a cotton crotch. If you feel comfortable without it, skip wearing underwear to bed. Yeast thrives in [employer] Your symptoms should improve in the next day or two.  GET HELP RIGHT AWAY IF:  . You have pain in your lower abdomen ( pelvic area or over your ovaries) . You develop nausea or vomiting . You develop a fever . Your discharge changes or worsens . You have persistent pain with intercourse . You develop shortness of breath, a rapid pulse, or you faint.  These symptoms could be signs of problems or infections  that need to be evaluated by a medical provider now.  MAKE SURE YOU    Understand these instructions.  Will watch your condition.  Will get help right away if you are not doing well or get worse.  Your e-visit answers were reviewed by a board certified advanced clinical  practitioner to complete your personal care plan. Depending upon the condition, your plan could have included both over the counter or prescription medications. Please review your pharmacy choice to make sure that you have choses a pharmacy that is open for you to pick up any needed prescription, Your safety is important to [place]. If you have drug allergies check your prescription carefully.   You can use MyChart to ask questions about today's visit, request a non-urgent call back, or ask for a work or school excuse for 24 hours related to this e-Visit. If it has been greater than 24 hours you will need to follow up with your provider, or enter a new e-Visit to address those concerns. You will get a MyChart message within the next two days asking about your experience. I hope that your e-visit has been valuable and will speed your recovery.

## 2019-02-03 NOTE — Progress Notes (Signed)
I have spent 5 minutes in review of chart, questionnaire review, medical decision making and response to patient.   Piedad Climes, PA-C

## 2019-02-04 ENCOUNTER — Ambulatory Visit: Payer: Self-pay

## 2019-02-04 ENCOUNTER — Other Ambulatory Visit: Payer: Self-pay | Admitting: Occupational Medicine

## 2019-02-04 DIAGNOSIS — M25512 Pain in left shoulder: Secondary | ICD-10-CM

## 2019-02-04 MED ORDER — METHOCARBAMOL 500 MG PO TABS: 500.0000 mg | ORAL_TABLET | Freq: Four times a day (QID) | ORAL | 0 refills | Status: DC

## 2019-02-04 MED ORDER — PREDNISONE 10 MG PO TABS: 10.0000 mg | ORAL_TABLET | Freq: Every day | ORAL | 0 refills | Status: DC

## 2019-02-04 MED ORDER — HYDROCODONE-ACETAMINOPHEN 5-325 MG PO TABS: 1.0000 | ORAL_TABLET | Freq: Every evening | ORAL | 0 refills | Status: DC | PRN

## 2019-02-04 MED FILL — METHOCARBAMOL 500 MG TABLET: 500 | 7 days supply | Qty: 30 | Fill #0

## 2019-02-04 MED FILL — HYDROCODON-APAP 5-325: 5-325 | 5 days supply | Qty: 10 | Fill #0

## 2019-02-04 MED FILL — predniSONE 10 MG TABS: 10 | 8 days supply | Qty: 20 | Fill #0

## 2019-02-16 ENCOUNTER — Telehealth: Payer: 59 | Admitting: Nurse Practitioner

## 2019-02-16 DIAGNOSIS — R6889 Other general symptoms and signs: Secondary | ICD-10-CM

## 2019-02-16 MED ORDER — OSELTAMIVIR PHOSPHATE 75 MG PO CAPS: 75.0000 mg | ORAL_CAPSULE | Freq: Two times a day (BID) | ORAL | 0 refills | Status: DC

## 2019-02-16 NOTE — Progress Notes (Signed)
E visit for Flu like symptoms   We are sorry that you are not feeling well.  Here is how we plan to help! Based on what you have shared with me it looks like you may have a respiratory virus that may be influenza.  Influenza or "the flu" is   an infection caused by a respiratory virus. The flu virus is highly contagious and persons who did not receive their yearly flu vaccination may "catch" the flu from close contact.  We have anti-viral medications to treat the viruses that cause this infection. They are not a "cure" and only shorten the course of the infection. These prescriptions are most effective when they are given within the first 2 days of "flu" symptoms. Antiviral medication are indicated if you have a high risk of complications from the flu. You should  also consider an antiviral medication if you are in close contact with someone who is at risk. These medications can help patients avoid complications from the flu  but have side effects that you should know. Possible side effects from Tamiflu or oseltamivir include nausea, vomiting, diarrhea, dizziness, headaches, eye redness, sleep problems or other respiratory symptoms. You should not take Tamiflu if you have an allergy to oseltamivir or any to the ingredients in Tamiflu.  Based upon your symptoms and potential risk factors I have prescribed Oseltamivir (Tamiflu).  It has been sent to your designated pharmacy.  You will take one 75 mg capsule orally twice a day for the next 5 days.  ANYONE WHO HAS FLU SYMPTOMS SHOULD: . Stay home. The flu is highly contagious and going out or to work exposes others! . Be sure to drink plenty of fluids. Water is fine as well as fruit juices, sodas and electrolyte beverages. You may want to stay away from caffeine or alcohol. If you are nauseated, try taking small sips of liquids. How do you know if you are getting enough fluid? Your urine should be a pale yellow or almost colorless. . Get rest. . Taking a  steamy shower or using a humidifier may help nasal congestion and ease sore throat pain. Using a saline nasal spray works much the same way. . Cough drops, hard candies and sore throat lozenges may ease your cough. . Line up a caregiver. Have someone check on you regularly.   GET HELP RIGHT AWAY IF: . You cannot keep down liquids or your medications. . You become short of breath . Your fell like you are going to pass out or loose consciousness. . Your symptoms persist after you have completed your treatment plan MAKE SURE YOU   Understand these instructions.  Will watch your condition.  Will get help right away if you are not doing well or get worse.  Your e-visit answers were reviewed by a board certified advanced clinical practitioner to complete your personal care plan.  Depending on the condition, your plan could have included both over the counter or prescription medications.  If there is a problem please reply  once you have received a response from your provider.  Your safety is important to us.  If you have drug allergies check your prescription carefully.    You can use MyChart to ask questions about today's visit, request a non-urgent call back, or ask for a work or school excuse for 24 hours related to this e-Visit. If it has been greater than 24 hours you will need to follow up with your provider, or enter a new e-Visit   to address those concerns.  You will get an e-mail in the next two days asking about your experience.  I hope that your e-visit has been valuable and will speed your recovery. Thank you for using e-visits.  9 minutes spent reviewing and documenting in chart. ( patient put pharmacy in computer in correctly and I had trouble reaching them.

## 2019-02-28 ENCOUNTER — Telehealth: Payer: Self-pay | Admitting: Nurse Practitioner

## 2019-02-28 ENCOUNTER — Other Ambulatory Visit: Payer: Self-pay | Admitting: Nurse Practitioner

## 2019-02-28 DIAGNOSIS — F4323 Adjustment disorder with mixed anxiety and depressed mood: Secondary | ICD-10-CM

## 2019-02-28 MED ORDER — TRAZODONE HCL 50 MG PO TABS: 100.0000 mg | ORAL_TABLET | Freq: Every day | ORAL | 0 refills | Status: DC

## 2019-02-28 NOTE — Telephone Encounter (Signed)
Please advise to make virtual visit appt for anxiety and depression within the next 2weeks.

## 2019-02-28 NOTE — Telephone Encounter (Signed)
Sherri Miller please advise, [facility] is temporarily closed. Pt is trying to get her refills switched to [facility] [address]. But cannot do this until they open back up. Pt is requesting a temporary supply of trazadone 50 mg tab until this can happen pt stated she has one for tonight and works next 4 days in a row and needs refill please advise.

## 2019-02-28 NOTE — Telephone Encounter (Signed)
Copied from CRM [id number]. Topic: Quick Communication - Rx Refill/Question >> Feb 28, 2019  2:21 PM Angela Nevin wrote: Medication: traZODone (DESYREL) 50 MG tablet  Has the patient contacted their pharmacy? Yes, patient was advised to contact office as [facility].   Preferred Pharmacy (with phone number or street name):[id number] [place], [place] - [address]  [phone] (Phone) [phone] (Fax)

## 2019-03-01 NOTE — Telephone Encounter (Signed)
[facility], left vm for the pt to call back, we need to make 2 wk follow up for the pt as well ( it will be webex--video call with PCP).

## 2019-03-01 NOTE — Telephone Encounter (Signed)
I called and left vm for the pt to call back and schedule an appt with PCP. Please help follow up with the pt tomorrow or when you get a chance.

## 2019-03-02 NOTE — Telephone Encounter (Signed)
I called and left message on patient voicemail to call office and schedule appointment to see Claris Gower in 2 weeks. I left message for patient to call office and ask to speak to [facility] to schedule Webex appointment per [facility].

## 2019-03-10 MED FILL — RIZATRIPTAN BENZOATE 10 MG: 10 | 30 days supply | Qty: 12 | Fill #5

## 2019-03-10 MED FILL — traZODone HCL 50 MG TABS: 50 | 30 days supply | Qty: 60 | Fill #2

## 2019-03-10 MED FILL — DROSPIR-ETH ESTRA 3/.02 MG: 3-0.02 | 72 days supply | Qty: 84 | Fill #1

## 2019-03-28 ENCOUNTER — Telehealth: Payer: Self-pay | Admitting: Nurse Practitioner

## 2019-03-28 NOTE — Telephone Encounter (Signed)
I called and left message on patient voicemail per Alysia Penna to call office and schedule a virtual appointment for follow up.

## 2019-04-25 ENCOUNTER — Other Ambulatory Visit: Payer: Self-pay | Admitting: Nurse Practitioner

## 2019-04-25 DIAGNOSIS — F4323 Adjustment disorder with mixed anxiety and depressed mood: Secondary | ICD-10-CM

## 2019-04-25 NOTE — Telephone Encounter (Signed)
LVM for the pt to call back, need to schedule an appt with [facility].  

## 2019-05-17 ENCOUNTER — Telehealth: Payer: 59 | Admitting: Physician Assistant

## 2019-05-17 DIAGNOSIS — H5710 Ocular pain, unspecified eye: Secondary | ICD-10-CM

## 2019-05-17 NOTE — Progress Notes (Signed)
Based on what you shared with me, I feel your condition warrants further evaluation and I recommend that you be seen for a face to face office visit.     NOTE: If you entered your credit card information for this eVisit, you will not be charged. You may see a "hold" on your card for the $35 but that hold will drop off and you will not have a charge processed.  If you are having a true medical emergency please call 911.  If you need an urgent face to face visit, [facility] has four urgent care centers for your convenience.      [email or web address] to reserve your spot online an avoid wait times  [facility] [address], [address] [place], [place] [address] Modified hours of operation: Monday-Friday, 12 PM to 6 PM  Saturday & Sunday 10 AM to 4 PM *Across the street from [employer]  [facility] (New Address!) [address], [address] [place], [place] [address] *Just off [address], across the road from [employer]* Modified hours of operation: Monday-Friday, 12 PM to 6 PM  Closed Saturday & Sunday   The following sites will take your insurance:  . [facility]  [phone] Get Driving Directions Find a Provider at this Location  [address] [place], [place] [address] . 10 am to 8 pm Monday-Friday . 12 pm to 8 pm Saturday-Sunday   . [facility]  [phone] Get Driving Directions Find a Provider at this Location  [address], [address] [place], [place] [address] . 8 am to 8 pm Monday-Friday . 9 am to 6 pm Saturday . 11 am to 6 pm Sunday   . [facility] [place]  [phone] Get Driving Directions  [address] [place], [place] [address] . 8 am to 8 pm Monday-Friday . 8 am to 4 pm Saturday-Sunday   Your e-visit answers were reviewed by a board certified advanced clinical practitioner to complete your personal care  plan.  Thank you for using e-Visits.  

## 2019-05-18 DIAGNOSIS — H00025 Hordeolum internum left lower eyelid: Secondary | ICD-10-CM | POA: Diagnosis not present

## 2019-05-18 MED FILL — RIZATRIPTAN BENZOATE 10 MG: 10 | 30 days supply | Qty: 12 | Fill #6

## 2019-05-18 MED FILL — CEPHALEXIN 500 MG CAPSULE: 500 | 10 days supply | Qty: 30 | Fill #0

## 2019-05-18 MED FILL — traZODone HCL 50 MG TABS: 50 | 30 days supply | Qty: 60 | Fill #3

## 2019-06-08 DIAGNOSIS — H524 Presbyopia: Secondary | ICD-10-CM | POA: Diagnosis not present

## 2019-06-08 DIAGNOSIS — H52221 Regular astigmatism, right eye: Secondary | ICD-10-CM | POA: Diagnosis not present

## 2019-06-08 DIAGNOSIS — H5203 Hypermetropia, bilateral: Secondary | ICD-10-CM | POA: Diagnosis not present

## 2019-06-29 DIAGNOSIS — S93491A Sprain of other ligament of right ankle, initial encounter: Secondary | ICD-10-CM | POA: Diagnosis not present

## 2019-06-29 MED FILL — traZODone HCL 50 MG TABS: 50 | 30 days supply | Qty: 60 | Fill #4

## 2019-07-19 MED FILL — traZODone HCL 50 MG TABS: 50 | 30 days supply | Qty: 60 | Fill #4

## 2019-09-08 MED FILL — traZODone HCL 50 MG TABS: 50 | 30 days supply | Qty: 60 | Fill #5

## 2019-09-28 ENCOUNTER — Ambulatory Visit: Payer: 59 | Admitting: Nurse Practitioner

## 2019-10-14 ENCOUNTER — Ambulatory Visit: Payer: 59 | Admitting: Nurse Practitioner

## 2019-10-14 ENCOUNTER — Other Ambulatory Visit: Payer: Self-pay

## 2019-10-14 ENCOUNTER — Encounter: Payer: Self-pay | Admitting: Nurse Practitioner

## 2019-10-14 VITALS — BP 118/86 | HR 71 | Temp 97.8°F | Ht 69.0 in | Wt 237.8 lb

## 2019-10-14 DIAGNOSIS — F411 Generalized anxiety disorder: Secondary | ICD-10-CM | POA: Diagnosis not present

## 2019-10-14 DIAGNOSIS — G43019 Migraine without aura, intractable, without status migrainosus: Secondary | ICD-10-CM | POA: Diagnosis not present

## 2019-10-14 MED ORDER — TRAZODONE HCL 50 MG PO TABS: 100.0000 mg | ORAL_TABLET | Freq: Every day | ORAL | 2 refills | Status: DC

## 2019-10-14 MED ORDER — RIZATRIPTAN BENZOATE 10 MG PO TBDP: 10.0000 mg | ORAL_TABLET | ORAL | 0 refills | Status: DC | PRN

## 2019-10-14 MED FILL — traZODone HCL 50 MG TABS: 50 | 30 days supply | Qty: 60 | Fill #0

## 2019-10-14 MED FILL — RIZATRIPTAN 10 MG ODT: 10 | 17 days supply | Qty: 10 | Fill #0

## 2019-10-14 NOTE — Progress Notes (Signed)
Subjective:  Patient ID: Sherri Miller, female    DOB: 04-May-1976  Age: 43 y.o. MRN: [id number]  CC: Follow-up (medication refill for trazodone and maxalt?)  HPI She declined to get CPE completed today.  Migraine Headache: No change in characteristics or frequency. Use of maxalt prn.   Anxiety: Stable with use of trazodone Depression screen [facility] 2/9 10/14/2019 10/28/2018 08/06/2018  Decreased Interest 0 0 2  Down, Depressed, Hopeless 0 1 3  PHQ - 2 Score 0 1 5  Altered sleeping - 0 3  Tired, decreased energy - 0 3  Change in appetite - 0 3  Feeling bad or failure about yourself  - 0 2  Trouble concentrating - 0 -  Moving slowly or fidgety/restless - 0 0  Suicidal thoughts - 0 1  PHQ-9 Score - 1 17   GAD 7 : Generalized Anxiety Score 10/28/2018 08/06/2018 08/06/2018  Nervous, Anxious, on Edge 1 3 3   Control/stop worrying 1 3 3   Worry too much - different things 1 3 3   Trouble relaxing 0 3 3  Restless 0 0 0  Easily annoyed or irritable 1 3 3   Afraid - awful might happen 0 0 0  Total GAD 7 Score 4 15 15     Reviewed past Medical, Social and Family history today.  Outpatient Medications Prior to Visit  Medication Sig Dispense Refill  . albuterol (PROVENTIL HFA;VENTOLIN HFA) 108 (90 Base) MCG/ACT inhaler Inhale 2 puffs into the lungs every 6 (six) hours as needed for wheezing or shortness of breath. 1 Inhaler 0  . montelukast (SINGULAIR) 10 MG tablet Take 1 tablet (10 mg total) by mouth at bedtime. 30 tablet 0  . ondansetron (ZOFRAN) 8 MG tablet Take 8 mg by mouth every 8 (eight) hours as needed for nausea.     . drospirenone-ethinyl estradiol (YAZ) 3-0.02 MG tablet Take 1 tablet by mouth daily before breakfast.    . HYDROcodone-acetaminophen (NORCO/VICODIN) 5-325 MG tablet Take 1-2 tablets by mouth at bedtime as needed for moderate pain. 10 tablet 0  . rizatriptan (MAXALT-MLT) 10 MG disintegrating tablet Take 10 mg by mouth as needed for migraine. May repeat in 2 hours if  needed    . traZODone (DESYREL) 50 MG tablet Take 2 tablets (100 mg total) by mouth at bedtime. 60 tablet 0  . drospirenone-ethinyl estradiol (YAZ,GIANVI,LORYNA) 3-0.02 MG tablet Take 1 tablet by mouth daily.    Marland Kitchen gabapentin (NEURONTIN) 600 MG tablet Take 600 mg by mouth 3 (three) times daily.    Marland Kitchen HYDROcodone-acetaminophen (NORCO/VICODIN) 5-325 MG tablet Take by mouth.    . benzonatate (TESSALON) 100 MG capsule Take 1-2 capsules (100-200 mg total) by mouth 3 (three) times daily as needed for cough (with full glass of water). (Patient not taking: Reported on 10/14/2019) 30 capsule 0  . doxycycline (VIBRA-TABS) 100 MG tablet Take 1 tablet (100 mg total) by mouth 2 (two) times daily. (Patient not taking: Reported on 10/14/2019) 20 tablet 0  . guaiFENesin-codeine (CHERATUSSIN AC) 100-10 MG/5ML syrup Take 5 mLs by mouth every evening. (Patient not taking: Reported on 10/14/2019) 65 mL 0  . methocarbamol (ROBAXIN) 500 MG tablet Take 1 tablet (500 mg total) by mouth 4 (four) times daily. (Patient not taking: Reported on 10/14/2019) 30 tablet 0  . oseltamivir (TAMIFLU) 75 MG capsule Take 1 capsule (75 mg total) by mouth 2 (two) times daily. (Patient not taking: Reported on 10/14/2019) 10 capsule 0  . predniSONE (DELTASONE) 10 MG tablet Take 1 tablet (  10 mg total) by mouth daily. 40 mg once daily for 2 days,  30mg  once for 2 days, 20 mg once for 2 days,10 mg once  for 2 day (Patient not taking: Reported on 10/14/2019) 30 tablet 0  . promethazine-dextromethorphan (PROMETHAZINE-DM) 6.25-15 MG/5ML syrup Take 5 mLs by mouth 3 (three) times daily as needed for cough. (Patient not taking: Reported on 01/27/2019) 118 mL 0   No facility-administered medications prior to visit.     ROS See HPI  Objective:  BP 118/86   Pulse 71   Temp 97.8 F (36.6 C) (Tympanic)   Ht 5\' 9"  (1.753 m)   Wt 237 lb 12.8 oz (107.9 kg)   SpO2 97%   BMI 35.12 kg/m   BP Readings from Last 3 Encounters:  10/14/19 118/86  01/27/19  120/82  12/29/18 130/82    Wt Readings from Last 3 Encounters:  10/14/19 237 lb 12.8 oz (107.9 kg)  01/27/19 224 lb 9.6 oz (101.9 kg)  12/29/18 224 lb (101.6 kg)    Physical Exam Vitals signs reviewed.  Neck:     Musculoskeletal: Normal range of motion and neck supple.  Cardiovascular:     Rate and Rhythm: Normal rate and regular rhythm.     Pulses: Normal pulses.     Heart sounds: Normal heart sounds.  Pulmonary:     Effort: Pulmonary effort is normal.     Breath sounds: Normal breath sounds.  Musculoskeletal:     Right lower leg: No edema.     Left lower leg: No edema.  Lymphadenopathy:     Cervical: No cervical adenopathy.  Neurological:     Mental Status: She is alert and oriented to person, place, and time.  Psychiatric:        Attention and Perception: Attention normal.        Mood and Affect: Mood normal.        Speech: Speech normal.        Behavior: Behavior is cooperative.        Cognition and Memory: Cognition normal.     Lab Results  Component Value Date   WBC 9.9 03/26/2015   HGB 12.5 03/26/2015   HCT 37.4 (A) 03/26/2015   PLT 283 07/21/2007   GLUCOSE 98 07/21/2007   NA 139 07/21/2007   K 3.2 (L) 07/21/2007   CL 104 07/21/2007   CREATININE 1.0 07/21/2007   BUN 12 07/21/2007    Mm Screening Breast Tomo Bilateral  Result Date: 01/08/2017 CLINICAL DATA:  Screening. Baseline. EXAM: 2D DIGITAL SCREENING BILATERAL MAMMOGRAM WITH CAD AND ADJUNCT TOMO COMPARISON:  None. ACR Breast Density Category b: There are scattered areas of fibroglandular density. FINDINGS: There are no findings suspicious for malignancy. Images were processed with CAD. IMPRESSION: No mammographic evidence of malignancy. A result letter of this screening mammogram will be mailed directly to the patient. RECOMMENDATION: Screening mammogram in one year. (Code:SM-B-01Y) BI-RADS CATEGORY  1: Negative. Electronically Signed   By: Hulan Saashomas  Lawrence M.D.   On: 01/08/2017 15:35    Assessment &  Plan:   [name] was seen today for follow-up.  Diagnoses and all orders for this visit:  Intractable migraine without aura and without status migrainosus -     rizatriptan (MAXALT-MLT) 10 MG disintegrating tablet; Take 1 tablet (10 mg total) by mouth as needed for migraine. May repeat in 2 hours if needed. No more than 30mg  in 24hrs  GAD (generalized anxiety disorder) -     traZODone (DESYREL) 50 MG  tablet; Take 2 tablets (100 mg total) by mouth at bedtime.   I have discontinued Rahmah L. White's promethazine-dextromethorphan, doxycycline, benzonatate, guaiFENesin-codeine, methocarbamol, predniSONE, and oseltamivir. I have also changed her rizatriptan. Additionally, I am having her maintain her ondansetron, drospirenone-ethinyl estradiol, gabapentin, HYDROcodone-acetaminophen, montelukast, albuterol, and traZODone.  Meds ordered this encounter  Medications  . rizatriptan (MAXALT-MLT) 10 MG disintegrating tablet    Sig: Take 1 tablet (10 mg total) by mouth as needed for migraine. May repeat in 2 hours if needed. No more than 30mg  in 24hrs    Dispense:  10 tablet    Refill:  0    Order Specific Question:   Supervising Provider    Answer:   [[id number]]  . traZODone (DESYREL) 50 MG tablet    Sig: Take 2 tablets (100 mg total) by mouth at bedtime.    Dispense:  60 tablet    Refill:  2    Order Specific Question:   Supervising Provider    Answer:   Dianne Dun [[id number]]    Problem List Items Addressed This Visit      Cardiovascular and Mediastinum   Headache, menstrual migraine - Primary   Relevant Medications   rizatriptan (MAXALT-MLT) 10 MG disintegrating tablet   traZODone (DESYREL) 50 MG tablet     Other   GAD (generalized anxiety disorder)    she reports significant improvement with trazodone, but feels like she hit a plato after the first month. She will like to increase trazodone dose. Denies any adverse effects with medication. Has use klonopin as needed for panic  attacks. Reports imroved performance at work.  Continue trazodone 100mg  at hs. Schedule appt for CPE within the next 26months in order to get additional refills.        Relevant Medications   traZODone (DESYREL) 50 MG tablet       Follow-up: Return in about 4 weeks (around 11/11/2019) for CPE(fasting).  1month, NP

## 2019-10-14 NOTE — Patient Instructions (Signed)
Schedule appt for CPE within the next 30months in order to get additional refills.  Make changes to diet (heart healthy)  Sign medical release to get recent PAP results fron GYN ([facility]).

## 2019-10-14 NOTE — Assessment & Plan Note (Signed)
she reports significant improvement with trazodone, but feels like she hit a plato after the first month. She will like to increase trazodone dose. Denies any adverse effects with medication. Has use klonopin as needed for panic attacks. Reports imroved performance at work.  Continue trazodone 100mg  at hs. Schedule appt for CPE within the next 68months in order to get additional refills.

## 2019-11-29 MED FILL — DROSPIR-ETH ESTRA 3/.02 MG: 3-0.02 | 72 days supply | Qty: 84 | Fill #2

## 2019-11-29 MED FILL — traZODone HCL 50 MG TABS: 50 | 30 days supply | Qty: 60 | Fill #1

## 2019-12-22 ENCOUNTER — Telehealth: Payer: Self-pay | Admitting: Internal Medicine

## 2019-12-22 ENCOUNTER — Other Ambulatory Visit: Payer: Self-pay | Admitting: Nurse Practitioner

## 2019-12-22 DIAGNOSIS — G43019 Migraine without aura, intractable, without status migrainosus: Secondary | ICD-10-CM

## 2019-12-22 MED ORDER — FLUTICASONE FUROATE-VILANTEROL 100-25 MCG/INH IN AEPB: 1.0000 | INHALATION_SPRAY | Freq: Every day | RESPIRATORY_TRACT | 11 refills | Status: DC

## 2019-12-22 MED ORDER — FAMOTIDINE 20 MG PO TABS: 20.0000 mg | ORAL_TABLET | Freq: Two times a day (BID) | ORAL | 11 refills | Status: DC

## 2019-12-22 MED ORDER — MONTELUKAST SODIUM 10 MG PO TABS: 10.0000 mg | ORAL_TABLET | Freq: Every day | ORAL | 11 refills | Status: DC

## 2019-12-22 MED FILL — BREO ELLIPTA 100-25 MCG INH: 100-25 | 30 days supply | Qty: 60 | Fill #0

## 2019-12-22 MED FILL — MONTELUKAST SOD 10 MG TAB: 10 | 30 days supply | Qty: 30 | Fill #0

## 2019-12-22 MED FILL — RIZATRIPTAN 10 MG ODT: 10 | 17 days supply | Qty: 10 | Fill #0

## 2019-12-22 MED FILL — FAMOTIDINE 20 MG TABS: 20 | 30 days supply | Qty: 60 | Fill #0

## 2019-12-22 NOTE — Telephone Encounter (Signed)
Called for worsening shortness of breath and sinus issues with allergic asthma and GERD.  Will do singulair and PRN breo per SMART trial + resume pepcid.  Told to f/u with PCP if symptoms fail to resolve  Myrla Halsted MD PCCM

## 2019-12-22 NOTE — Telephone Encounter (Signed)
Charlotte please advise, ok to send in?  Last ov with 10/14/2019-- Last refill was 10/14/2019 for 10 tab

## 2019-12-27 ENCOUNTER — Telehealth: Payer: Self-pay | Admitting: Internal Medicine

## 2019-12-27 ENCOUNTER — Telehealth: Payer: Self-pay | Admitting: Nurse Practitioner

## 2019-12-27 MED ORDER — FLUTICASONE-SALMETEROL 250-50 MCG/DOSE IN AEPB: 1.0000 | INHALATION_SPRAY | Freq: Two times a day (BID) | RESPIRATORY_TRACT | 11 refills | Status: DC

## 2019-12-27 MED FILL — ADVAIR 250/50 DISKUS: 250-50 | 30 days supply | Qty: 60 | Fill #0

## 2019-12-27 NOTE — Telephone Encounter (Signed)
advair covered better.

## 2019-12-27 NOTE — Telephone Encounter (Signed)
Called about message sent in: Appointment Request From: Sherri Miller    With Provider: Alysia Penna, NP [[facility]]    Preferred Date Range: Any date 12/28/2019 or later    Preferred Times: Any Time    Reason for visit: Annual Physical    Comments:  Physical    Left message

## 2020-01-09 DIAGNOSIS — I2699 Other pulmonary embolism without acute cor pulmonale: Secondary | ICD-10-CM

## 2020-01-09 HISTORY — DX: Other pulmonary embolism without acute cor pulmonale: I26.99

## 2020-01-10 ENCOUNTER — Encounter (HOSPITAL_BASED_OUTPATIENT_CLINIC_OR_DEPARTMENT_OTHER): Payer: Self-pay

## 2020-01-10 ENCOUNTER — Emergency Department (HOSPITAL_BASED_OUTPATIENT_CLINIC_OR_DEPARTMENT_OTHER): Payer: 59

## 2020-01-10 ENCOUNTER — Inpatient Hospital Stay (HOSPITAL_BASED_OUTPATIENT_CLINIC_OR_DEPARTMENT_OTHER)
Admission: EM | Admit: 2020-01-10 | Discharge: 2020-01-11 | DRG: 175 | Disposition: A | Discharge status: Home / Self Care | Payer: 59 | Attending: Pulmonary Disease | Admitting: Pulmonary Disease

## 2020-01-10 ENCOUNTER — Other Ambulatory Visit: Payer: Self-pay

## 2020-01-10 DIAGNOSIS — R0602 Shortness of breath: Secondary | ICD-10-CM | POA: Diagnosis not present

## 2020-01-10 DIAGNOSIS — Z880 Allergy status to penicillin: Secondary | ICD-10-CM

## 2020-01-10 DIAGNOSIS — Z793 Long term (current) use of hormonal contraceptives: Secondary | ICD-10-CM

## 2020-01-10 DIAGNOSIS — Z885 Allergy status to narcotic agent status: Secondary | ICD-10-CM

## 2020-01-10 DIAGNOSIS — Z20822 Contact with and (suspected) exposure to covid-19: Secondary | ICD-10-CM | POA: Diagnosis not present

## 2020-01-10 DIAGNOSIS — F909 Attention-deficit hyperactivity disorder, unspecified type: Secondary | ICD-10-CM | POA: Diagnosis present

## 2020-01-10 DIAGNOSIS — Z79899 Other long term (current) drug therapy: Secondary | ICD-10-CM

## 2020-01-10 DIAGNOSIS — R091 Pleurisy: Secondary | ICD-10-CM | POA: Diagnosis not present

## 2020-01-10 DIAGNOSIS — J309 Allergic rhinitis, unspecified: Secondary | ICD-10-CM | POA: Diagnosis present

## 2020-01-10 DIAGNOSIS — I2609 Other pulmonary embolism with acute cor pulmonale: Principal | ICD-10-CM | POA: Diagnosis present

## 2020-01-10 DIAGNOSIS — F411 Generalized anxiety disorder: Secondary | ICD-10-CM | POA: Diagnosis present

## 2020-01-10 DIAGNOSIS — M79661 Pain in right lower leg: Secondary | ICD-10-CM | POA: Diagnosis not present

## 2020-01-10 DIAGNOSIS — I2699 Other pulmonary embolism without acute cor pulmonale: Secondary | ICD-10-CM | POA: Diagnosis present

## 2020-01-10 DIAGNOSIS — Z79891 Long term (current) use of opiate analgesic: Secondary | ICD-10-CM

## 2020-01-10 DIAGNOSIS — I2602 Saddle embolus of pulmonary artery with acute cor pulmonale: Secondary | ICD-10-CM | POA: Diagnosis not present

## 2020-01-10 DIAGNOSIS — G43829 Menstrual migraine, not intractable, without status migrainosus: Secondary | ICD-10-CM | POA: Diagnosis present

## 2020-01-10 DIAGNOSIS — F329 Major depressive disorder, single episode, unspecified: Secondary | ICD-10-CM | POA: Diagnosis present

## 2020-01-10 LAB — BASIC METABOLIC PANEL
Anion gap: 8 (ref 5–15)
BUN: 11 mg/dL (ref 6–20)
CO2: 24 mmol/L (ref 22–32)
Calcium: 9.2 mg/dL (ref 8.9–10.3)
Chloride: 105 mmol/L (ref 98–111)
Creatinine, Ser: 0.86 mg/dL (ref 0.44–1.00)
GFR calc Af Amer: >60 mL/min (ref >60)
GFR calc non Af Amer: >60 mL/min (ref >60)
Glucose, Bld: 97 mg/dL (ref 70–99)
Potassium: 3.7 mmol/L (ref 3.5–5.1)
Sodium: 137 mmol/L (ref 135–145)

## 2020-01-10 LAB — SARS CORONAVIRUS 2 BY RT PCR (HOSPITAL ORDER, PERFORMED IN ~~LOC~~ HOSPITAL LAB): SARS Coronavirus 2: NEGATIVE

## 2020-01-10 LAB — CBC WITH DIFFERENTIAL/PLATELET
Abs Immature Granulocytes: 0.03 10*3/uL (ref 0.00–0.07)
Basophils Absolute: 0.1 10*3/uL (ref 0.0–0.1)
Basophils Relative: 1 %
Eosinophils Absolute: 0.2 10*3/uL (ref 0.0–0.5)
Eosinophils Relative: 2 %
HCT: 40.6 % (ref 36.0–46.0)
Hemoglobin: 13.2 g/dL (ref 12.0–15.0)
Immature Granulocytes: 0 %
Lymphocytes Relative: 20 %
Lymphs Abs: 1.9 10*3/uL (ref 0.7–4.0)
MCH: 28.1 pg (ref 26.0–34.0)
MCHC: 32.5 g/dL (ref 30.0–36.0)
MCV: 86.4 fL (ref 80.0–100.0)
Monocytes Absolute: 0.7 10*3/uL (ref 0.1–1.0)
Monocytes Relative: 8 %
Neutro Abs: 6.3 10*3/uL (ref 1.7–7.7)
Neutrophils Relative %: 69 %
Platelets: 159 10*3/uL (ref 150–400)
RBC: 4.7 MIL/uL (ref 3.87–5.11)
RDW: 12.2 % (ref 11.5–15.5)
WBC: 9.2 10*3/uL (ref 4.0–10.5)
nRBC: 0 % (ref 0.0–0.2)

## 2020-01-10 LAB — URINALYSIS, MICROSCOPIC (REFLEX): WBC, UA: NONE SEEN WBC/hpf (ref 0–5)

## 2020-01-10 LAB — URINALYSIS, ROUTINE W REFLEX MICROSCOPIC
Bilirubin Urine: NEGATIVE
Glucose, UA: NEGATIVE mg/dL
Ketones, ur: NEGATIVE mg/dL
Leukocytes,Ua: NEGATIVE
Nitrite: NEGATIVE
Protein, ur: NEGATIVE mg/dL
Specific Gravity, Urine: 1.01 (ref 1.005–1.030)
pH: 7 (ref 5.0–8.0)

## 2020-01-10 LAB — TROPONIN I (HIGH SENSITIVITY): Troponin I (High Sensitivity): 3 ng/L (ref <18)

## 2020-01-10 LAB — BRAIN NATRIURETIC PEPTIDE: B Natriuretic Peptide: 14.1 pg/mL (ref 0.0–100.0)

## 2020-01-10 LAB — PREGNANCY, URINE: Preg Test, Ur: NEGATIVE

## 2020-01-10 MED ORDER — HEPARIN (PORCINE) 25000 UT/250ML-% IV SOLN: 14.0000 [IU]/kg/h | INTRAVENOUS | Status: DC

## 2020-01-10 MED ORDER — HEPARIN SODIUM (PORCINE) 5000 UNIT/ML IJ SOLN: 4000.0000 [IU] | Freq: Once | INTRAMUSCULAR | Status: DC

## 2020-01-10 MED ORDER — HEPARIN (PORCINE) 25000 UT/250ML-% IV SOLN
1400.0000 [IU]/h | INTRAVENOUS | Status: DC
  Administered 2020-01-10: 1400 [IU]/h via INTRAVENOUS
  Filled 2020-01-10: qty 250

## 2020-01-10 MED ORDER — FAMOTIDINE 20 MG PO TABS
20.0000 mg | ORAL_TABLET | Freq: Two times a day (BID) | ORAL | Status: DC
  Administered 2020-01-11 (×2): 20 mg via ORAL
  Filled 2020-01-10 (×2): qty 1

## 2020-01-10 MED ORDER — GABAPENTIN 600 MG PO TABS
600.0000 mg | ORAL_TABLET | Freq: Three times a day (TID) | ORAL | Status: DC
  Filled 2020-01-10 (×3): qty 1

## 2020-01-10 MED ORDER — IOHEXOL 350 MG/ML SOLN
100.0000 mL | Freq: Once | INTRAVENOUS | Status: AC
  Administered 2020-01-10: 21:00:00 100 mL via INTRAVENOUS

## 2020-01-10 MED ORDER — MOMETASONE FURO-FORMOTEROL FUM 200-5 MCG/ACT IN AERO
2.0000 | INHALATION_SPRAY | Freq: Two times a day (BID) | RESPIRATORY_TRACT | Status: DC
  Filled 2020-01-10: qty 8.8

## 2020-01-10 MED ORDER — IOHEXOL 350 MG/ML SOLN: 80.0000 mL | Freq: Once | INTRAVENOUS | Status: DC | PRN

## 2020-01-10 MED ORDER — TRAZODONE HCL 100 MG PO TABS
100.0000 mg | ORAL_TABLET | Freq: Every day | ORAL | Status: DC
  Administered 2020-01-11: 03:00:00 100 mg via ORAL
  Filled 2020-01-10: qty 1

## 2020-01-10 MED ORDER — HEPARIN BOLUS VIA INFUSION
5000.0000 [IU] | Freq: Once | INTRAVENOUS | Status: AC
  Administered 2020-01-10: 5000 [IU] via INTRAVENOUS

## 2020-01-10 MED ORDER — MONTELUKAST SODIUM 10 MG PO TABS
10.0000 mg | ORAL_TABLET | Freq: Every day | ORAL | Status: DC
  Administered 2020-01-11: 03:00:00 10 mg via ORAL
  Filled 2020-01-10 (×2): qty 1

## 2020-01-10 MED ORDER — SODIUM CHLORIDE 0.9 % IV SOLN: INTRAVENOUS | Status: DC

## 2020-01-10 MED ORDER — FLUTICASONE FUROATE-VILANTEROL 100-25 MCG/INH IN AEPB
1.0000 | INHALATION_SPRAY | Freq: Every day | RESPIRATORY_TRACT | Status: DC
  Filled 2020-01-10: qty 28

## 2020-01-10 NOTE — ED Notes (Signed)
ED Provider at bedside. 

## 2020-01-10 NOTE — ED Provider Notes (Signed)
[facility] EMERGENCY DEPARTMENT Provider Note   CSN: [id number] Arrival date & time: 01/10/20  1827     History Chief Complaint  Patient presents with  . Shortness of Breath    Sherri Miller is a 44 y.o. female.  Patient is an ICU nurse at [employer].  Patient with shortness of breath kinase started on January 30.'s been got a little bit worse.  Started with some right calf discomfort but no leg swelling on January 31.  The dyspnea on exertion has gotten progressively worse.  Particularly today.  Not associated with any significant chest pain.  No syncope.  No fevers no cough.  Patient's had both of her Covid vaccines the second 1 was administered 2 weeks ago.  Past medical history significant for migraines.  Patient also occasionally has some problems with bronchitis or reactive airway disease.  Patient is on Brio Ellipta as well as Advair Diskus.  Patient also on Neurontin and on singular.  Patient on trazodone.  Patient without any injury to the leg.  Left leg without any discomfort at all.  With the dyspnea on exertion patient would get some tachycardia.        Past Medical History:  Diagnosis Date  . Allergy   . Chicken pox   . Depression   . Migraine   . Migraines     Patient Active Problem List   Diagnosis Date Noted  . Pulmonary embolus (HCC) 01/10/2020  . Lumbar degenerative disc disease 05/19/2014  . Lumbar radiculopathy 05/19/2014  . Lumbar spondylosis 05/19/2014  . GAD (generalized anxiety disorder) 08/24/2009  . URI 02/06/2009  . Depression 01/03/2008  . ADHD 01/03/2008  . Headache, menstrual migraine 01/03/2008  . OTHER ACUTE SINUSITIS 01/03/2008  . Allergic rhinitis 01/03/2008  . HEADACHE 01/03/2008    History reviewed. No pertinent surgical history.   OB History   No obstetric history on file.     Family History  Adopted: Yes  Family history unknown: Yes    Social History   Tobacco Use  . Smoking status: Never Smoker  . Smokeless  tobacco: Never Used  Substance Use Topics  . Alcohol use: No    Alcohol/week: 0.0 standard drinks  . Drug use: No    Home Medications Prior to Admission medications   Medication Sig Start Date End Date Taking? Authorizing Provider  drospirenone-ethinyl estradiol (YAZ,GIANVI,LORYNA) 3-0.02 MG tablet Take 1 tablet by mouth daily.    [provider]  famotidine (PEPCID) 20 MG tablet Take 1 tablet (20 mg total) by mouth 2 (two) times daily. 12/22/19 12/21/20  Lorin Glass, MD  fluticasone furoate-vilanterol (BREO ELLIPTA) 100-25 MCG/INH AEPB Inhale 1 puff into the lungs daily. 12/22/19   Lorin Glass, MD  Fluticasone-Salmeterol (ADVAIR DISKUS) 250-50 MCG/DOSE AEPB Inhale 1 puff into the lungs 2 (two) times daily. 12/27/19   Lorin Glass, MD  gabapentin (NEURONTIN) 600 MG tablet Take 600 mg by mouth 3 (three) times daily.    [provider]  HYDROcodone-acetaminophen (NORCO/VICODIN) 5-325 MG tablet Take by mouth. 01/25/16   [provider]  montelukast (SINGULAIR) 10 MG tablet Take 1 tablet (10 mg total) by mouth at bedtime. 12/22/19   Lorin Glass, MD  ondansetron (ZOFRAN) 8 MG tablet Take 8 mg by mouth every 8 (eight) hours as needed for nausea.     Linus Salmons, MD  rizatriptan (MAXALT-MLT) 10 MG disintegrating tablet DISSOLVE 1 TABLET BY MOUTH AS NEEDED FOR MIGRAINE, MAY REPEAT IN 2  HOURS IF NEEDED. NO MORE THAN 30MG  IN 24 HOURS 12/22/19   Nche, 12/24/19, NP  traZODone (DESYREL) 50 MG tablet Take 2 tablets (100 mg total) by mouth at bedtime. 10/14/19   Nche, 13/6/20, NP    Allergies    Codeine and Penicillins  Review of Systems   Review of Systems  Constitutional: Negative for chills and fever.  HENT: Negative for congestion, rhinorrhea and sore throat.   Eyes: Negative for visual disturbance.  Respiratory: Positive for shortness of breath. Negative for cough.   Cardiovascular: Negative for chest pain and leg swelling.  Gastrointestinal:  Negative for abdominal pain, diarrhea, nausea and vomiting.  Genitourinary: Negative for dysuria.  Musculoskeletal: Negative for back pain and neck pain.  Skin: Negative for rash.  Neurological: Negative for dizziness, light-headedness and headaches.  Hematological: Does not bruise/bleed easily.  Psychiatric/Behavioral: Negative for confusion.    Physical Exam Updated Vital Signs BP (!) 151/83   Pulse (!) 105   Temp 98.7 F (37.1 C) (Oral)   Resp (!) 21   Ht 1.727 m (5\' 8" )   Wt 103 kg   SpO2 99%   BMI 34.52 kg/m   Physical Exam Vitals and nursing note reviewed.  Constitutional:      General: She is not in acute distress.    Appearance: She is well-developed. She is not toxic-appearing or diaphoretic.  HENT:     Head: Normocephalic and atraumatic.  Eyes:     Conjunctiva/sclera: Conjunctivae normal.     Pupils: Pupils are equal, round, and reactive to light.  Cardiovascular:     Rate and Rhythm: Regular rhythm. Tachycardia present.     Heart sounds: No murmur.  Pulmonary:     Effort: Pulmonary effort is normal. No respiratory distress.     Breath sounds: Normal breath sounds.  Abdominal:     Palpations: Abdomen is soft.     Tenderness: There is no abdominal tenderness.  Musculoskeletal:        General: No swelling or tenderness.     Cervical back: Normal range of motion and neck supple.     Comments: Right leg no swelling.  Dorsalis pedis pulses 2+ in both feet left and right.  No erythema to the right leg no calf tenderness to palpation.  No swelling at the knee joint.  Skin:    General: Skin is warm and dry.     Capillary Refill: Capillary refill takes less than 2 seconds.  Neurological:     General: No focal deficit present.     Mental Status: She is alert and oriented to person, place, and time.     ED Results / Procedures / Treatments   Labs (all labs ordered are listed, but only abnormal results are displayed) Labs Reviewed  SARS CORONAVIRUS 2 (TAT 6-24  HRS)  SARS CORONAVIRUS 2 BY RT PCR (HOSPITAL ORDER, PERFORMED IN [place] [facility])  CBC WITH DIFFERENTIAL/PLATELET  BASIC METABOLIC PANEL  BRAIN NATRIURETIC PEPTIDE  PREGNANCY, URINE  URINALYSIS, ROUTINE W REFLEX MICROSCOPIC  TROPONIN I (HIGH SENSITIVITY)   Results for orders placed or performed during the hospital encounter of 01/10/20  SARS Coronavirus 2 by RT PCR (hospital order, performed in [facility] hospital lab) Nasopharyngeal Nasopharyngeal Swab   Specimen: Nasopharyngeal Swab  Result Value Ref Range   SARS Coronavirus 2 NEGATIVE NEGATIVE  CBC with Differential/Platelet  Result Value Ref Range   WBC 9.2 4.0 - 10.5 K/uL   RBC 4.70 3.87 - 5.11 MIL/uL  Hemoglobin 13.2 12.0 - 15.0 g/dL   HCT 40.6 36.0 - 46.0 %   MCV 86.4 80.0 - 100.0 fL   MCH 28.1 26.0 - 34.0 pg   MCHC 32.5 30.0 - 36.0 g/dL   RDW 12.2 11.5 - 15.5 %   Platelets 159 150 - 400 K/uL   nRBC 0.0 0.0 - 0.2 %   Neutrophils Relative % 69 %   Neutro Abs 6.3 1.7 - 7.7 K/uL   Lymphocytes Relative 20 %   Lymphs Abs 1.9 0.7 - 4.0 K/uL   Monocytes Relative 8 %   Monocytes Absolute 0.7 0.1 - 1.0 K/uL   Eosinophils Relative 2 %   Eosinophils Absolute 0.2 0.0 - 0.5 K/uL   Basophils Relative 1 %   Basophils Absolute 0.1 0.0 - 0.1 K/uL   Immature Granulocytes 0 %   Abs Immature Granulocytes 0.03 0.00 - 0.07 K/uL  Basic metabolic panel  Result Value Ref Range   Sodium 137 135 - 145 mmol/L   Potassium 3.7 3.5 - 5.1 mmol/L   Chloride 105 98 - 111 mmol/L   CO2 24 22 - 32 mmol/L   Glucose, Bld 97 70 - 99 mg/dL   BUN 11 6 - 20 mg/dL   Creatinine, Ser 0.86 0.44 - 1.00 mg/dL   Calcium 9.2 8.9 - 10.3 mg/dL   GFR calc non Af Amer >60 >60 mL/min   GFR calc Af Amer >60 >60 mL/min   Anion gap 8 5 - 15  Brain natriuretic peptide  Result Value Ref Range   B Natriuretic Peptide 14.1 0.0 - 100.0 pg/mL  Urinalysis, Routine w reflex microscopic  Result Value Ref Range   Color, Urine YELLOW YELLOW   APPearance CLEAR  CLEAR   Specific Gravity, Urine 1.010 1.005 - 1.030   pH 7.0 5.0 - 8.0   Glucose, UA NEGATIVE NEGATIVE mg/dL   Hgb urine dipstick TRACE (A) NEGATIVE   Bilirubin Urine NEGATIVE NEGATIVE   Ketones, ur NEGATIVE NEGATIVE mg/dL   Protein, ur NEGATIVE NEGATIVE mg/dL   Nitrite NEGATIVE NEGATIVE   Leukocytes,Ua NEGATIVE NEGATIVE  Pregnancy, urine  Result Value Ref Range   Preg Test, Ur NEGATIVE NEGATIVE  Urinalysis, Microscopic (reflex)  Result Value Ref Range   RBC / HPF 0-5 0 - 5 RBC/hpf   WBC, UA NONE SEEN 0 - 5 WBC/hpf   Bacteria, UA RARE (A) NONE SEEN   Squamous Epithelial / LPF 0-5 0 - 5  Troponin I (High Sensitivity)  Result Value Ref Range   Troponin I (High Sensitivity) 3 <18 ng/L     EKG EKG Interpretation  Date/Time:  Tuesday January 10 2020 18:37:43 EST Ventricular Rate:  104 PR Interval:    QRS Duration: 92 QT Interval:  348 QTC Calculation: 458 R Axis:   -15 Text Interpretation: Sinus tachycardia Borderline left axis deviation Low voltage, precordial leads Minimal ST depression, diffuse leads Confirmed by Fredia Sorrow [phone]) on 01/10/2020 6:42:35 PM   Radiology CT Angio Chest PE W/Cm &/Or Wo Cm  Result Date: 01/10/2020 CLINICAL DATA:  Shortness of breath EXAM: CT ANGIOGRAPHY CHEST WITH CONTRAST TECHNIQUE: Multidetector CT imaging of the chest was performed using the standard protocol during bolus administration of intravenous contrast. Multiplanar CT image reconstructions and MIPs were obtained to evaluate the vascular anatomy. CONTRAST:  <See Chart> OMNIPAQUE IOHEXOL 350 MG/ML SOLN COMPARISON:  None. FINDINGS: Cardiovascular: Contrast injection is sufficient to demonstrate satisfactory opacification of the pulmonary arteries to the segmental level.There are acute bilateral pulmonary emboli.  On the left the distribution is lobar, segmental, and subsegmental. On the right, the distribution is lobar in the right upper lobe and segmental and subsegmental in the right  middle and right lower lobes. There is CT evidence for right-sided heart strain with an RV/LV ratio measuring approximately 0.95. The heart size is normal. There is no significant pericardial effusion. There is no evidence for thoracic aortic aneurysm or dissection. Mediastinum/Nodes: --No mediastinal or hilar lymphadenopathy. --No axillary lymphadenopathy. --No supraclavicular lymphadenopathy. --Normal thyroid gland. --The esophagus is unremarkable Lungs/Pleura: No pulmonary nodules or masses. No pleural effusion or pneumothorax. No focal airspace consolidation. No focal pleural abnormality. Upper Abdomen: No acute abnormality. Musculoskeletal: No chest wall abnormality. No acute or significant osseous findings. Review of the MIP images confirms the above findings. IMPRESSION: Acute bilateral pulmonary emboli, left greater than right, with CT evidence for right-sided heart strain (RV/LV Ratio = 0.95.) consistent with at least submassive (intermediate risk) PE. The presence of right heart strain has been associated with an increased risk of morbidity and mortality. These results were called by telephone at the time of interpretation on 01/10/2020 at 9:26 pm to provider Vanetta MuldersSCOTT Kareen Jefferys , who verbally acknowledged these results. Electronically Signed   By: Katherine Mantlehristopher  Green M.D.   On: 01/10/2020 21:29   US Venous Img Lower Right (DVT Study)  Result Date: 01/10/2020 CLINICAL DATA:  Shortness of breath and calf pain EXAM: Right  LOWER EXTREMITY VENOUS DOPPLER ULTRASOUND TECHNIQUE: Gray-scale sonography with graded compression, as well as color Doppler and duplex ultrasound were performed to evaluate the lower extremity deep venous systems from the level of the common femoral vein and including the common femoral, femoral, profunda femoral, popliteal and calf veins including the posterior tibial, peroneal and gastrocnemius veins when visible. The superficial great saphenous vein was also interrogated. Spectral Doppler  was utilized to evaluate flow at rest and with distal augmentation maneuvers in the common femoral, femoral and popliteal veins. COMPARISON:  None. FINDINGS: Contralateral Common Femoral Vein: Respiratory phasicity is normal and symmetric with the symptomatic side. No evidence of thrombus. Normal compressibility. Common Femoral Vein: No evidence of thrombus. Normal compressibility, respiratory phasicity and response to augmentation. Saphenofemoral Junction: No evidence of thrombus. Normal compressibility and flow on color Doppler imaging. Profunda Femoral Vein: No evidence of thrombus. Normal compressibility and flow on color Doppler imaging. Femoral Vein: No evidence of thrombus. Normal compressibility, respiratory phasicity and response to augmentation. Popliteal Vein: No evidence of thrombus. Normal compressibility, respiratory phasicity and response to augmentation. Calf Veins: No evidence of thrombus. Normal compressibility and flow on color Doppler imaging. Superficial Great Saphenous Vein: No evidence of thrombus. Normal compressibility. Venous Reflux:  None. Other Findings:  None. IMPRESSION: No evidence of deep venous thrombosis. Electronically Signed   By: Jonna ClarkBindu  Avutu M.D.   On: 01/10/2020 20:20   DG Chest Port 1 View  Result Date: 01/10/2020 CLINICAL DATA:  44 year old female with shortness of breath. EXAM: PORTABLE CHEST 1 VIEW COMPARISON:  Chest radiograph dated 03/26/2015. FINDINGS: The heart size and mediastinal contours are within normal limits. Both lungs are clear. The visualized skeletal structures are unremarkable. IMPRESSION: No active disease. Electronically Signed   By: Elgie CollardArash  Radparvar M.D.   On: 01/10/2020 18:54    Procedures Procedures (including critical care time)  CRITICAL CARE Performed by: Vanetta MuldersScott Aziah Brostrom Total critical care time: 30 minutes Critical care time was exclusive of separately billable procedures and treating other patients. Critical care was necessary to treat  or prevent imminent or life-threatening  deterioration. Critical care was time spent personally by me on the following activities: development of treatment plan with patient and/or surrogate as well as nursing, discussions with consultants, evaluation of patient's response to treatment, examination of patient, obtaining history from patient or surrogate, ordering and performing treatments and interventions, ordering and review of laboratory studies, ordering and review of radiographic studies, pulse oximetry and re-evaluation of patient's condition.   Medications Ordered in ED Medications  0.9 %  sodium chloride infusion ( Intravenous New Bag/Given 01/10/20 2149)  heparin ADULT infusion 100 units/mL (25000 units/234mL sodium chloride 0.45%) (1,400 Units/hr Intravenous New Bag/Given 01/10/20 2152)  iohexol (OMNIPAQUE) 350 MG/ML injection 100 mL (100 mLs Intravenous Contrast Given 01/10/20 2107)  heparin bolus via infusion 5,000 Units (5,000 Units Intravenous Bolus from Bag 01/10/20 2150)    ED Course  I have reviewed the triage vital signs and the nursing notes.  Pertinent labs & imaging results that were available during my care of the patient were reviewed by me and considered in my medical decision making (see chart for details).    MDM Rules/Calculators/A&P                     Patient clinically concerning for possible pulmonary embolus or DVT in the right leg or both.  Patient's presentation time was close to the time of ultrasound leaving so Doppler study of the right leg was done.  It was negative.  Chest x-ray without any acute findings.  No signs of pneumonia.  Patient had CT angio chest done to rule out pulmonary embolus.  CT scan showed evidence of some right heart strain.  Ratio 0.9.  Discussed with the radiologist.  No signs of any infiltrates.  Did have bilateral pulmonary embolus.  They were bilateral left greater than right.  Intermediate risk.  Submassive in nature.  Patient contacted  Dr. 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  He decided they would admit her to the ICU.  I started her on heparin.  Troponin was ordered and it did come back normal.  Patient's blood pressure fine no hypotension oxygen saturations on room air at rest in the upper 90s.  Patient's heart rate was more in the 90s.  No significant tachycardia.  They eventually contacted me and wanted me to have her admitted to the ICU at [facility] under Dr. Jayme Cloud.  Peterson [place].  Those orders were placed.  Since patient going to the ICU the 50 minutes if he had Covid test was ordered.  And was negative.     Final Clinical Impression(s) / ED Diagnoses Final diagnoses:  Other acute pulmonary embolism with [facility])    Rx / DC Orders ED Discharge Orders    None       Vanetta Mulders, MD 01/10/20 2329

## 2020-01-10 NOTE — ED Notes (Signed)
Ultrasound in progress  

## 2020-01-10 NOTE — Progress Notes (Signed)
ANTICOAGULATION CONSULT NOTE - Initial Consult  Pharmacy Consult for heparin Indication: pulmonary embolus  Allergies  Allergen Reactions  . Codeine Nausea And Vomiting  . Penicillins Hives    Patient Measurements: Height: 5\' 8"  (172.7 cm) Weight: 227 lb (103 kg) IBW/kg (Calculated) : 63.9 Heparin Dosing Weight: 87kg  Vital Signs: Temp: 98.7 F (37.1 C) (02/02 1832) Temp Source: Oral (02/02 1832) BP: 140/102 (02/02 2030) Pulse Rate: 101 (02/02 2030)  Labs: Recent Labs    01/10/20 2024  HGB 13.2  HCT 40.6  PLT 159  CREATININE 0.86    Estimated Creatinine Clearance: 104.8 mL/min (by C-G formula based on SCr of 0.86 mg/dL).   Medical History: Past Medical History:  Diagnosis Date  . Allergy   . Chicken pox   . Depression   . Migraine   . Migraines    Assessment: 89 YOF presenting with SOB, PE on CT angio, not on anticoagulation PTA.  CBC wnl.  Goal of Therapy:  Heparin level 0.3-0.7 units/ml Monitor platelets by anticoagulation protocol: Yes   Plan:  Heparin 5000 units IV x1, and gtt at 1400 units/hr F/u 6 hour heparin level  58, PharmD Clinical Pharmacist Please check AMION for all [facility] Pharmacy numbers 01/10/2020 9:42 PM

## 2020-01-10 NOTE — ED Notes (Signed)
mb to BR w/o difficulty

## 2020-01-10 NOTE — H&P (Signed)
NAME:  Sherri Miller, MRN:  [id number], DOB:  1976-11-05, LOS: 0 ADMISSION DATE:  01/10/2020, CONSULTATION DATE:  01/10/20 REFERRING MD:  [facility] - Inpatient   CHIEF COMPLAINT:  Dyspnea   Brief History   Sherri Miller is a 44 y.o. 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  She was transferred from [facility] 2/2 with bilateral PE.    History of present illness   Sherri Miller is a 44 y.o. female who has a PMH including but not limited to asthma, migraines, depression.  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   She presented to [facility] with dyspnea that started on 1/30.  Associated with chronic cough and right calf pain.  Denies any fevers/chills/sweats, chest pain, N/V/D, abd pain, exposures to known sick contacts.  Has completed 2 of 2 COVID vaccines.  CTA chest demonstrated acute bilateral PE with RV / LV = 0.95.  LE duplex was negative.  She was hemodynamically stable and maintained sats in high 90's on room air.  No hx of prior VTE.  Denies any recent periods of prolonged immobilization, long trips, hemoptysis.  She is on an oral contraceptive and has BMI 34.5.  Otherwise, no known risk factors.  She was transferred to [facility] for further management.  Past Medical History  has GAD (generalized anxiety disorder); Depression; ADHD; Headache, menstrual migraine; OTHER ACUTE SINUSITIS; URI; Allergic rhinitis; HEADACHE; Lumbar degenerative disc disease; Lumbar radiculopathy; Lumbar spondylosis; and Pulmonary embolus (HCC) on their problem list.  Significant Hospital Events   2/2 > admit.  Consults:  None.  Procedures:  None.  Significant Diagnostic Tests:  CTA chest 2/2 > acute bilateral PE with RV / LV = 0.95.  Micro Data:  SARS CoV2 2/2 > neg.  Antimicrobials:  None.   Interim history/subjective:  Comfortable on room air.  Anxious.  Objective:  Blood pressure (!) 151/83, pulse (!) 105, temperature 98.7 F (37.1 C), temperature source Oral, resp. rate (!) 21, height 5\' 8"  (1.727 m),  weight 103 kg, SpO2 99 %.       No intake or output data in the 24 hours ending 01/10/20 2235 Filed Weights   01/10/20 1832  Weight: 103 kg    Examination: General: Adult female, resting in bed, in NAD. Neuro: A&O X 3, no deficits. HEENT: [place]/AT. Sclerae anicteric. EOMI. Cardiovascular: RRR, no M/R/G.  Lungs: Respirations even and unlabored.  CTA bilaterally, No W/R/R. Abdomen: BS x 4, soft, NT/ND.  Musculoskeletal: No gross deformities, no edema.  Skin: Intact, warm, no rashes.   Assessment & Plan:   Acute bilateral PE - appears unprovoked.  No risk factors besides being on OCP and BMI 34.5.  Fortunately she is tolerating things quite well and has remained hemodynamically stable on room air. - Continue heparin gtt for now. - Pt thinking more on whether she would prefer DOAC vs Warfarin (DOAC probably better suits her lifestyle and would facilitate earlier discharge). - No indication for either systemic or catheter directed lytics. - Recommend follow up with OB/GYN for discussion regarding contraceptive plan (would stop OCP).  Hx asthma. - Dulera in lieu of home breo.  Hx migraines. - Continue home gabapentin. - Hold home rizatriptan (not on formulary).  Best Practice:  Diet: Regular. Pain/Anxiety/Delirium protocol (if indicated): N/A. VAP protocol (if indicated): N/A. DVT prophylaxis: SCD's / Heparin gtt. GI prophylaxis: N/A. Glucose control: N/A. Mobility: Bedrest. Code Status: Full. Family Communication: None. Disposition: Progressive.  Labs  CBC: Recent Labs  Lab 01/10/20 2024  WBC 9.2  NEUTROABS 6.3  HGB 13.2  HCT 40.6  MCV 86.4  PLT 159   Basic Metabolic Panel: Recent Labs  Lab 01/10/20 2024  NA 137  K 3.7  CL 105  CO2 24  GLUCOSE 97  BUN 11  CREATININE 0.86  CALCIUM 9.2   GFR: Estimated Creatinine Clearance: 104.8 mL/min (by C-G formula based on SCr of 0.86 mg/dL). Recent Labs  Lab 01/10/20 2024  WBC 9.2   Liver Function Tests: No  results for input(s): AST, ALT, ALKPHOS, BILITOT, PROT, ALBUMIN in the last 168 hours. No results for input(s): LIPASE, AMYLASE in the last 168 hours. No results for input(s): AMMONIA in the last 168 hours. ABG    Component Value Date/Time   HCO3 25.4 (H) 07/21/2007 2251   TCO2 27 07/21/2007 2251    Coagulation Profile: No results for input(s): INR, PROTIME in the last 168 hours. Cardiac Enzymes: No results for input(s): CKTOTAL, CKMB, CKMBINDEX, TROPONINI in the last 168 hours. HbA1C: No results found for: HGBA1C CBG: No results for input(s): GLUCAP in the last 168 hours.  Review of Systems:   All negative; except for those that are bolded, which indicate positives.  Constitutional: weight loss, weight gain, night sweats, fevers, chills, fatigue, weakness.  HEENT: headaches, sore throat, sneezing, nasal congestion, post nasal drip, difficulty swallowing, tooth/dental problems, visual complaints, visual changes, ear aches. Neuro: difficulty with speech, weakness, numbness, ataxia. CV:  chest pain, orthopnea, PND, swelling in lower extremities, dizziness, palpitations, syncope.  Resp: cough, hemoptysis, dyspnea, wheezing. GI: heartburn, indigestion, abdominal pain, nausea, vomiting, diarrhea, constipation, change in bowel habits, loss of appetite, hematemesis, melena, hematochezia.  GU: dysuria, change in color of urine, urgency or frequency, flank pain, hematuria. MSK: joint pain or swelling, decreased range of motion, right calf pain. Psych: change in mood or affect, depression, anxiety, suicidal ideations, homicidal ideations. Skin: rash, itching, bruising.   Past medical history  She,  has a past medical history of Allergy, Chicken pox, Depression, Migraine, and Migraines.   Surgical History   History reviewed. No pertinent surgical history.   Social History   reports that she has never smoked. She has never used smokeless tobacco. She reports that she does not drink  alcohol or use drugs.   Family history   Her She was adopted. Family history is unknown by patient.   Allergies Allergies  Allergen Reactions  . Codeine Nausea And Vomiting  . Penicillins Hives     Home meds  Prior to Admission medications   Medication Sig Start Date End Date Taking? Authorizing Provider  drospirenone-ethinyl estradiol (YAZ,GIANVI,LORYNA) 3-0.02 MG tablet Take 1 tablet by mouth daily.    [provider]  famotidine (PEPCID) 20 MG tablet Take 1 tablet (20 mg total) by mouth 2 (two) times daily. 12/22/19 12/21/20  Lorin Glass, MD  fluticasone furoate-vilanterol (BREO ELLIPTA) 100-25 MCG/INH AEPB Inhale 1 puff into the lungs daily. 12/22/19   Lorin Glass, MD  Fluticasone-Salmeterol (ADVAIR DISKUS) 250-50 MCG/DOSE AEPB Inhale 1 puff into the lungs 2 (two) times daily. 12/27/19   Lorin Glass, MD  gabapentin (NEURONTIN) 600 MG tablet Take 600 mg by mouth 3 (three) times daily.    [provider]  HYDROcodone-acetaminophen (NORCO/VICODIN) 5-325 MG tablet Take by mouth. 01/25/16   [provider]  montelukast (SINGULAIR) 10 MG tablet Take 1 tablet (10 mg total) by mouth at bedtime. 12/22/19   Lorin Glass, MD  ondansetron (ZOFRAN) 8 MG tablet Take 8 mg by mouth every 8 (eight) hours as needed for nausea.     Jacques Navy, MD  rizatriptan (MAXALT-MLT) 10 MG disintegrating tablet DISSOLVE 1 TABLET BY MOUTH AS NEEDED FOR MIGRAINE, MAY REPEAT IN 2 HOURS IF NEEDED. NO MORE THAN 30MG  IN 24 HOURS 12/22/19   Nche, Charlene Brooke, NP  traZODone (DESYREL) 50 MG tablet Take 2 tablets (100 mg total) by mouth at bedtime. 10/14/19   Nche, Charlene Brooke, NP    Montey Hora, [facility] 01/11/2020, 3:29 AM

## 2020-01-10 NOTE — ED Triage Notes (Signed)
Pt c/o SOB started 1/30-reports hx chronic cough-also c/o pain to right calf started 1/31-NAD-steady gait

## 2020-01-11 ENCOUNTER — Inpatient Hospital Stay (HOSPITAL_COMMUNITY): Payer: 59

## 2020-01-11 DIAGNOSIS — I2699 Other pulmonary embolism without acute cor pulmonale: Secondary | ICD-10-CM

## 2020-01-11 DIAGNOSIS — I2602 Saddle embolus of pulmonary artery with acute cor pulmonale: Secondary | ICD-10-CM

## 2020-01-11 LAB — ECHOCARDIOGRAM COMPLETE
Height: 67.5 in
Weight: 3702.4 oz

## 2020-01-11 LAB — TROPONIN I (HIGH SENSITIVITY): Troponin I (High Sensitivity): 3 ng/L (ref <18)

## 2020-01-11 MED ORDER — APIXABAN 5 MG PO TABS: 5.0000 mg | ORAL_TABLET | Freq: Two times a day (BID) | ORAL | Status: DC

## 2020-01-11 MED ORDER — APIXABAN 5 MG PO TABS: ORAL_TABLET | ORAL | 0 refills | Status: DC

## 2020-01-11 MED ORDER — APIXABAN 5 MG PO TABS: 5.0000 mg | ORAL_TABLET | Freq: Two times a day (BID) | ORAL | 4 refills | Status: DC

## 2020-01-11 MED ORDER — ELIQUIS DVT/PE STARTER PACK 5 MG PO TBPK: ORAL_TABLET | ORAL | 0 refills | Status: DC

## 2020-01-11 MED ORDER — APIXABAN 5 MG PO TABS
10.0000 mg | ORAL_TABLET | Freq: Two times a day (BID) | ORAL | Status: DC
  Administered 2020-01-11: 09:00:00 10 mg via ORAL
  Filled 2020-01-11: qty 2

## 2020-01-11 MED FILL — ELIQUIS STARTER PACK 5 MG T: 5 | 30 days supply | Qty: 74 | Fill #0

## 2020-01-11 NOTE — Discharge Instructions (Addendum)
Eliquis 10mg  BID for 7 days then 5mg  BID for at least 3 months No more birth control pills We can discuss when and if to stop blood thinners at later time Take it easy and okay to return to work in a couple weeks if you feeling up to it  Information on my medicine - ELIQUIS (apixaban)  This medication education was reviewed with me or my healthcare representative as part of my discharge preparation.    Why was Eliquis prescribed for you? Eliquis was prescribed to treat blood clots that may have been found in the veins of your legs (deep vein thrombosis) or in your lungs (pulmonary embolism) and to reduce the risk of them occurring again.  What do You need to know about Eliquis ? The starting dose is 10 mg (two 5 mg tablets) taken TWICE daily for the FIRST SEVEN (7) DAYS, then on 01/18/2020  the dose is reduced to ONE 5 mg tablet taken TWICE daily.  Eliquis may be taken with or without food.   Try to take the dose about the same time in the morning and in the evening. If you have difficulty swallowing the tablet whole please discuss with your pharmacist how to take the medication safely.  Take Eliquis exactly as prescribed and DO NOT stop taking Eliquis without talking to the doctor who prescribed the medication.  Stopping may increase your risk of developing a new blood clot.  Refill your prescription before you run out.  After discharge, you should have regular check-up appointments with your healthcare provider that is prescribing your Eliquis.    What do you do if you miss a dose? If a dose of ELIQUIS is not taken at the scheduled time, take it as soon as possible on the same day and twice-daily administration should be resumed. The dose should not be doubled to make up for a missed dose.  Important Safety Information A possible side effect of Eliquis is bleeding. You should call your healthcare provider right away if you experience any of the following: ? Bleeding from an  injury or your nose that does not stop. ? Unusual colored urine (red or dark brown) or unusual colored stools (red or black). ? Unusual bruising for unknown reasons. ? A serious fall or if you hit your head (even if there is no bleeding).  Some medicines may interact with Eliquis and might increase your risk of bleeding or clotting while on Eliquis. To help avoid this, consult your healthcare provider or pharmacist prior to using any new prescription or non-prescription medications, including herbals, vitamins, non-steroidal anti-inflammatory drugs (NSAIDs) and supplements.  This website has more information on Eliquis (apixaban): http://www.eliquis.com/eliquis/home

## 2020-01-11 NOTE — ED Notes (Signed)
Pt did not want any of her night time medications until she was moved to a room.

## 2020-01-11 NOTE — Plan of Care (Signed)
  Problem: Clinical Measurements: Goal: Respiratory complications will improve Outcome: Progressing   Problem: Activity: Goal: Risk for activity intolerance will decrease Outcome: Progressing   Problem: Nutrition: Goal: Adequate nutrition will be maintained Outcome: Progressing   Problem: Clinical Measurements: Goal: Respiratory complications will improve Outcome: Progressing

## 2020-01-11 NOTE — Progress Notes (Signed)
Patient discharged. Discharge instructions reviewed with patient. All questions answered. IVs removed. Medication delivered to bedside.  All belongings gathered and sent with patient. Patient transported via wheelchair to main lobby.

## 2020-01-11 NOTE — Discharge Summary (Addendum)
Physician Discharge Summary       Patient ID: AUTUMM HATTERY MRN: [id number] DOB/AGE: September 20, 1976 44 y.o.  Admit date: 01/10/2020 Discharge date: 01/11/2020  Discharge Diagnoses:  Active Problems:   Pulmonary embolus (HCC)   History of Present Illness: Sherri Miller is a 44 y.o. female who has a PMH including but not limited to asthma, migraines, depression.  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   She presented to [facility] 2/2 with dyspnea that started on 1/30.  Associated with chronic cough and right calf pain.  Denies any fevers/chills/sweats, chest pain, N/V/D, abd pain, exposures to known sick contacts.  Has completed 2 of 2 COVID vaccines.  CTA chest demonstrated acute bilateral PE with RV / LV = 0.95.  LE duplex was negative.  She was hemodynamically stable and maintained sats in high 90's on room air.  No hx of prior VTE.  Denies any recent periods of prolonged immobilization, long trips, hemoptysis.  She is on an oral contraceptive and has BMI 34.5.  Otherwise, no known risk factors. She was transferred to [facility] for admission to the pulmonary service.   Hospital Course:  She remained hemodynamically stable on room air. She was treated with heparin infusion at the time of admission and was transitioned to Eliquis on 2/3. Lower extremity doppler study was negative.    Discharge Plan by active problems   Acute bilateral PE - . Likely in the setting of oral contraceptive and BMI 34.5.  Fortunately she has tolerated well and has remained hemodynamically stable on room air. - Discharge on Eliquis 10mg  BID for 7 days, then 5mg  BID - Hold oral contraceptive for now - Recommend follow up with OB/GYN for discussion regarding contraceptive plan (would stop OCP).  Hx asthma. - Resume home Breo Ellipta  Hx migraines. - Resume home gabapentin, rizatriptan   Significant Hospital tests/ studies   CTA chest 2/2 > Acute bilateral pulmonary emboli, left greater than right, with  CT evidence for right-sided heart strain (RV/LV Ratio = 0.95.) Consistent with at least submassive (intermediate risk) PE.  Echo 2/3  Lower extremity doppler 2/3   Consults   Discharge Exam: BP (!) 133/91 (BP Location: Left Arm)   Pulse 75   Temp 98.7 F (37.1 C) (Oral)   Resp 16   Ht 5' 7.5" (1.715 m)   Wt 105 kg   SpO2 97%   BMI 35.71 kg/m    Labs at discharge Lab Results  Component Value Date   CREATININE 0.86 01/10/2020   BUN 11 01/10/2020   NA 137 01/10/2020   K 3.7 01/10/2020   CL 105 01/10/2020   CO2 24 01/10/2020   Lab Results  Component Value Date   WBC 9.2 01/10/2020   HGB 13.2 01/10/2020   HCT 40.6 01/10/2020   MCV 86.4 01/10/2020   PLT 159 01/10/2020   No results found for: ALT, AST, GGT, ALKPHOS, BILITOT No results found for: INR, PROTIME  Current radiology studies CT Angio Chest PE W/Cm &/Or Wo Cm  Result Date: 01/10/2020 CLINICAL DATA:  Shortness of breath EXAM: CT ANGIOGRAPHY CHEST WITH CONTRAST TECHNIQUE: Multidetector CT imaging of the chest was performed using the standard protocol during bolus administration of intravenous contrast. Multiplanar CT image reconstructions and MIPs were obtained to evaluate the vascular anatomy. CONTRAST:  <See Chart> OMNIPAQUE IOHEXOL 350 MG/ML SOLN COMPARISON:  None. FINDINGS: Cardiovascular: Contrast injection is sufficient to demonstrate satisfactory opacification of the pulmonary arteries to the segmental  level.There are acute bilateral pulmonary emboli. On the left the distribution is lobar, segmental, and subsegmental. On the right, the distribution is lobar in the right upper lobe and segmental and subsegmental in the right middle and right lower lobes. There is CT evidence for right-sided heart strain with an RV/LV ratio measuring approximately 0.95. The heart size is normal. There is no significant pericardial effusion. There is no evidence for thoracic aortic aneurysm or dissection. Mediastinum/Nodes: --No  mediastinal or hilar lymphadenopathy. --No axillary lymphadenopathy. --No supraclavicular lymphadenopathy. --Normal thyroid gland. --The esophagus is unremarkable Lungs/Pleura: No pulmonary nodules or masses. No pleural effusion or pneumothorax. No focal airspace consolidation. No focal pleural abnormality. Upper Abdomen: No acute abnormality. Musculoskeletal: No chest wall abnormality. No acute or significant osseous findings. Review of the MIP images confirms the above findings. IMPRESSION: Acute bilateral pulmonary emboli, left greater than right, with CT evidence for right-sided heart strain (RV/LV Ratio = 0.95.) consistent with at least submassive (intermediate risk) PE. The presence of right heart strain has been associated with an increased risk of morbidity and mortality. These results were called by telephone at the time of interpretation on 01/10/2020 at 9:26 pm to provider Vanetta Mulders , who verbally acknowledged these results. Electronically Signed   By: Katherine Mantle M.D.   On: 01/10/2020 21:29   US Venous Img Lower Right (DVT Study)  Result Date: 01/10/2020 CLINICAL DATA:  Shortness of breath and calf pain EXAM: Right  LOWER EXTREMITY VENOUS DOPPLER ULTRASOUND TECHNIQUE: Gray-scale sonography with graded compression, as well as color Doppler and duplex ultrasound were performed to evaluate the lower extremity deep venous systems from the level of the common femoral vein and including the common femoral, femoral, profunda femoral, popliteal and calf veins including the posterior tibial, peroneal and gastrocnemius veins when visible. The superficial great saphenous vein was also interrogated. Spectral Doppler was utilized to evaluate flow at rest and with distal augmentation maneuvers in the common femoral, femoral and popliteal veins. COMPARISON:  None. FINDINGS: Contralateral Common Femoral Vein: Respiratory phasicity is normal and symmetric with the symptomatic side. No evidence of thrombus.  Normal compressibility. Common Femoral Vein: No evidence of thrombus. Normal compressibility, respiratory phasicity and response to augmentation. Saphenofemoral Junction: No evidence of thrombus. Normal compressibility and flow on color Doppler imaging. Profunda Femoral Vein: No evidence of thrombus. Normal compressibility and flow on color Doppler imaging. Femoral Vein: No evidence of thrombus. Normal compressibility, respiratory phasicity and response to augmentation. Popliteal Vein: No evidence of thrombus. Normal compressibility, respiratory phasicity and response to augmentation. Calf Veins: No evidence of thrombus. Normal compressibility and flow on color Doppler imaging. Superficial Great Saphenous Vein: No evidence of thrombus. Normal compressibility. Venous Reflux:  None. Other Findings:  None. IMPRESSION: No evidence of deep venous thrombosis. Electronically Signed   By: Jonna Clark M.D.   On: 01/10/2020 20:20   DG Chest Port 1 View  Result Date: 01/10/2020 CLINICAL DATA:  44 year old female with shortness of breath. EXAM: PORTABLE CHEST 1 VIEW COMPARISON:  Chest radiograph dated 03/26/2015. FINDINGS: The heart size and mediastinal contours are within normal limits. Both lungs are clear. The visualized skeletal structures are unremarkable. IMPRESSION: No active disease. Electronically Signed   By: Elgie Collard M.D.   On: 01/10/2020 18:54    Disposition:     Allergies as of 01/11/2020      Reactions   Codeine Nausea And Vomiting   Penicillins Hives      Medication List    STOP taking these  medications   drospirenone-ethinyl estradiol 3-0.02 MG tablet Commonly known as: YAZ     TAKE these medications   apixaban 5 MG Tabs tablet Commonly known as: Eliquis Take 2 tablets (10mg ) twice daily for 7 days, then 1 tablet (5mg ) twice daily   apixaban 5 MG Tabs tablet Commonly known as: Eliquis Take 1 tablet (5 mg total) by mouth 2 (two) times daily.   famotidine 20 MG  tablet Commonly known as: PEPCID Take 1 tablet (20 mg total) by mouth 2 (two) times daily.   fluticasone furoate-vilanterol 100-25 MCG/INH Aepb Commonly known as: BREO ELLIPTA Inhale 1 puff into the lungs daily.   Fluticasone-Salmeterol 250-50 MCG/DOSE Aepb Commonly known as: Advair Diskus Inhale 1 puff into the lungs 2 (two) times daily.   gabapentin 600 MG tablet Commonly known as: NEURONTIN Take 600 mg by mouth 3 (three) times daily.   HYDROcodone-acetaminophen 5-325 MG tablet Commonly known as: NORCO/VICODIN Take by mouth.   montelukast 10 MG tablet Commonly known as: SINGULAIR Take 1 tablet (10 mg total) by mouth at bedtime.   ondansetron 8 MG tablet Commonly known as: ZOFRAN Take 8 mg by mouth every 8 (eight) hours as needed for nausea.   rizatriptan 10 MG disintegrating tablet Commonly known as: MAXALT-MLT DISSOLVE 1 TABLET BY MOUTH AS NEEDED FOR MIGRAINE, MAY REPEAT IN 2 HOURS IF NEEDED. NO MORE THAN 30MG  IN 24 HOURS   traZODone 50 MG tablet Commonly known as: DESYREL Take 2 tablets (100 mg total) by mouth at bedtime.        Discharged Condition: Good  38 minutes of time have been dedicated to discharge assessment, planning and discharge instructions.   Signed:  , AGACNP-BC [facility]  See Amion for personal pager PCCM on call pager [phone]  01/11/2020 1:05 PM

## 2020-01-11 NOTE — Progress Notes (Signed)
Echocardiogram 2D Echocardiogram has been performed.  Sherri Miller Sherri Miller Sherri Miller 01/11/2020, 10:40 AM

## 2020-01-11 NOTE — Progress Notes (Signed)
Chaplain offered support to Sherri Miller.  Chaplain will follow-up as needed.

## 2020-01-11 NOTE — Progress Notes (Signed)
Left lower extremity venous duplex has been completed. Preliminary results can be found in CV Proc through chart review.   01/11/20 1:17 PM Olen Cordial RVT

## 2020-01-11 NOTE — Plan of Care (Signed)
  Problem: Activity: Goal: Risk for activity intolerance will decrease Outcome: Progressing   Problem: Coping: Goal: Level of anxiety will decrease Outcome: Progressing   

## 2020-01-11 NOTE — TOC Benefit Eligibility Note (Signed)
Transition of Care [facility]) Benefit Eligibility Note    Patient Details  Name: Sherri Miller MRN: [id number] Date of Birth: 1976/09/13   Medication/Dose: Eliquis (apixaban for PE) 5 mg bid  Covered?: Yes  Tier: 2 Drug  Prescription Coverage Preferred Pharmacy: [facility]  Spoke with Person/Company/Phone Number:: [employer]/ [phone]  Co-Pay: 92.92 for a 30 day supply retail/ 200.00 for a 90 day supply Mail Order  Prior Approval: No  Deductible: (No Deductible)       Dorena Bodo Phone Number: 01/11/2020, 3:29 PM

## 2020-01-12 ENCOUNTER — Encounter: Payer: Self-pay | Admitting: Internal Medicine

## 2020-01-12 NOTE — Progress Notes (Signed)
Wrote work note.

## 2020-01-13 ENCOUNTER — Encounter (HOSPITAL_COMMUNITY): Payer: Self-pay | Admitting: Emergency Medicine

## 2020-01-13 ENCOUNTER — Emergency Department (HOSPITAL_COMMUNITY): Payer: 59

## 2020-01-13 ENCOUNTER — Other Ambulatory Visit: Payer: Self-pay

## 2020-01-13 ENCOUNTER — Emergency Department (HOSPITAL_COMMUNITY)
Admission: EM | Admit: 2020-01-13 | Discharge: 2020-01-13 | Disposition: A | Discharge status: Home / Self Care | Payer: 59 | Attending: Emergency Medicine | Admitting: Emergency Medicine

## 2020-01-13 ENCOUNTER — Other Ambulatory Visit: Payer: Self-pay | Admitting: *Deleted

## 2020-01-13 DIAGNOSIS — R091 Pleurisy: Secondary | ICD-10-CM

## 2020-01-13 DIAGNOSIS — J80 Acute respiratory distress syndrome: Secondary | ICD-10-CM | POA: Diagnosis not present

## 2020-01-13 DIAGNOSIS — R05 Cough: Secondary | ICD-10-CM | POA: Diagnosis not present

## 2020-01-13 DIAGNOSIS — R0689 Other abnormalities of breathing: Secondary | ICD-10-CM | POA: Diagnosis not present

## 2020-01-13 DIAGNOSIS — Z86711 Personal history of pulmonary embolism: Secondary | ICD-10-CM | POA: Insufficient documentation

## 2020-01-13 DIAGNOSIS — R0902 Hypoxemia: Secondary | ICD-10-CM | POA: Diagnosis not present

## 2020-01-13 DIAGNOSIS — R0602 Shortness of breath: Secondary | ICD-10-CM | POA: Diagnosis not present

## 2020-01-13 DIAGNOSIS — R52 Pain, unspecified: Secondary | ICD-10-CM | POA: Diagnosis not present

## 2020-01-13 LAB — BASIC METABOLIC PANEL
Anion gap: 11 (ref 5–15)
BUN: 9 mg/dL (ref 6–20)
CO2: 19 mmol/L — ABNORMAL LOW (ref 22–32)
Calcium: 9.4 mg/dL (ref 8.9–10.3)
Chloride: 108 mmol/L (ref 98–111)
Creatinine, Ser: 0.98 mg/dL (ref 0.44–1.00)
GFR calc Af Amer: >60 mL/min (ref >60)
GFR calc non Af Amer: >60 mL/min (ref >60)
Glucose, Bld: 98 mg/dL (ref 70–99)
Potassium: 3.7 mmol/L (ref 3.5–5.1)
Sodium: 138 mmol/L (ref 135–145)

## 2020-01-13 LAB — CBC
HCT: 38 % (ref 36.0–46.0)
Hemoglobin: 12.6 g/dL (ref 12.0–15.0)
MCH: 28.5 pg (ref 26.0–34.0)
MCHC: 33.2 g/dL (ref 30.0–36.0)
MCV: 86 fL (ref 80.0–100.0)
Platelets: 194 10*3/uL (ref 150–400)
RBC: 4.42 MIL/uL (ref 3.87–5.11)
RDW: 12.1 % (ref 11.5–15.5)
WBC: 6.2 10*3/uL (ref 4.0–10.5)
nRBC: 0 % (ref 0.0–0.2)

## 2020-01-13 LAB — TROPONIN I (HIGH SENSITIVITY): Troponin I (High Sensitivity): <2 ng/L (ref <18)

## 2020-01-13 MED ORDER — ACETAMINOPHEN 500 MG PO TABS: 500.0000 mg | ORAL_TABLET | Freq: Three times a day (TID) | ORAL | 2 refills | Status: DC

## 2020-01-13 MED ORDER — IBUPROFEN 200 MG PO TABS: 800.0000 mg | ORAL_TABLET | Freq: Three times a day (TID) | ORAL | 2 refills | Status: AC

## 2020-01-13 MED ORDER — DIAZEPAM 5 MG PO TABS
5.0000 mg | ORAL_TABLET | Freq: Once | ORAL | Status: AC
  Administered 2020-01-13: 5 mg via ORAL
  Filled 2020-01-13: qty 1

## 2020-01-13 MED ORDER — OXYCODONE HCL 5 MG PO TABS: 5.0000 mg | ORAL_TABLET | Freq: Three times a day (TID) | ORAL | 0 refills | Status: AC | PRN

## 2020-01-13 MED ORDER — DIAZEPAM 5 MG PO TABS: 5.0000 mg | ORAL_TABLET | Freq: Three times a day (TID) | ORAL | 0 refills | Status: DC | PRN

## 2020-01-13 MED FILL — traZODone HCL 50 MG TABS: 50 | 30 days supply | Qty: 60 | Fill #2

## 2020-01-13 NOTE — Progress Notes (Signed)
Checked back in. Doing much better with dose of valium. Suspicion this is pleurisy and muscle spasm from increased WOB. Labs, CXR, troponin, EKG, bedside echo reassuring. Will check on her tomorrow, she is okay to go home with short course of standing motrin, tylenol and oxycodone for breakthrough.  Valium for extreme muscle spasm or SOB.  She knows not to drive on these medications.  She will call me if any questions or concerns.  Myrla Halsted MD PCCM

## 2020-01-13 NOTE — Progress Notes (Signed)
Here with more SOB after d/c yesterday for PE. EKG benign Initial vitals are re-assuring Bedside echo shows good biventricular function.  - CXR - Valium - BMP, CBC  Will check back in  Myrla Halsted MD

## 2020-01-13 NOTE — Patient Outreach (Signed)
[facility]) Care Management  01/13/2020  Sherri Miller 10-01-1976 [id number]   Transition of care telephone call  Referral received:01/11/20 Initial outreach:01/12/20 Insurance: [facility]   Initial unsuccessful telephone call to patient's preferred number in order to complete transition of care assessment; no answer, left HIPAA compliant voicemail message requesting return call.   Objective: Sherri Miller  was hospitalized at [facility] from 2/2-01/12/20  For Bilateral Pulmonary Embolism   Comorbidities include: Asthma, Anxiety , depression, menstrual migraine headache, lumbar degenerative disc disease.  She was discharged to home on 2/4/21without the need for home health services or DME.   Plan: This RNCM will route unsuccessful outreach letter with [facility] Care Management pamphlet and 24 hour Nurse Advice Line Magnet to [employer] Care Management clinical pool to be mailed to patient's home address. This RNCM will attempt another outreach within 4 business days.  Prior to completing this note, per Epic noted  patient with Sherri Miller ED visit. Will follow progress for discharge disposition for transition of care follow up.    Egbert Garibaldi, RN, BSN  [facility] Care Management,Care Management Coordinator  [phone]- Mobile [phone]- Toll Free Main Office

## 2020-01-13 NOTE — ED Provider Notes (Signed)
MSE was initiated and I personally evaluated the patient and placed orders (if any) at  11:27 AM on January 13, 2020.  Pt was admitted from 2/2-2/3 for bilateral PE.  She became more SOB today and called EMS.  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 Tamala Julian from critical care.  He has seen her primarily and will take over pt's care.  The patient appears stable so that the remainder of the MSE may be completed by another provider.   Isla Pence, MD 01/13/20 [phone]

## 2020-01-13 NOTE — ED Triage Notes (Signed)
Pt in w/incr sob past 24 hrs, dx of B PE's. Hospitalized 1/30-2/3, is on Eliquis at home. States she became very sob last night, and worsened this am. Pt arrived by [facility], critical care to room on arrival. sats 100% on RA, denies any cp

## 2020-01-13 NOTE — ED Notes (Signed)
Patient verbalizes understanding of discharge instructions. Opportunity for questioning and answers were provided. Armband removed by staff, pt discharged from ED. Ambulated out to lobby  

## 2020-01-16 ENCOUNTER — Other Ambulatory Visit: Payer: Self-pay | Admitting: Nurse Practitioner

## 2020-01-16 DIAGNOSIS — G43019 Migraine without aura, intractable, without status migrainosus: Secondary | ICD-10-CM

## 2020-01-17 ENCOUNTER — Other Ambulatory Visit (HOSPITAL_COMMUNITY): Payer: Self-pay | Admitting: Unknown Physician Specialty

## 2020-01-17 ENCOUNTER — Other Ambulatory Visit: Payer: Self-pay

## 2020-01-17 ENCOUNTER — Other Ambulatory Visit: Payer: Self-pay | Admitting: *Deleted

## 2020-01-17 MED FILL — RIZATRIPTAN BENZOATE 10 MG: 10 | 30 days supply | Qty: 12 | Fill #0

## 2020-01-17 NOTE — Patient Outreach (Signed)
[facility]) Care Management  01/17/2020  Sherri Miller August 21, 1976 [id number]   Transition of care call Referral received: 01/11/20 Initial outreach attempt: 01/12/20 Insurance: UMR   2nd unsuccessful telephone call to patient's preferred contact number in order to complete post hospital discharge transition of care assessment , no answer mailbox is full unable to leave a message.    Objective:  [facility]  For Bilateral Pulmonary Embolism   Comorbidities include: Asthma, Anxiety , depression, menstrual migraine headache, lumbar degenerative disc disease.  She was discharged to home on 2/4/21without the need for home health servicesor DME Noted patient with ED visit for shortness of breath 01/13/20.    Plan If no return call from patient will attempt 3rd outreach in the next 4 business days.   Egbert Garibaldi, RN, BSN  [facility] Care Management,Care Management Coordinator  [phone]- Mobile [phone]- Toll Free Main Office

## 2020-01-18 ENCOUNTER — Ambulatory Visit: Payer: Self-pay | Admitting: *Deleted

## 2020-01-18 ENCOUNTER — Inpatient Hospital Stay: Payer: 59 | Admitting: Nurse Practitioner

## 2020-01-20 ENCOUNTER — Other Ambulatory Visit: Payer: Self-pay | Admitting: *Deleted

## 2020-01-20 NOTE — Patient Outreach (Signed)
Sherri Miller) Care Management  01/20/2020  Sherri Miller July 01, 1976 [id number]  Transition of care call Referral received: 01/11/20 Initial outreach attempt:01/13/20 Insurance: UMR   Third unsuccessful telephone call to patient's preferred contact number in order to complete post hospital discharge transition of care assessment; no answer, left HIPAA compliant message requesting return call.   Objective: [facility]For Bilateral Pulmonary EmbolismComorbidities include: Asthma, Anxiety , depression, menstrual migraine headache, lumbar degenerative disc disease.  Shewas discharged to home on 2/4/21without the need for home health servicesor DME Noted patient with ED visit for shortness of breath 01/13/20.   Plan: If no return call from patient, will close case to [facility] services in 10 business days after initial post hospital discharge outreach,on 2/5 /21.    Egbert Garibaldi, RN, BSN  [facility] Care Management,Care Management Coordinator  [phone]- Mobile [phone]- Toll Free Main Office

## 2020-01-26 ENCOUNTER — Other Ambulatory Visit: Payer: Self-pay | Admitting: *Deleted

## 2020-01-26 NOTE — Patient Outreach (Signed)
[facility]) Care Management  01/26/2020  SETH FRIEDLANDER [birth year] [id number]   Transition of care /Case Closure Unsuccessful outreach    Referral received: 01/11/20 Initial outreach:01/13/20 Insurance: [place] UMR   Unable to complete post hospital discharge transition of care assessment. No return call form patient after 3 call attempts and no response to request to contact RN Care Coordinator in unsuccessful outreach letter mailed to home on 2/5 /21.  Objective: [facility]For Bilateral Pulmonary EmbolismComorbidities include: Asthma, Anxiety , depression, menstrual migraine headache, lumbar degenerative disc disease.  Shewas discharged to home on 01/12/20 without the need for home health servicesor DME Noted patient with ED visit for shortness of breath 01/13/20.  Plan Case closed to [facility] as it has been 10 days since initial post discharge outreach attempt.   Egbert Garibaldi, RN, BSN  [facility] Care Management,Care Management Coordinator  [phone]- Mobile [phone]- Toll Free Main Office

## 2020-02-03 MED FILL — FAMOTIDINE 20 MG TABLET: 20 | 30 days supply | Qty: 60 | Fill #1

## 2020-02-03 MED FILL — MONTELUKAST SOD 10 MG TAB: 10 | 30 days supply | Qty: 30 | Fill #1

## 2020-02-03 MED FILL — ELIQUIS 5 MG TABLET: 5 | 30 days supply | Qty: 60 | Fill #0

## 2020-02-08 ENCOUNTER — Telehealth: Payer: Self-pay | Admitting: Nurse Practitioner

## 2020-02-08 DIAGNOSIS — F411 Generalized anxiety disorder: Secondary | ICD-10-CM

## 2020-02-08 NOTE — Telephone Encounter (Signed)
Sent 90days of trazodone, no refill She still needs to schedule hospital f/up and CPE with me

## 2020-02-08 NOTE — Telephone Encounter (Signed)
Pt said she had blood work done while she was in hospital and wanted to know if that would be good enough for her to get her medications refilled.

## 2020-02-08 NOTE — Telephone Encounter (Signed)
LVM for the pt to call back, need more information.  

## 2020-02-09 MED ORDER — TRAZODONE HCL 100 MG PO TABS: 100.0000 mg | ORAL_TABLET | Freq: Every day | ORAL | 0 refills | Status: DC

## 2020-02-09 MED FILL — traZODone HCL 100 MG TABS: 100 | 90 days supply | Qty: 90 | Fill #0

## 2020-02-09 NOTE — Telephone Encounter (Signed)
Sherri Miller please check to make sure Trazodone sent in to [facility] (I do not see it sent on my end)?   Pt is aware to make a CPE with you. Pt stated she going to follow up with pulmonary doctor for [facility] f/u Pt schedule to see OBGYN for yearly this month.

## 2020-02-09 NOTE — Telephone Encounter (Signed)
Refill sent.

## 2020-02-09 NOTE — Telephone Encounter (Signed)
LVM for the pt to call back.

## 2020-02-15 ENCOUNTER — Telehealth: Payer: Self-pay | Admitting: Nurse Practitioner

## 2020-02-15 NOTE — Telephone Encounter (Signed)
Pt is aware to call and schedule an appt for CPE with N[facility]. I accidentally LVM for her to call back. But no, you do not have to call her again.

## 2020-02-15 NOTE — Telephone Encounter (Signed)
Patient cancelled Hospital followup appt on 01/18/20. Should I reach out to her and reschedule that?

## 2020-02-26 ENCOUNTER — Telehealth: Payer: Self-pay | Admitting: Critical Care Medicine

## 2020-02-26 MED ORDER — FUROSEMIDE 20 MG PO TABS: 20.0000 mg | ORAL_TABLET | Freq: Every day | ORAL | 1 refills | Status: DC | PRN

## 2020-02-26 NOTE — Telephone Encounter (Signed)
Contacted by the patient, complaining of swelling and weight gain. Has a history of PE. 4# weight increase in the past few days.  Prescribing lasix 20mg  daily PRN.  , DO 02/26/20 10:39 AM [facility]

## 2020-02-27 MED FILL — FUROSEMIDE 20 MG TABS: 20 | 60 days supply | Qty: 60 | Fill #0

## 2020-03-02 NOTE — Telephone Encounter (Signed)
Thank you :)

## 2020-03-06 MED FILL — MONTELUKAST SOD 10 MG TAB: 10 | 30 days supply | Qty: 30 | Fill #2

## 2020-03-06 MED FILL — ELIQUIS 5 MG TABLET: 5 | 30 days supply | Qty: 60 | Fill #1

## 2020-03-19 MED FILL — RIZATRIPTAN BENZOATE 10 MG: 10 | 30 days supply | Qty: 12 | Fill #1

## 2020-03-19 MED FILL — FAMOTIDINE 20 MG TABLET: 20 | 30 days supply | Qty: 60 | Fill #2

## 2020-03-20 ENCOUNTER — Telehealth: Payer: Self-pay | Admitting: Internal Medicine

## 2020-03-20 DIAGNOSIS — I2609 Other pulmonary embolism with acute cor pulmonale: Secondary | ICD-10-CM

## 2020-03-20 NOTE — Telephone Encounter (Signed)
Now euvolemic, check echo.

## 2020-04-05 ENCOUNTER — Other Ambulatory Visit: Payer: Self-pay

## 2020-04-05 ENCOUNTER — Ambulatory Visit (HOSPITAL_COMMUNITY): Payer: 59 | Attending: Internal Medicine

## 2020-04-05 DIAGNOSIS — I2609 Other pulmonary embolism with acute cor pulmonale: Secondary | ICD-10-CM | POA: Insufficient documentation

## 2020-04-09 MED FILL — ELIQUIS 5 MG TABLET: 5 | 30 days supply | Qty: 60 | Fill #2

## 2020-04-09 MED FILL — MONTELUKAST SOD 10 MG TAB: 10 | 30 days supply | Qty: 30 | Fill #3

## 2020-04-18 ENCOUNTER — Other Ambulatory Visit: Payer: Self-pay | Admitting: Internal Medicine

## 2020-04-18 ENCOUNTER — Telehealth: Payer: Self-pay | Admitting: Internal Medicine

## 2020-04-18 MED ORDER — APIXABAN 5 MG PO TABS: 5.0000 mg | ORAL_TABLET | Freq: Two times a day (BID) | ORAL | 3 refills | Status: DC

## 2020-04-23 MED FILL — FAMOTIDINE 20 MG TABLET: 20 | 30 days supply | Qty: 60 | Fill #3

## 2020-04-23 MED FILL — RIZATRIPTAN BENZOATE 10 MG: 10 | 30 days supply | Qty: 12 | Fill #2

## 2020-05-04 ENCOUNTER — Telehealth: Payer: 59 | Admitting: Family

## 2020-05-04 DIAGNOSIS — B80 Enterobiasis: Secondary | ICD-10-CM | POA: Diagnosis not present

## 2020-05-04 MED ORDER — ALBENDAZOLE 200 MG PO TABS: 400.0000 mg | ORAL_TABLET | Freq: Once | ORAL | 1 refills | Status: AC

## 2020-05-04 NOTE — Progress Notes (Signed)
E Visit for Rash  We are sorry that you are not feeling well. Here is how we plan to help!  It looks like you have Pinworms. I have sent in albendazole 400 mg that you will take once on an empty stomach. Repeat in 2 weeks.    HOME CARE:   Take cool showers and avoid direct sunlight.  Apply cool compress or wet dressings.  Take a bath in an oatmeal bath.  Sprinkle content of one Aveeno packet under running faucet with comfortably warm water.  Bathe for 15-20 minutes, 1-2 times daily.  Pat dry with a towel. Do not rub the rash.  Use hydrocortisone cream.  Take an antihistamine like Benadryl for widespread rashes that itch.  The adult dose of Benadryl is 25-50 mg by mouth 4 times daily.  Caution:  This type of medication may cause sleepiness.  Do not drink alcohol, drive, or operate dangerous machinery while taking antihistamines.  Do not take these medications if you have prostate enlargement.  Read package instructions thoroughly on all medications that you take.  GET HELP RIGHT AWAY IF:   Symptoms don't go away after treatment.  Severe itching that persists.  If you rash spreads or swells.  If you rash begins to smell.  If it blisters and opens or develops a yellow-brown crust.  You develop a fever.  You have a sore throat.  You become short of breath.  MAKE SURE YOU:  Understand these instructions. Will watch your condition. Will get help right away if you are not doing well or get worse.  Thank you for choosing an e-visit. Your e-visit answers were reviewed by a board certified advanced clinical practitioner to complete your personal care plan. Depending upon the condition, your plan could have included both over the counter or prescription medications. Please review your pharmacy choice. Be sure that the pharmacy you have chosen is open so that you can pick up your prescription now.  If there is a problem you may message your provider in MyChart to have the  prescription routed to another pharmacy. Your safety is important to [place]. If you have drug allergies check your prescription carefully.  For the next 24 hours, you can use MyChart to ask questions about today's visit, request a non-urgent call back, or ask for a work or school excuse from your e-visit provider. You will get an email in the next two days asking about your experience. I hope that your e-visit has been valuable and will speed your recovery.   Approximately 5 minutes was spent documenting and reviewing patient's chart.

## 2020-05-09 MED FILL — ELIQUIS 5 MG TABLET: 5 | 90 days supply | Qty: 180 | Fill #0

## 2020-05-09 MED FILL — MONTELUKAST SOD 10 MG TAB: 10 | 30 days supply | Qty: 30 | Fill #4

## 2020-05-14 ENCOUNTER — Encounter: Payer: Self-pay | Admitting: Nurse Practitioner

## 2020-05-14 DIAGNOSIS — H00025 Hordeolum internum left lower eyelid: Secondary | ICD-10-CM | POA: Diagnosis not present

## 2020-05-14 DIAGNOSIS — F411 Generalized anxiety disorder: Secondary | ICD-10-CM

## 2020-05-15 MED ORDER — TRAZODONE HCL 100 MG PO TABS: 100.0000 mg | ORAL_TABLET | Freq: Every day | ORAL | 0 refills | Status: DC

## 2020-05-15 NOTE — Telephone Encounter (Signed)
Pt called in wanting to know why it is taking over 2 weeks for her to get an appointment with a provider at this office. Pt did not want to hear that we have a protocol about transferring from one provider to another within the [place] office.  Information was given to Tanya to assist the pt with her concerns.

## 2020-05-30 ENCOUNTER — Telehealth: Payer: Self-pay | Admitting: Internal Medicine

## 2020-05-30 MED ORDER — FAMOTIDINE 20 MG PO TABS: 20.0000 mg | ORAL_TABLET | Freq: Two times a day (BID) | ORAL | 3 refills | Status: DC

## 2020-05-31 NOTE — Telephone Encounter (Signed)
meds

## 2020-05-31 NOTE — Telephone Encounter (Signed)
refill 

## 2020-06-12 MED FILL — RIZATRIPTAN BENZOATE 10 MG: 10 | 30 days supply | Qty: 12 | Fill #3

## 2020-06-12 MED FILL — FAMOTIDINE 20 MG TABLET: 20 | 90 days supply | Qty: 180 | Fill #4

## 2020-06-12 MED FILL — MONTELUKAST SOD 10 MG TAB: 10 | 90 days supply | Qty: 90 | Fill #5

## 2020-06-14 DIAGNOSIS — F331 Major depressive disorder, recurrent, moderate: Secondary | ICD-10-CM | POA: Diagnosis not present

## 2020-06-14 DIAGNOSIS — F439 Reaction to severe stress, unspecified: Secondary | ICD-10-CM | POA: Diagnosis not present

## 2020-07-18 ENCOUNTER — Encounter (HOSPITAL_COMMUNITY): Payer: Self-pay | Admitting: Psychiatry

## 2020-07-18 ENCOUNTER — Other Ambulatory Visit: Payer: Self-pay

## 2020-07-18 ENCOUNTER — Telehealth (INDEPENDENT_AMBULATORY_CARE_PROVIDER_SITE_OTHER): Payer: 59 | Admitting: Psychiatry

## 2020-07-18 DIAGNOSIS — F410 Panic disorder [episodic paroxysmal anxiety] without agoraphobia: Secondary | ICD-10-CM | POA: Diagnosis not present

## 2020-07-18 DIAGNOSIS — F900 Attention-deficit hyperactivity disorder, predominantly inattentive type: Secondary | ICD-10-CM | POA: Diagnosis not present

## 2020-07-18 DIAGNOSIS — F411 Generalized anxiety disorder: Secondary | ICD-10-CM

## 2020-07-18 DIAGNOSIS — F331 Major depressive disorder, recurrent, moderate: Secondary | ICD-10-CM

## 2020-07-18 DIAGNOSIS — F3341 Major depressive disorder, recurrent, in partial remission: Secondary | ICD-10-CM | POA: Insufficient documentation

## 2020-07-18 DIAGNOSIS — F330 Major depressive disorder, recurrent, mild: Secondary | ICD-10-CM | POA: Insufficient documentation

## 2020-07-18 MED ORDER — SERTRALINE HCL 50 MG PO TABS: ORAL_TABLET | ORAL | 0 refills | Status: DC

## 2020-07-18 MED ORDER — CLONAZEPAM 0.5 MG PO TABS: 0.5000 mg | ORAL_TABLET | Freq: Two times a day (BID) | ORAL | 0 refills | Status: DC | PRN

## 2020-07-18 MED ORDER — TRAZODONE HCL 150 MG PO TABS: 150.0000 mg | ORAL_TABLET | Freq: Every day | ORAL | 0 refills | Status: DC

## 2020-07-18 MED FILL — clonazePAM 0.5 MG TABS: 0.5 | 15 days supply | Qty: 30 | Fill #0

## 2020-07-18 MED FILL — SERTRALINE HCL 50 MG TABLET: 50 | 36 days supply | Qty: 33 | Fill #0
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
## 2020-08-06 ENCOUNTER — Encounter: Payer: Self-pay | Admitting: Internal Medicine

## 2020-08-06 NOTE — Progress Notes (Signed)
August 06, 2020    [name] [address] [place] [address]   To whom it may concern:   The patient above is cleared to take hormonal therapy provided she continues to take full dose eliquis.  Please reach out to my office with any questions or concerns.      Myrla Halsted MD [facility]

## 2020-08-07 DIAGNOSIS — Z01419 Encounter for gynecological examination (general) (routine) without abnormal findings: Secondary | ICD-10-CM | POA: Diagnosis not present

## 2020-08-07 MED FILL — traZODone HCL 150 MG TABS: 150 | 90 days supply | Qty: 90 | Fill #0

## 2020-08-07 MED FILL — ELIQUIS 5 MG TABLET: 5 | 90 days supply | Qty: 180 | Fill #1

## 2020-08-08 ENCOUNTER — Other Ambulatory Visit (HOSPITAL_COMMUNITY): Payer: Self-pay | Admitting: Obstetrics and Gynecology

## 2020-08-08 MED FILL — JASMIEL 3-0.02 MG TABS: 3-0.02 | 72 days supply | Qty: 84 | Fill #0

## 2020-08-22 ENCOUNTER — Telehealth (INDEPENDENT_AMBULATORY_CARE_PROVIDER_SITE_OTHER): Payer: 59 | Admitting: Psychiatry

## 2020-08-22 ENCOUNTER — Other Ambulatory Visit: Payer: Self-pay

## 2020-08-22 DIAGNOSIS — F331 Major depressive disorder, recurrent, moderate: Secondary | ICD-10-CM | POA: Diagnosis not present

## 2020-08-22 DIAGNOSIS — F411 Generalized anxiety disorder: Secondary | ICD-10-CM | POA: Diagnosis not present

## 2020-08-22 DIAGNOSIS — F900 Attention-deficit hyperactivity disorder, predominantly inattentive type: Secondary | ICD-10-CM

## 2020-08-22 DIAGNOSIS — F410 Panic disorder [episodic paroxysmal anxiety] without agoraphobia: Secondary | ICD-10-CM | POA: Diagnosis not present

## 2020-08-22 MED ORDER — DULOXETINE HCL 30 MG PO CPEP: ORAL_CAPSULE | ORAL | 0 refills | Status: DC

## 2020-08-22 MED ORDER — CLONAZEPAM 0.5 MG PO TABS: 0.5000 mg | ORAL_TABLET | Freq: Two times a day (BID) | ORAL | 0 refills | Status: DC | PRN

## 2020-08-22 MED FILL — clonazePAM 0.5 MG TABS: 0.5 | 15 days supply | Qty: 30 | Fill #0

## 2020-08-22 MED FILL — DULoxetine HCL 30 MG CPEP: 30 | 44 days supply | Qty: 74 | Fill #0
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
## 2020-08-29 ENCOUNTER — Telehealth: Payer: Self-pay | Admitting: Internal Medicine

## 2020-08-29 DIAGNOSIS — R001 Bradycardia, unspecified: Secondary | ICD-10-CM

## 2020-08-29 DIAGNOSIS — R0602 Shortness of breath: Secondary | ICD-10-CM

## 2020-08-29 DIAGNOSIS — R002 Palpitations: Secondary | ICD-10-CM

## 2020-08-29 NOTE — Telephone Encounter (Signed)
Variable heart-rates on home monitor with symptomatic palpitations, dizziness, SOB.  Warrants evaluation by cardiology, consideration for event monitoring.  Myrla Halsted MD PCCM

## 2020-09-07 ENCOUNTER — Encounter: Payer: Self-pay | Admitting: General Practice

## 2020-09-13 ENCOUNTER — Encounter (HOSPITAL_COMMUNITY): Payer: Self-pay | Admitting: Cardiology

## 2020-09-13 ENCOUNTER — Other Ambulatory Visit: Payer: Self-pay

## 2020-09-13 ENCOUNTER — Ambulatory Visit (HOSPITAL_COMMUNITY)
Admission: RE | Admit: 2020-09-13 | Discharge: 2020-09-13 | Disposition: A | Discharge status: Home / Self Care | Payer: 59 | Source: Ambulatory Visit | Attending: Cardiology | Admitting: Cardiology

## 2020-09-13 VITALS — BP 134/82 | HR 71 | Ht 68.0 in | Wt 242.4 lb

## 2020-09-13 DIAGNOSIS — Z79899 Other long term (current) drug therapy: Secondary | ICD-10-CM | POA: Insufficient documentation

## 2020-09-13 DIAGNOSIS — Z7901 Long term (current) use of anticoagulants: Secondary | ICD-10-CM | POA: Insufficient documentation

## 2020-09-13 DIAGNOSIS — Z86718 Personal history of other venous thrombosis and embolism: Secondary | ICD-10-CM | POA: Diagnosis not present

## 2020-09-13 DIAGNOSIS — Z86711 Personal history of pulmonary embolism: Secondary | ICD-10-CM | POA: Diagnosis not present

## 2020-09-13 DIAGNOSIS — R0602 Shortness of breath: Secondary | ICD-10-CM | POA: Diagnosis not present

## 2020-09-13 DIAGNOSIS — R0600 Dyspnea, unspecified: Secondary | ICD-10-CM | POA: Diagnosis not present

## 2020-09-13 DIAGNOSIS — J45909 Unspecified asthma, uncomplicated: Secondary | ICD-10-CM | POA: Insufficient documentation

## 2020-09-13 DIAGNOSIS — R002 Palpitations: Secondary | ICD-10-CM | POA: Insufficient documentation

## 2020-09-13 DIAGNOSIS — F329 Major depressive disorder, single episode, unspecified: Secondary | ICD-10-CM | POA: Diagnosis not present

## 2020-09-13 DIAGNOSIS — I5022 Chronic systolic (congestive) heart failure: Secondary | ICD-10-CM

## 2020-09-13 LAB — BRAIN NATRIURETIC PEPTIDE: B Natriuretic Peptide: 40.4 pg/mL (ref 0.0–100.0)

## 2020-09-13 NOTE — Patient Instructions (Addendum)
Labs done today, your results will be available in MyChart, we will contact you for abnormal readings.  Your physician has recommended that you have a cardiopulmonary stress test (CPX). CPX testing is a non-invasive measurement of heart and lung function. It replaces a traditional treadmill stress test. This type of test provides a tremendous amount of information that relates not only to your present condition but also for future outcomes. This test combines measurements of you ventilation, respiratory gas exchange in the lungs, electrocardiogram (EKG), blood pressure and physical response before, during, and following an exercise protocol.  Please return next week to have Zio patch administered.  Your provider has recommended that  you wear a Zio Patch for 3 days.  This monitor will record your heart rhythm for our review.  IF you have any symptoms while wearing the monitor please press the button.  If you have any issues with the patch or you notice a red or orange light on it please call the company at [phone].  Once you remove the patch please mail it back to the company as soon as possible so we can get the results.   If you have any questions or concerns before your next appointment please send [place] a message through [place] or call our office at [phone].    TO LEAVE A MESSAGE FOR THE NURSE SELECT OPTION 2, PLEASE LEAVE A MESSAGE INCLUDING:  YOUR NAME  DATE OF BIRTH  CALL BACK NUMBER  REASON FOR CALL**this is important as we prioritize the call backs  YOU WILL RECEIVE A CALL BACK THE SAME DAY AS LONG AS YOU CALL BEFORE 4:00 PM

## 2020-09-15 NOTE — Progress Notes (Signed)
PCP: Anne Ng, NP Cardiology: Dr. Shirlee Latch  44 y.o. with history of asthma, migraines and PE in 2/21 presents for evaluation of dyspnea.  Patient is adopted and does not know her family history.  She was in her usual state of health until 2/21, when she developed dyspnea and chest pain.  She went to the ER, CTA chest showed bilateral PEs and she was started on Eliquis.  She had not had any prolonged sedentary time or long car trip/plan ride.  She does not smoke.  She had been on OCPs but stopped them several months prior to her event.  Echo was done in 4/21, this showed normal LV systolic function, EF 60-65% with normal RV size and systolic function and PA systolic pressure 17 mmHg .  Initially after her PE, she gradually improved in terms of dyspnea and exercise tolerance.  However, she feels like she has worsened for the last couple of months.  Currently, she gets short of breath walking around the block or trying to ride her [employer].  She will get short of breath at rest at times.  She denies wheezing and does not think that the dyspnea is related to asthma.  She has been on Klonopin for anxiety, but his has not helped the dyspnea either. Of note, she has gained about 40 lbs since 2/21.  She feels like she has excessive heart rate excursion, HR will jump up to the 130s with minimal exertion.  Pulse is regular.  No lightheadedness or syncope.  She denies chest pain.   ECG (personally reviewed): NSR, low voltage, iRBBB  Labs (2/21): creatinine 0.98  PMH: 1. Asthma 2. Migraines 3. Depression 4. Pulmonary embolus: 2/21 bilateral PE, prior OCP use but had been off OCPs for several mos prior to event.  - Echo (4/21): EF 60-65%, normal RV, PASP 17 mmHg.   Social History   Socioeconomic History  . Marital status: Single    Spouse name: Not on file  . Number of children: Not on file  . Years of education: Not on file  . Highest education level: Not on file  Occupational History  .  Occupation: Nurse  Tobacco Use  . Smoking status: Never Smoker  . Smokeless tobacco: Never Used  Vaping Use  . Vaping Use: Never used  Substance and Sexual Activity  . Alcohol use: No    Alcohol/week: 0.0 standard drinks  . Drug use: No  . Sexual activity: Yes    Birth control/protection: Pill  Other Topics Concern  . Not on file  Social History Narrative   Works as a Copy.   Social Determinants of Health   Financial Resource Strain:   . Difficulty of Paying Living Expenses: Not on file  Food Insecurity:   . Worried About Programme researcher, broadcasting/film/video in the Last Year: Not on file  . Ran Out of Food in the Last Year: Not on file  Transportation Needs:   . Lack of Transportation (Medical): Not on file  . Lack of Transportation (Non-Medical): Not on file  Physical Activity:   . Days of Exercise per Week: Not on file  . Minutes of Exercise per Session: Not on file  Stress:   . Feeling of Stress : Not on file  Social Connections:   . Frequency of Communication with Friends and Family: Not on file  . Frequency of Social Gatherings with Friends and Family: Not on file  . Attends Religious Services: Not on file  .  Active Member of Clubs or Organizations: Not on file  . Attends Banker Meetings: Not on file  . Marital Status: Not on file  Intimate Partner Violence:   . Fear of Current or Ex-Partner: Not on file  . Emotionally Abused: Not on file  . Physically Abused: Not on file  . Sexually Abused: Not on file   FH: Adopted, unknown.   ROS: All systems reviewed and negative except as per HPI.   Current Outpatient Medications  Medication Sig Dispense Refill  . acetaminophen (TYLENOL) 500 MG tablet Take 1 tablet (500 mg total) by mouth 3 (three) times daily. 100 tablet 2  . apixaban (ELIQUIS) 5 MG TABS tablet Take 1 tablet (5 mg total) by mouth 2 (two) times daily. 180 tablet 3  . clonazePAM (KLONOPIN) 0.5 MG tablet Take 1 tablet (0.5 mg total) by mouth 2  (two) times daily as needed for anxiety. 30 tablet 0  . DULoxetine (CYMBALTA) 30 MG capsule Take 1 capsule (30 mg total) by mouth daily for 14 days, THEN 2 capsules (60 mg total) daily. 74 capsule 0  . famotidine (PEPCID) 20 MG tablet Take 1 tablet (20 mg total) by mouth 2 (two) times daily. 180 tablet 3  . fluticasone furoate-vilanterol (BREO ELLIPTA) 100-25 MCG/INH AEPB Inhale 1 puff into the lungs daily. 30 each 11  . furosemide (LASIX) 20 MG tablet Take 1 tablet (20 mg total) by mouth daily as needed for edema. 30 tablet 1  . ibuprofen (MOTRIN IB) 200 MG tablet Take 4 tablets (800 mg total) by mouth 3 (three) times daily before meals. 100 tablet 2  . montelukast (SINGULAIR) 10 MG tablet Take 1 tablet (10 mg total) by mouth at bedtime. 30 tablet 11  . rizatriptan (MAXALT-MLT) 10 MG disintegrating tablet DISSOLVE 1 TABLET BY MOUTH AS NEEDED FOR MIGRAINE, MAY REPEAT IN 2 HOURS IF NEEDED. NO MORE THAN 30MG  IN 24 HOURS (Patient taking differently: Take 10 mg by mouth daily as needed for migraine. ) 10 tablet 0  . traZODone (DESYREL) 150 MG tablet Take 1 tablet (150 mg total) by mouth at bedtime. Needs Office visit for additional refills 90 tablet 0  . Fluticasone-Salmeterol (ADVAIR DISKUS) 250-50 MCG/DOSE AEPB Inhale 1 puff into the lungs 2 (two) times daily. (Patient not taking: Reported on 01/11/2020) 60 each 11   No current facility-administered medications for this encounter.   BP 134/82 (BP Location: Left Arm, Patient Position: Sitting, Cuff Size: Normal)   Pulse 71   Ht 5\' 8"  (1.727 m)   Wt 110 kg (242 lb 6.4 oz)   SpO2 98%   BMI 36.86 kg/m  General: NAD Neck: No JVD, no thyromegaly or thyroid nodule.  Lungs: Clear to auscultation bilaterally with normal respiratory effort. CV: Nondisplaced PMI.  Heart regular S1/S2, no S3/S4, no murmur.  No peripheral edema.  No carotid bruit.  Normal pedal pulses.  Abdomen: Soft, nontender, no hepatosplenomegaly, no distention.  Skin: Intact without  lesions or rashes.  Neurologic: Alert and oriented x 3.  Psych: Normal affect. Extremities: No clubbing or cyanosis.  HEENT: Normal.   Assessment/Plan: 1. Venous thromboembolism: Bilateral PE in 2/21, appears untriggered (does not know family history, no smoking, was on OCPs but quit them several months prior to event, no surgery or long trip).   - Would favor long-term apixaban as the event was significant and untriggered.  2. Exertional dyspnea: Initially improved post-PE then worsened. She describes NYHA class III symptoms (gets short of breath walking  around the block).  Echo in 4/21 showed normal LV and RV, estimate PA systolic pressure was normal.  On exam, she is not volume overloaded.  With normal echo, chronic thromboembolic disease seems unlikely.  She has, of note, gained 40 lbs since event so certainly could be due to weight gain + deconditioning.  - I would like her to get a CPX to more closely assess her dyspnea.  Based on CPX results, may consider V/Q scan to rule out chronic PE.  - Check BNP.  - Continue regular exercise and efforts at weight loss.  3. Palpitations: Heart races at times.  Seems like excess HR excursion with exercise that could be due to deconditioning.   - I will get 3 day Zio patch to assess rhythm.   Marca Ancona 09/15/2020

## 2020-09-15 NOTE — Addendum Note (Signed)
Encounter addended by: Laurey Morale, MD on: 09/15/2020 10:00 PM  Actions taken: Clinical Note Signed, Level of Service modified

## 2020-09-17 MED FILL — RIZATRIPTAN BENZOATE 10 MG: 10 | 30 days supply | Qty: 12 | Fill #4

## 2020-09-17 MED FILL — FAMOTIDINE 20 MG TABLET: 20 | 90 days supply | Qty: 180 | Fill #5

## 2020-09-17 MED FILL — MONTELUKAST SOD 10 MG TAB: 10 | 90 days supply | Qty: 90 | Fill #6

## 2020-09-19 ENCOUNTER — Other Ambulatory Visit: Payer: Self-pay

## 2020-09-19 ENCOUNTER — Ambulatory Visit (HOSPITAL_COMMUNITY)
Admission: RE | Admit: 2020-09-19 | Discharge: 2020-09-19 | Disposition: A | Discharge status: Home / Self Care | Payer: 59 | Source: Ambulatory Visit | Attending: Cardiology | Admitting: Cardiology

## 2020-09-19 DIAGNOSIS — R0602 Shortness of breath: Secondary | ICD-10-CM

## 2020-09-19 NOTE — Progress Notes (Signed)
Zio patch placed onto patient.  All instructions and information reviewed with patient, they verbalize understanding with no questions. 

## 2020-10-02 ENCOUNTER — Telehealth: Payer: Self-pay | Admitting: Internal Medicine

## 2020-10-02 MED ORDER — BENZONATATE 100 MG PO CAPS: 100.0000 mg | ORAL_CAPSULE | Freq: Four times a day (QID) | ORAL | 1 refills | Status: DC | PRN

## 2020-10-02 MED ORDER — HYDROCOD POLST-CPM POLST ER 10-8 MG/5ML PO SUER: 5.0000 mL | Freq: Every evening | ORAL | 0 refills | Status: DC | PRN

## 2020-10-02 NOTE — Telephone Encounter (Signed)
Calling for cough  Trial of tessalon perles.  Consider tussionex if remains an issue but my imprivata E-prescribe is not working right now.  Myrla Halsted MD

## 2020-10-02 NOTE — Addendum Note (Signed)
Addended by: Levon Hedger on: 10/02/2020 06:45 PM   Modules accepted: Orders

## 2020-10-03 MED ORDER — HYDROCOD POLST-CPM POLST ER 10-8 MG/5ML PO SUER: 5.0000 mL | Freq: Every evening | ORAL | 0 refills | Status: DC | PRN

## 2020-10-03 NOTE — Addendum Note (Signed)
Addended by: Levon Hedger on: 10/03/2020 07:26 PM   Modules accepted: Orders

## 2020-10-04 ENCOUNTER — Other Ambulatory Visit: Payer: Self-pay

## 2020-10-04 ENCOUNTER — Other Ambulatory Visit (HOSPITAL_COMMUNITY): Payer: Self-pay | Admitting: Psychiatry

## 2020-10-04 ENCOUNTER — Telehealth (INDEPENDENT_AMBULATORY_CARE_PROVIDER_SITE_OTHER): Payer: 59 | Admitting: Psychiatry

## 2020-10-04 DIAGNOSIS — F411 Generalized anxiety disorder: Secondary | ICD-10-CM

## 2020-10-04 DIAGNOSIS — F330 Major depressive disorder, recurrent, mild: Secondary | ICD-10-CM | POA: Diagnosis not present

## 2020-10-04 DIAGNOSIS — F900 Attention-deficit hyperactivity disorder, predominantly inattentive type: Secondary | ICD-10-CM

## 2020-10-04 DIAGNOSIS — F410 Panic disorder [episodic paroxysmal anxiety] without agoraphobia: Secondary | ICD-10-CM | POA: Diagnosis not present

## 2020-10-04 DIAGNOSIS — R002 Palpitations: Secondary | ICD-10-CM | POA: Diagnosis not present

## 2020-10-04 MED ORDER — CLONAZEPAM 0.5 MG PO TABS
0.5000 mg | ORAL_TABLET | Freq: Two times a day (BID) | ORAL | 2 refills | Status: DC | PRN
Start: 2020-10-04 — End: 2021-01-08

## 2020-10-04 MED ORDER — DULOXETINE HCL 60 MG PO CPEP: 60.0000 mg | ORAL_CAPSULE | Freq: Every day | ORAL | 1 refills | Status: DC

## 2020-10-04 MED ORDER — TRAZODONE HCL 150 MG PO TABS: 150.0000 mg | ORAL_TABLET | Freq: Every day | ORAL | 1 refills | Status: DC

## 2020-10-04 MED ORDER — LISDEXAMFETAMINE DIMESYLATE 30 MG PO CAPS
30.0000 mg | ORAL_CAPSULE | Freq: Every day | ORAL | 0 refills | Status: DC
Start: 2020-10-04 — End: 2020-11-08

## 2020-10-04 MED FILL — clonazePAM 0.5 MG TABS: 0.5 | 15 days supply | Qty: 30 | Fill #0

## 2020-10-04 MED FILL — DULOXETINE HCL 60 MG CPEP: 60 | 90 days supply | Qty: 90 | Fill #0

## 2020-10-04 MED FILL — VYVANSE 30 MG CAPSULE: 30 | 30 days supply | Qty: 30 | Fill #0

## 2020-10-04 NOTE — Progress Notes (Signed)
BH MD/PA/NP OP Progress Note  10/04/2020 9:17 AM Sherri Miller  MRN:  [id number] Interview was conducted using videoconferencing application and I verified that I was speaking with the correct person using two identifiers. I discussed the limitations of evaluation and management by telemedicine and  the availability of in person appointments. Patient expressed understanding and agreed to proceed. Patient location - home; physician - home office.  Chief Complaint: Lack of focus, residual anxiety.  HPI: 44 yo SWF with a hx of recurrent depression, anxiety and ADD. She reports high level of anxiety (both generalized and panic attacks) as well as depression, racing thoughts, irritability, distractibility, forgetfulness, insomnia (initial and middle) ever since she developed pulmonary embolism after receiving COVID vaccine in February this year. 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 She was diagnosed with ADHD when she was in 5th grade but did not start taking medication for this until college years (was on IR methylphenidate then taking it once daily).  She has been tried on escitalopram (sexual side effects) and bupropion for depression and on alprazolam, clonazepam for anxiety (also briefly on diazepam 5 mg for muscle relaxation/anxiety in immediate aftermath of pulmonary embolism). We have increased trazodone to 150 mg nightly for insomnia and she reports that it works very well. 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 Klonopin is helpful for anxiety attacks and she uses it very sparingly. Sherri Miller reports being in counseling twice a month. We have added duloxetine last month, she is now on 60 mg, tolerates it well and reports clear improvement in mood (less depressed, more active).     Visit Diagnosis:    ICD-10-CM   1. Attention deficit hyperactivity disorder (ADHD), predominantly inattentive type  F90.0   2. GAD (generalized anxiety disorder)  F41.1  traZODone (DESYREL) 150 MG tablet  3. Major depressive disorder, recurrent episode, mild (HCC)  F33.0   4. Panic disorder  F41.0     Past Psychiatric History: Please see intake H&P.  Past Medical History:  Past Medical History:  Diagnosis Date  . Allergy   . Chicken pox   . Depression   . Migraine   . Migraines    No past surgical history on file.  Family Psychiatric History: Unknown.  Family History:  Family History  Adopted: Yes  Family history unknown: Yes    Social History:  Social History   Socioeconomic History  . Marital status: Single    Spouse name: Not on file  . Number of children: Not on file  . Years of education: Not on file  . Highest education level: Not on file  Occupational History  . Occupation: Nurse  Tobacco Use  . Smoking status: Never Smoker  . Smokeless tobacco: Never Used  Vaping Use  . Vaping Use: Never used  Substance and Sexual Activity  . Alcohol use: No    Alcohol/week: 0.0 standard drinks  . Drug use: No  . Sexual activity: Yes    Birth control/protection: Pill  Other Topics Concern  . Not on file  Social History Narrative   Works as a Copycritical care nurse.   Social Determinants of Health   Financial Resource Strain:   . Difficulty of Paying Living Expenses: Not on file  Food Insecurity:   . Worried About Programme researcher, broadcasting/film/videounning Out of Food in the Last Year: Not on file  . Ran Out of Food in the Last Year: Not on file  Transportation Needs:   .  Lack of Transportation (Medical): Not on file  . Lack of Transportation (Non-Medical): Not on file  Physical Activity:   . Days of Exercise per Week: Not on file  . Minutes of Exercise per Session: Not on file  Stress:   . Feeling of Stress : Not on file  Social Connections:   . Frequency of Communication with Friends and Family: Not on file  . Frequency of Social Gatherings with Friends and Family: Not on file  . Attends Religious Services: Not on file  . Active Member of Clubs or  Organizations: Not on file  . Attends Banker Meetings: Not on file  . Marital Status: Not on file    Allergies:  Allergies  Allergen Reactions  . Codeine Nausea And Vomiting  . Penicillins Hives    Did it involve swelling of the face/tongue/throat, SOB, or low BP?No Did it involve sudden or severe rash/hives, skin peeling, or any reaction on the inside of your mouth or nose? Yes Did you need to seek medical attention at a hospital or doctor's office?No When did it last happen?Teenager If all above answers are "NO", may proceed with cephalosporin use.    Metabolic Disorder Labs: No results found for: HGBA1C, MPG No results found for: PROLACTIN No results found for: CHOL, TRIG, HDL, CHOLHDL, VLDL, LDLCALC No results found for: TSH  Therapeutic Level Labs: No results found for: LITHIUM No results found for: VALPROATE No components found for:  CBMZ  Current Medications: Current Outpatient Medications  Medication Sig Dispense Refill  . acetaminophen (TYLENOL) 500 MG tablet Take 1 tablet (500 mg total) by mouth 3 (three) times daily. 100 tablet 2  . apixaban (ELIQUIS) 5 MG TABS tablet Take 1 tablet (5 mg total) by mouth 2 (two) times daily. 180 tablet 3  . benzonatate (TESSALON) 100 MG capsule Take 1 capsule (100 mg total) by mouth every 6 (six) hours as needed for cough. 30 capsule 1  . chlorpheniramine-HYDROcodone (TUSSIONEX PENNKINETIC ER) 10-8 MG/5ML SUER Take 5 mLs by mouth at bedtime as needed for cough. 115 mL 0  . clonazePAM (KLONOPIN) 0.5 MG tablet Take 1 tablet (0.5 mg total) by mouth 2 (two) times daily as needed for anxiety. 30 tablet 2  . DULoxetine (CYMBALTA) 60 MG capsule Take 1 capsule (60 mg total) by mouth daily. 90 capsule 1  . famotidine (PEPCID) 20 MG tablet Take 1 tablet (20 mg total) by mouth 2 (two) times daily. 180 tablet 3  . fluticasone furoate-vilanterol (BREO ELLIPTA) 100-25 MCG/INH AEPB Inhale 1 puff into the lungs daily. 30 each 11  .  Fluticasone-Salmeterol (ADVAIR DISKUS) 250-50 MCG/DOSE AEPB Inhale 1 puff into the lungs 2 (two) times daily. (Patient not taking: Reported on 01/11/2020) 60 each 11  . furosemide (LASIX) 20 MG tablet Take 1 tablet (20 mg total) by mouth daily as needed for edema. 30 tablet 1  . ibuprofen (MOTRIN IB) 200 MG tablet Take 4 tablets (800 mg total) by mouth 3 (three) times daily before meals. 100 tablet 2  . lisdexamfetamine (VYVANSE) 30 MG capsule Take 1 capsule (30 mg total) by mouth daily. 30 capsule 0  . montelukast (SINGULAIR) 10 MG tablet Take 1 tablet (10 mg total) by mouth at bedtime. 30 tablet 11  . rizatriptan (MAXALT-MLT) 10 MG disintegrating tablet DISSOLVE 1 TABLET BY MOUTH AS NEEDED FOR MIGRAINE, MAY REPEAT IN 2 HOURS IF NEEDED. NO MORE THAN 30MG  IN 24 HOURS (Patient taking differently: Take 10 mg by mouth daily as  needed for migraine. ) 10 tablet 0  . [START ON 10/16/2020] traZODone (DESYREL) 150 MG tablet Take 1 tablet (150 mg total) by mouth at bedtime. Needs Office visit for additional refills 90 tablet 1   No current facility-administered medications for this visit.       Psychiatric Specialty Exam: Review of Systems  Respiratory: Positive for shortness of breath.   Psychiatric/Behavioral: Positive for decreased concentration. The patient is nervous/anxious.   All other systems reviewed and are negative.   There were no vitals taken for this visit.There is no height or weight on file to calculate BMI.  General Appearance: Casual and Well Groomed  Eye Contact:  Good  Speech:  Clear and Coherent and Normal Rate  Volume:  Normal  Mood:  Anxious  Affect:  Full Range  Thought Process:  Goal Directed  Orientation:  Full (Time, Place, and Person)  Thought Content: Logical   Suicidal Thoughts:  No  Homicidal Thoughts:  No  Memory:  Immediate;   Good Recent;   Good Remote;   Good  Judgement:  Good  Insight:  Good  Psychomotor Activity:  Normal  Concentration:  Concentration:  Fair  Recall:  Good  Fund of Knowledge: Good  Language: Good  Akathisia:  Negative  Handed:  Right  AIMS (if indicated): not done  Assets:  Communication Skills Desire for Improvement Financial Resources/Insurance Housing Resilience Vocational/Educational  ADL's:  Intact  Cognition: WNL  Sleep:  Good   Screenings: GAD-7     Office Visit from 10/14/2019 in [facility] Office Visit from 10/28/2018 in [facility] Office Visit from 08/06/2018 in [facility]  Total GAD-7 Score 0 4 15    PHQ2-9     Office Visit from 10/14/2019 in [facility] Office Visit from 10/28/2018 in [facility] Office Visit from 08/06/2018 in [facility] Office Visit from 07/14/2015 in [facility] at [place] Office Visit from 03/29/2015 in [facility] at [facility] Total Score 0 1 5 0 0  PHQ-9 Total Score 0 1 17 -- --       Assessment and Plan: 44 yo SWF with a hx of recurrent depression, anxiety and ADD. She reports high level of anxiety (both generalized and panic attacks) as well as depression, racing thoughts, irritability, distractibility, forgetfulness, insomnia (initial and middle) ever since she developed pulmonary embolism after receiving COVID vaccine in February this year. She works in a high stress environment as a Copy. She was diagnosed with ADHD when she was in 5th grade but did not start taking medication for this until college years (was on IR methylphenidate then taking it once daily).  She has been tried on escitalopram (sexual side effects) and bupropion for depression and on alprazolam, clonazepam for anxiety (also briefly on diazepam 5 mg for muscle relaxation/anxiety in immediate aftermath of pulmonary embolism). We have increased trazodone to 150 mg nightly for insomnia and she reports that it works very well. She did not tolerate sertraline 50 mg -  developed alexithymia. Klonopin is helpful for anxiety attacks and she uses it very sparingly. Rosali reports being in counseling twice a month. We have added duloxetine last month, she is now on 60 mg, tolerates it well and reports clear improvement in mood (less depressed, more active).   Dx: Panic disorder; GAD; MDD recurrent mild; ADHD inattentive  Plan: We will continue duloxetine 60 mg, trazodone 150 mg at HS and  clonazepam 0.5 mg bid prn panic anxiety. We will try Vyvanse 30 mg given that her work days last 12 hours. We will meet again in 4 weeks. The plan was discussed with patient who had an opportunity to ask questions and these were all answered. I spend 20 minutes in video clinical contact with the patient   Magdalene Patricia, MD 10/04/2020, 9:17 AM

## 2020-10-07 ENCOUNTER — Telehealth: Payer: 59 | Admitting: Nurse Practitioner

## 2020-10-07 ENCOUNTER — Other Ambulatory Visit: Payer: Self-pay | Admitting: Nurse Practitioner

## 2020-10-07 DIAGNOSIS — B9689 Other specified bacterial agents as the cause of diseases classified elsewhere: Secondary | ICD-10-CM

## 2020-10-07 DIAGNOSIS — J069 Acute upper respiratory infection, unspecified: Secondary | ICD-10-CM

## 2020-10-07 MED ORDER — AZITHROMYCIN 250 MG PO TABS: ORAL_TABLET | ORAL | 0 refills | Status: DC

## 2020-10-07 MED ORDER — PREDNISONE 10 MG PO TABS: 10.0000 mg | ORAL_TABLET | Freq: Every day | ORAL | 0 refills | Status: DC

## 2020-10-07 MED ORDER — PREDNISONE 10 MG PO TABS: 10.0000 mg | ORAL_TABLET | Freq: Every day | ORAL | 0 refills | Status: AC

## 2020-10-07 NOTE — Progress Notes (Signed)
We are sorry that you are not feeling well.  Here is how we plan to help!  Based on your presentation I believe you most likely have A cough due to bacteria.  When patients have a fever and a productive cough with a change in color or increased sputum production, we are concerned about bacterial bronchitis.  If left untreated it can progress to pneumonia.  If your symptoms do not improve with your treatment plan it is important that you contact your provider.   I have prescribed Azithromyin 250 mg: two tablets now and then one tablet daily for 4 additonal days    In addition you may continue to use the cough medicine previously prescribed.  Prednisone 10 mg daily for 6 days (see taper instructions below)  Directions for 6 day taper: Day 1: 2 tablets before breakfast, 1 after both lunch & dinner and 2 at bedtime Day 2: 1 tab before breakfast, 1 after both lunch & dinner and 2 at bedtime Day 3: 1 tab at each meal & 1 at bedtime Day 4: 1 tab at breakfast, 1 at lunch, 1 at bedtime Day 5: 1 tab at breakfast & 1 tab at bedtime Day 6: 1 tab at breakfast   From your responses in the eVisit questionnaire you describe inflammation in the upper respiratory tract which is causing a significant cough.  This is commonly called Bronchitis and has four common causes:    Allergies  Viral Infections  Acid Reflux  Bacterial Infection Allergies, viruses and acid reflux are treated by controlling symptoms or eliminating the cause. An example might be a cough caused by taking certain blood pressure medications. You stop the cough by changing the medication. Another example might be a cough caused by acid reflux. Controlling the reflux helps control the cough.  USE OF BRONCHODILATOR ("RESCUE") INHALERS: There is a risk from using your bronchodilator too frequently.  The risk is that over-reliance on a medication which only relaxes the muscles surrounding the breathing tubes can reduce the effectiveness of  medications prescribed to reduce swelling and congestion of the tubes themselves.  Although you feel brief relief from the bronchodilator inhaler, your asthma may actually be worsening with the tubes becoming more swollen and filled with mucus.  This can delay other crucial treatments, such as oral steroid medications. If you need to use a bronchodilator inhaler daily, several times per day, you should discuss this with your provider.  There are probably better treatments that could be used to keep your asthma under control.     HOME CARE . Only take medications as instructed by your medical team. . Complete the entire course of an antibiotic. . Drink plenty of fluids and get plenty of rest. . Avoid close contacts especially the very young and the elderly . Cover your mouth if you cough or cough into your sleeve. . Always remember to wash your hands . A steam or ultrasonic humidifier can help congestion.   GET HELP RIGHT AWAY IF: . You develop worsening fever. . You become short of breath . You cough up blood. . Your symptoms persist after you have completed your treatment plan MAKE SURE YOU   Understand these instructions.  Will watch your condition.  Will get help right away if you are not doing well or get worse.  Your e-visit answers were reviewed by a board certified advanced clinical practitioner to complete your personal care plan.  Depending on the condition, your plan could have included both  over the counter or prescription medications. If there is a problem please reply  once you have received a response from your provider. Your safety is important to [place].  If you have drug allergies check your prescription carefully.    You can use MyChart to ask questions about today's visit, request a non-urgent call back, or ask for a work or school excuse for 24 hours related to this e-Visit. If it has been greater than 24 hours you will need to follow up with your provider, or enter a new  e-Visit to address those concerns. You will get an e-mail in the next two days asking about your experience.  I hope that your e-visit has been valuable and will speed your recovery. Thank you for using e-visits.   I have spent at least 5 minutes reviewing and documenting in the patient's chart.

## 2020-10-07 NOTE — Progress Notes (Signed)
Original prescription for prednisone was set to "print". Reordered medication to be sent to [facility].

## 2020-10-08 NOTE — Addendum Note (Signed)
Encounter addended by: Crissie Figures, RN on: 10/08/2020 2:45 PM  Actions taken: Imaging Exam ended

## 2020-10-19 ENCOUNTER — Telehealth (HOSPITAL_COMMUNITY): Payer: Self-pay

## 2020-10-19 NOTE — Telephone Encounter (Signed)
-----   Message from Laurey Morale, MD sent at 10/14/2020  9:24 PM EST ----- No worrisome arrhythmias.

## 2020-10-19 NOTE — Telephone Encounter (Signed)
°   Samara Snide, RN  10/19/2020 10:53 AM EST Back to Top    Patient advised and verbalized understanding    Philicia Glade Lloyd, CMA  10/17/2020 9:24 AM EST     [facility]   Philicia R Branch, CMA  10/15/2020 9:28 AM EST     [facility]

## 2020-10-26 MED FILL — RIZATRIPTAN BENZOATE 10 MG: 10 | 30 days supply | Qty: 12 | Fill #5

## 2020-10-26 MED FILL — JASMIEL 3-0.02 MG TABS: 3-0.02 | 72 days supply | Qty: 84 | Fill #1

## 2020-10-26 MED FILL — ELIQUIS 5 MG TABLET: 5 | 90 days supply | Qty: 180 | Fill #2

## 2020-10-26 MED FILL — traZODone HCL 150 MG TABS: 150 | 90 days supply | Qty: 90 | Fill #0

## 2020-10-31 ENCOUNTER — Other Ambulatory Visit (HOSPITAL_COMMUNITY): Payer: Self-pay | Admitting: Obstetrics and Gynecology

## 2020-10-31 DIAGNOSIS — G43109 Migraine with aura, not intractable, without status migrainosus: Secondary | ICD-10-CM | POA: Diagnosis not present

## 2020-10-31 DIAGNOSIS — N920 Excessive and frequent menstruation with regular cycle: Secondary | ICD-10-CM | POA: Insufficient documentation

## 2020-10-31 DIAGNOSIS — N921 Excessive and frequent menstruation with irregular cycle: Secondary | ICD-10-CM | POA: Diagnosis not present

## 2020-10-31 DIAGNOSIS — I2699 Other pulmonary embolism without acute cor pulmonale: Secondary | ICD-10-CM | POA: Diagnosis not present

## 2020-10-31 MED FILL — NORETHINDRONE 0.35 MG TABS: 0.35 | 84 days supply | Qty: 84 | Fill #0

## 2020-11-05 ENCOUNTER — Telehealth: Payer: Self-pay | Admitting: Internal Medicine

## 2020-11-05 NOTE — Telephone Encounter (Signed)
Patient will need to be seen in the office for further evaluation  Please contact office for sooner follow up if symptoms do not improve or worsen or seek emergency care

## 2020-11-05 NOTE — Telephone Encounter (Signed)
Primary Pulmonologist: Dr. Katrinka Blazing  Last office visit and with whom: none What do we see them for (pulmonary problems): PE in ED treated via Dr. Katrinka Blazing  Last OV assessment/plan: none  Was appointment offered to patient (explain)?  She wants to know if she needs a chest xray. Dr. Katrinka Blazing told her to make an appointment with office.  However, patient is having a cough patient states she gets Chronic Bronchitis and was about someone with a cold 7 weeks ago. She had tussionex but is out . Breo100 is not helping her and it is $75 with insurance a month.   Sending to DS and TP to advise if patient is good to come in for an appointment - she was seen by Dr. Katrinka Blazing in ED on (01/13/20) AND this would be an ACUTE visit.  But has not been seen in the office yet either. Only ED   Patient denies fevers.  She had a round of steroids and antibiotic 3 weeks ago.    Allergies  Allergen Reactions  . Codeine Nausea And Vomiting  . Penicillins Hives    Did it involve swelling of the face/tongue/throat, SOB, or low BP?No Did it involve sudden or severe rash/hives, skin peeling, or any reaction on the inside of your mouth or nose? Yes Did you need to seek medical attention at a hospital or doctor's office?No When did it last happen?Teenager If all above answers are "NO", may proceed with cephalosporin use.    Immunization History  Administered Date(s) Administered  . Influenza,inj,Quad PF,6+ Mos 09/07/2018  . Influenza-Unspecified 09/07/2018, 10/06/2019

## 2020-11-05 NOTE — Telephone Encounter (Signed)
Called and spoke with patient she is now scheduled with Dr. Craige Cotta tomorrow 11/06/20 at 2:30. Patient expressed understanding. Nothing further needed at this time.

## 2020-11-05 NOTE — Telephone Encounter (Signed)
Patient has appointment with Dr. Craige Cotta 11/06/20 . Dr. Chestine Spore sent [facility] a message about getting patient in for a visit.  Will route this to MH to follow up on

## 2020-11-06 ENCOUNTER — Ambulatory Visit (INDEPENDENT_AMBULATORY_CARE_PROVIDER_SITE_OTHER): Payer: 59 | Admitting: Pulmonary Disease

## 2020-11-06 ENCOUNTER — Other Ambulatory Visit: Payer: Self-pay | Admitting: Pulmonary Disease

## 2020-11-06 ENCOUNTER — Other Ambulatory Visit: Payer: Self-pay

## 2020-11-06 ENCOUNTER — Encounter: Payer: Self-pay | Admitting: Pulmonary Disease

## 2020-11-06 VITALS — BP 128/82 | HR 111 | Temp 98.0°F | Ht 68.0 in | Wt 242.4 lb

## 2020-11-06 DIAGNOSIS — R059 Cough, unspecified: Secondary | ICD-10-CM

## 2020-11-06 MED ORDER — ALBUTEROL SULFATE HFA 108 (90 BASE) MCG/ACT IN AERS: 2.0000 | INHALATION_SPRAY | Freq: Four times a day (QID) | RESPIRATORY_TRACT | 6 refills | Status: DC | PRN

## 2020-11-06 MED ORDER — HYDROCOD POLST-CPM POLST ER 10-8 MG/5ML PO SUER: 5.0000 mL | Freq: Every evening | ORAL | 0 refills | Status: DC | PRN

## 2020-11-06 MED ORDER — FLUTICASONE-SALMETEROL 250-50 MCG/DOSE IN AEPB: 1.0000 | INHALATION_SPRAY | Freq: Two times a day (BID) | RESPIRATORY_TRACT | 5 refills | Status: DC

## 2020-11-06 MED ORDER — LEVOCETIRIZINE DIHYDROCHLORIDE 5 MG PO TABS: 5.0000 mg | ORAL_TABLET | Freq: Every morning | ORAL | 5 refills | Status: DC

## 2020-11-06 MED FILL — LEVOCETIRIZINE 5 MG TABLET: 5 | 30 days supply | Qty: 30 | Fill #0

## 2020-11-06 MED FILL — ADVAIR 250/50 DISKUS: 250-50 | 30 days supply | Qty: 60 | Fill #0

## 2020-11-06 MED FILL — HYDROCODONE-CHLORPHEN ER SU: 10-8 | 23 days supply | Qty: 115 | Fill #0

## 2020-11-06 MED FILL — ALBUTEROL SULFATE HFA 108 (: 108 (90 BAS | 25 days supply | Qty: 18 | Fill #0

## 2020-11-06 NOTE — Progress Notes (Signed)
[facility]  Chief Complaint  Patient presents with  . Consult    post PE's, non productive cough    Constitutional:  BP 128/82 (BP Location: Left Arm, Cuff Size: Normal)   Pulse (!) 111   Temp 98 F (36.7 C) (Other (Comment)) Comment (Src): wrist  Ht 5\' 8"  (1.727 m)   Wt 242 lb 6.4 oz (110 kg)   SpO2 97% Comment: Room air  BMI 36.86 kg/m   Past Medical History:  Asthma, Migraine HA, Depression, Menorrhagia  Past Surgical History:  Her  has no past surgical history on file.  Brief Summary:  Sherri Miller is a 44 y.o. female with asthma and pulmonary embolism in February 2021.  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      Subjective:   She was found to have PE in February.  This happened a few days she got her 2nd COVID vaccine. Has been on eliquis.  Had trouble with vaginal bleeding, but improved.  She developed a respiratory infection few weeks ago.  She gets one of these every year.  This then triggers a coughing spell that can last for months.  She was treated with antibiotics and prednisone.  She was using breo but this is too expensive.  She started using an old advair prescription.  She has been using singulair.  Doesn't have albuterol.  Prescribed tussionex and this helped with cough especially at night and allowed her to sleep.  She is having more trouble getting short of breath with activities since she had the PE.  Physical Exam:   Appearance - well kempt   ENMT - no sinus tenderness, no oral exudate, no LAN, Mallampati 2 airway, no stridor  Respiratory - equal breath sounds bilaterally, no wheezing or rales  CV - s1s2 regular rate and rhythm, no murmurs  Ext - no clubbing, no edema  Skin - no rashes  Psych - normal mood and affect   Pulmonary testing:    Chest Imaging:   CT angio chest 01/10/20 >> acute b/l PE  Sleep Tests:    Cardiac Tests:   Doppler Rt leg 01/10/20 >> no DVT  Echo 04/05/20 >> EF 60 to  65%, mild LVH  Social History:  She  reports that she has never smoked. She has never used smokeless tobacco. She reports that she does not drink alcohol and does not use drugs.  Family History:  Her She was adopted. Family history is unknown by patient.     Assessment/Plan:   Cyclic cough triggered by recent respiratory infection in setting of asthma. - don't think she needs additional antibiotics or prednisone at this time - don't think she needs chest xray at this time - defer pulmonary function testing until her cough is better controlled - explained it will be a gradual process to improve her cough - breo was too expensive; will change to advair - continue singulair - prn albuterol - Xyzal 5 mg pill in the morning - Tussionex as needed at night to help with cough - Sip water with the urge to cough - Use sugarless candy to keep mouth moist - Salt water gargles twice per day - 1 teaspoon of local honey twice per day - Avoid clearing throat or forcing a cough - Allow voice to rest as able  Unprovoked pulmonary embolism. - long term therapy with eliquis - uncertain whether this was related to previous COVID vaccination  Dyspnea on exertion. -  has been seen by Dr. Shirlee Latch with [facility] - has cardiopulmonary exercise test ordered  Time Spent Involved in Patient Care on Day of Examination:  34 minutes  Follow up:  Patient Instructions  Advair one puff twice per day and rinse mouth after each use Albuterol two puffs every 6 hours as needed for cough, wheeze, or chest congestion Xyzal 5 mg pill in the morning Tussionex as needed at night to help with cough Sip water when you have the urge to cough Use sugarless candy to keep your mouth moist Salt water gargles twice per day 1 teaspoon of local honey twice per day Avoid clearing your throat or forcing a cough Allow your voice to rest as able  Follow up in 2 weeks with Dr. Craige Cotta or Nurse  Practitioner   Medication List:   Allergies as of 11/06/2020      Reactions   Codeine Nausea And Vomiting   Penicillins Hives   Did it involve swelling of the face/tongue/throat, SOB, or low BP?No Did it involve sudden or severe rash/hives, skin peeling, or any reaction on the inside of your mouth or nose? Yes Did you need to seek medical attention at a hospital or doctor's office?No When did it last happen?Teenager If all above answers are "NO", may proceed with cephalosporin use.      Medication List       Accurate as of November 06, 2020  5:29 PM. If you have any questions, ask your nurse or doctor.        STOP taking these medications   acetaminophen 500 MG tablet Commonly known as: TYLENOL Stopped by: [name], MD   azithromycin 250 MG tablet Commonly known as: ZITHROMAX Stopped by: [name], MD   benzonatate 100 MG capsule Commonly known as: TESSALON Stopped by: [name], MD   fluticasone furoate-vilanterol 100-25 MCG/INH Aepb Commonly known as: BREO ELLIPTA Stopped by: [name], MD     TAKE these medications   albuterol 108 (90 Base) MCG/ACT inhaler Commonly known as: VENTOLIN HFA Inhale 2 puffs into the lungs every 6 (six) hours as needed for wheezing or shortness of breath. Started by: [name], MD   apixaban 5 MG Tabs tablet Commonly known as: ELIQUIS Take 1 tablet (5 mg total) by mouth 2 (two) times daily.   chlorpheniramine-HYDROcodone 10-8 MG/5ML Suer Commonly known as: Tussionex Pennkinetic ER Take 5 mLs by mouth at bedtime as needed for cough.   clonazePAM 0.5 MG tablet Commonly known as: KLONOPIN Take 1 tablet (0.5 mg total) by mouth 2 (two) times daily as needed for anxiety.   DULoxetine 60 MG capsule Commonly known as: CYMBALTA Take 1 capsule (60 mg total) by mouth daily.   famotidine 20 MG tablet Commonly known as: PEPCID Take 1 tablet (20 mg total) by mouth 2 (two) times daily.   Fluticasone-Salmeterol 250-50  MCG/DOSE Aepb Commonly known as: Advair Diskus Inhale 1 puff into the lungs in the morning and at bedtime. Started by: [name], MD   furosemide 20 MG tablet Commonly known as: Lasix Take 1 tablet (20 mg total) by mouth daily as needed for edema.   ibuprofen 200 MG tablet Commonly known as: Motrin IB Take 4 tablets (800 mg total) by mouth 3 (three) times daily before meals.   levocetirizine 5 MG tablet Commonly known as: Xyzal Take 1 tablet (5 mg total) by mouth in the morning. Started by: [name], MD   lisdexamfetamine 30 MG capsule Commonly known as:  Vyvanse Take 1 capsule (30 mg total) by mouth daily.   montelukast 10 MG tablet Commonly known as: SINGULAIR Take 1 tablet (10 mg total) by mouth at bedtime.   rizatriptan 10 MG disintegrating tablet Commonly known as: MAXALT-MLT DISSOLVE 1 TABLET BY MOUTH AS NEEDED FOR MIGRAINE, MAY REPEAT IN 2 HOURS IF NEEDED. NO MORE THAN 30MG  IN 24 HOURS What changed: See the new instructions.   traZODone 150 MG tablet Commonly known as: DESYREL Take 1 tablet (150 mg total) by mouth at bedtime. Needs Office visit for additional refills       Signature:  , MD [facility]/Critical Care Pager - [phone] 11/06/2020, 5:29 PM

## 2020-11-06 NOTE — Patient Instructions (Signed)
Advair one puff twice per day and rinse mouth after each use Albuterol two puffs every 6 hours as needed for cough, wheeze, or chest congestion Xyzal 5 mg pill in the morning Tussionex as needed at night to help with cough Sip water when you have the urge to cough Use sugarless candy to keep your mouth moist Salt water gargles twice per day 1 teaspoon of local honey twice per day Avoid clearing your throat or forcing a cough Allow your voice to rest as able  Follow up in 2 weeks with Dr. Craige Cotta or Nurse Practitioner

## 2020-11-08 ENCOUNTER — Other Ambulatory Visit: Payer: Self-pay

## 2020-11-08 ENCOUNTER — Telehealth (INDEPENDENT_AMBULATORY_CARE_PROVIDER_SITE_OTHER): Payer: 59 | Admitting: Psychiatry

## 2020-11-08 ENCOUNTER — Other Ambulatory Visit (HOSPITAL_COMMUNITY): Payer: Self-pay | Admitting: Psychiatry

## 2020-11-08 DIAGNOSIS — F411 Generalized anxiety disorder: Secondary | ICD-10-CM | POA: Diagnosis not present

## 2020-11-08 DIAGNOSIS — F330 Major depressive disorder, recurrent, mild: Secondary | ICD-10-CM

## 2020-11-08 DIAGNOSIS — F410 Panic disorder [episodic paroxysmal anxiety] without agoraphobia: Secondary | ICD-10-CM

## 2020-11-08 DIAGNOSIS — F900 Attention-deficit hyperactivity disorder, predominantly inattentive type: Secondary | ICD-10-CM | POA: Diagnosis not present

## 2020-11-08 MED ORDER — LISDEXAMFETAMINE DIMESYLATE 50 MG PO CAPS: 50.0000 mg | ORAL_CAPSULE | Freq: Every day | ORAL | 0 refills | Status: DC

## 2020-11-08 NOTE — Progress Notes (Signed)
[facility] MD/PA/NP OP Progress Note  11/08/2020 10:13 Sherri Sherri Miller  MRN:  [id number] Interview was conducted using videoconferencing application and I verified that I was speaking with the correct person using two identifiers. I discussed the limitations of evaluation and management by telemedicine and  the availability of in person appointments. Patient expressed understanding and agreed to proceed. Participants in the visit: patient (location - work); physician (location - home office).  Chief Complaint: Still lack of focus.  HPI: 44 yo SWF with a hx of recurrent depression, anxiety and ADD. Sherri Miller level of anxiety (both generalized and panic attacks) as well as depression, racing thoughts, irritability, distractibility, forgetfulness, insomnia (initial and middle) ever since she developed pulmonary embolism after receiving COVID vaccine in February this year. She works in a high stress environment as a Copy. She was diagnosed with ADHD when she was in 5th grade but did not start taking medication for this until college years (was onIRmethylphenidate then taking it once daily).  She has been tried on escitalopram (sexual side effects) and bupropion for depression and on alprazolam, clonazepam for anxiety (also briefly on diazepam 5 mg for muscle relaxation/anxiety in immediate aftermath of pulmonary embolism).We have increasedtrazodoneto 150mg  nightly for insomnia and she reports that it works very well. 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 Klonopin is helpful for anxiety attacks and she uses it very sparingly.Sherri Miller reports being in counseling twice a month. We have added duloxetine last month, she is now on 60 mg, tolerates it well and reports clear improvement in mood (less depressed, more active). Last month we added Vyvanse 30 mg for ADD - minimally effective but well tolerated.   Visit Diagnosis:    ICD-10-CM   1. Panic disorder  F41.0    2. Major depressive disorder, recurrent episode, mild (HCC)  F33.0   3. GAD (generalized anxiety disorder)  F41.1   4. Attention deficit hyperactivity disorder (ADHD), predominantly inattentive type  F90.0     Past Psychiatric History: Please see intake H&P.  Past Medical History:  Past Medical History:  Diagnosis Date  . Allergy   . Asthma   . Chicken pox   . Depression   . Migraine   . Migraines   . Pulmonary embolism (HCC)   . Pulmonary embolism (HCC) 01/2020   No past surgical history on file.  Family Psychiatric History: None.  Family History:  Family History  Adopted: Yes  Family history unknown: Yes    Social History:  Social History   Socioeconomic History  . Marital status: Single    Spouse name: Not on file  . Number of children: Not on file  . Years of education: Not on file  . Highest education level: Not on file  Occupational History  . Occupation: Nurse  Tobacco Use  . Smoking status: Never Smoker  . Smokeless tobacco: Never Used  Vaping Use  . Vaping Use: Never used  Substance and Sexual Activity  . Alcohol use: No    Alcohol/week: 0.0 standard drinks  . Drug use: No  . Sexual activity: Yes    Birth control/protection: Pill  Other Topics Concern  . Not on file  Social History Narrative   Works as a 02/2020.   Social Determinants of Health   Financial Resource Strain:   . Difficulty of Paying Living Expenses: Not on file  Food Insecurity:   . Worried About Copy in the Last Year: Not on  file  . Ran Out of Food in the Last Year: Not on file  Transportation Needs:   . Lack of Transportation (Medical): Not on file  . Lack of Transportation (Non-Medical): Not on file  Physical Activity:   . Days of Exercise per Week: Not on file  . Minutes of Exercise per Session: Not on file  Stress:   . Feeling of Stress : Not on file  Social Connections:   . Frequency of Communication with Friends and Family: Not on file   . Frequency of Social Gatherings with Friends and Family: Not on file  . Attends Religious Services: Not on file  . Active Member of Clubs or Organizations: Not on file  . Attends Banker Meetings: Not on file  . Marital Status: Not on file    Allergies:  Allergies  Allergen Reactions  . Codeine Nausea And Vomiting  . Penicillins Hives    Did it involve swelling of the face/tongue/throat, SOB, or low BP?No Did it involve sudden or severe rash/hives, skin peeling, or any reaction on the inside of your mouth or nose? Yes Did you need to seek medical attention at a hospital or doctor's office?No When did it last happen?Teenager If all above answers are "NO", may proceed with cephalosporin use.    Metabolic Disorder Labs: No results found for: HGBA1C, MPG No results found for: PROLACTIN No results found for: CHOL, TRIG, HDL, CHOLHDL, VLDL, LDLCALC No results found for: TSH  Therapeutic Level Labs: No results found for: LITHIUM No results found for: VALPROATE No components found for:  CBMZ  Current Medications: Current Outpatient Medications  Medication Sig Dispense Refill  . albuterol (VENTOLIN HFA) 108 (90 Base) MCG/ACT inhaler Inhale 2 puffs into the lungs every 6 (six) hours as needed for wheezing or shortness of breath. 8 g 6  . apixaban (ELIQUIS) 5 MG TABS tablet Take 1 tablet (5 mg total) by mouth 2 (two) times daily. 180 tablet 3  . chlorpheniramine-HYDROcodone (TUSSIONEX PENNKINETIC ER) 10-8 MG/5ML SUER Take 5 mLs by mouth at bedtime as needed for cough. 115 mL 0  . clonazePAM (KLONOPIN) 0.5 MG tablet Take 1 tablet (0.5 mg total) by mouth 2 (two) times daily as needed for anxiety. 30 tablet 2  . DULoxetine (CYMBALTA) 60 MG capsule Take 1 capsule (60 mg total) by mouth daily. 90 capsule 1  . famotidine (PEPCID) 20 MG tablet Take 1 tablet (20 mg total) by mouth 2 (two) times daily. 180 tablet 3  . Fluticasone-Salmeterol (ADVAIR DISKUS) 250-50 MCG/DOSE AEPB  Inhale 1 puff into the lungs in the morning and at bedtime. 60 each 5  . furosemide (LASIX) 20 MG tablet Take 1 tablet (20 mg total) by mouth daily as needed for edema. 30 tablet 1  . ibuprofen (MOTRIN IB) 200 MG tablet Take 4 tablets (800 mg total) by mouth 3 (three) times daily before meals. 100 tablet 2  . levocetirizine (XYZAL) 5 MG tablet Take 1 tablet (5 mg total) by mouth in the morning. 30 tablet 5  . [START ON 11/09/2020] lisdexamfetamine (VYVANSE) 50 MG capsule Take 1 capsule (50 mg total) by mouth daily. 30 capsule 0  . montelukast (SINGULAIR) 10 MG tablet Take 1 tablet (10 mg total) by mouth at bedtime. 30 tablet 11  . rizatriptan (MAXALT-MLT) 10 MG disintegrating tablet DISSOLVE 1 TABLET BY MOUTH AS NEEDED FOR MIGRAINE, MAY REPEAT IN 2 HOURS IF NEEDED. NO MORE THAN 30MG  IN 24 HOURS (Patient taking differently: Take 10 mg  by mouth daily as needed for migraine. ) 10 tablet 0  . traZODone (DESYREL) 150 MG tablet Take 1 tablet (150 mg total) by mouth at bedtime. Needs Office visit for additional refills 90 tablet 1   No current facility-administered medications for this visit.      Psychiatric Specialty Exam: Review of Systems  Psychiatric/Behavioral: Positive for decreased concentration. The patient is nervous/anxious.   All other systems reviewed and are negative.   There were no vitals taken for this visit.There is no height or weight on file to calculate BMI.  General Appearance: Casual and Well Groomed  Eye Contact:  Good  Speech:  Clear and Coherent and Normal Rate  Volume:  Normal  Mood:  Anxious  Affect:  Full Range  Thought Process:  Goal Directed  Orientation:  Full (Time, Place, and Person)  Thought Content: Logical   Suicidal Thoughts:  No  Homicidal Thoughts:  No  Memory:  Immediate;   Good Recent;   Good Remote;   Good  Judgement:  Good  Insight:  Good  Psychomotor Activity:  Normal  Concentration:  Concentration: Fair  Recall:  Good  Fund of Knowledge:  Good  Language: Good  Akathisia:  Negative  Handed:  Right  AIMS (if indicated): not done  Assets:  Communication Skills Desire for Improvement Financial Resources/Insurance Housing Social Support Talents/Skills Vocational/Educational  ADL's:  Intact  Cognition: WNL  Sleep:  Good   Screenings: GAD-7     Office Visit from 10/14/2019 in [facility] Office Visit from 10/28/2018 in [facility] Office Visit from 08/06/2018 in [facility]  Total GAD-7 Score 0 4 15    PHQ2-9     Office Visit from 10/14/2019 in [facility] Office Visit from 10/28/2018 in [facility] Office Visit from 08/06/2018 in [facility] Office Visit from 07/14/2015 in [facility] [facility] from [facility]2015 [facility]  PHQ-2 Total Score 0 1 5 0 0  PHQ-9 Total Score 0 1 17 -- --       Assessment and Plan: 44 yo SWF with a hx of recurrent depression, anxiety and ADD. Sherri Miller level of anxiety (both generalized and panic attacks) as well as depression, racing thoughts, irritability, distractibility, forgetfulness, insomnia (initial and middle) ever since she developed pulmonary embolism after receiving COVID vaccine in February this year. 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 She was diagnosed with ADHD when she was in 5th grade but did not start taking medication for this until college years (was onIRmethylphenidate then taking it once daily).  She has been tried on escitalopram (sexual side effects) and bupropion for depression and on alprazolam, clonazepam for anxiety (also briefly on diazepam 5 mg for muscle relaxation/anxiety in immediate aftermath of pulmonary embolism).We have increasedtrazodoneto 150mg  nightly for insomnia and she reports that it works very well. 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  Klonopin is helpful for anxiety attacks and she uses it very sparingly.Sherri Miller reports being in counseling twice a month. We have added duloxetine last month, she is now on 60 mg, tolerates it well and reports clear improvement in mood (less depressed, more active). Last month we added Vyvanse 30 mg for ADD - minimally effective but well tolerated.  Dx: Panic disorder; GAD; MDD recurrent mild; ADHD inattentive  Plan: We will continue duloxetine 60 mg, trazodone 150 mg at HSandclonazepam 0.5 mg  bid prn panic anxiety. We will increase Vyvanse dose to 50 mg. We willmeet again in4 weeks. The plan was discussed with patient who had an opportunity to ask questions and these were all answered. I spend 15 minutes in video clinical contact with the patient   Magdalene Patricia, MD 11/08/2020, 10:13 Sherri

## 2020-11-09 MED FILL — clonazePAM 0.5 MG TABS: 0.5 | 15 days supply | Qty: 30 | Fill #1

## 2020-11-09 MED FILL — VYVANSE 50 MG CAPSULE: 50 | 30 days supply | Qty: 30 | Fill #0

## 2020-11-20 ENCOUNTER — Other Ambulatory Visit: Payer: Self-pay

## 2020-11-20 ENCOUNTER — Ambulatory Visit: Payer: 59 | Admitting: Pulmonary Disease

## 2020-11-20 ENCOUNTER — Encounter: Payer: Self-pay | Admitting: Pulmonary Disease

## 2020-11-20 VITALS — BP 126/70 | HR 73 | Temp 97.7°F | Ht 68.0 in | Wt 238.8 lb

## 2020-11-20 DIAGNOSIS — R059 Cough, unspecified: Secondary | ICD-10-CM

## 2020-11-20 DIAGNOSIS — R0602 Shortness of breath: Secondary | ICD-10-CM | POA: Insufficient documentation

## 2020-11-20 DIAGNOSIS — J309 Allergic rhinitis, unspecified: Secondary | ICD-10-CM | POA: Diagnosis not present

## 2020-11-20 MED ORDER — HYDROCOD POLST-CPM POLST ER 10-8 MG/5ML PO SUER: 5.0000 mL | Freq: Every evening | ORAL | 0 refills | Status: DC | PRN

## 2020-11-20 NOTE — Progress Notes (Signed)
@Patient  ID: , female    DOB: Jun 18, 1976, 44 y.o.   MRN: [id number]  Chief Complaint  Patient presents with   Follow-up    Pt states she is getting better from last visit and states cough is getting better. Denies any complaints of wheezing and still becomes SOB with ambulation.    Referring provider: [id number], NP  HPI:  44 year old female never smoker found our office for chronic cough, asthma, VTE event-unprovoked PE  PMH: Anxiety, depression, ADHD Smoker/ Smoking History: Never smoker Maintenance: Advair 250 Pt of: Dr. 54  11/20/2020  - Visit   44 year old female never smoker presenting today as a 2-week follow-up from seeing Dr. 54 on 11/06/2020.  Patient with cyclical cough.  She is encouraged to start Advair, Breo Ellipta was discontinued due to cost, continue Singulair, continue Xyzal, continue Tussionex as needed, continue salt water gargles twice per day, use sugarless candy to keep mouth moist, continue follow-up with Dr. 11/08/2020 and cardiology and complete cardiopulmonary exercise test for evaluation of dyspnea on exertion.  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  Patient presenting to office today reporting that she is getting better since last visit and cough is improving.  Denies any complaints of wheezing but still short of breath.   Patient is a [employer]lee [employer]atch in the [facility].  She reports she is still having issues with cough.  Specific triggers are heat, stress, sometimes coughing at night.  She does feel the cough has improved some.  She has been taking her Advair consistently.  She reports that she tries to be consistent with using Tessalon Perles but sometimes forgets.  She has tried to stop using the Tussionex cough medicine at night, she has been unsuccessful with this.  Unfortunately she has not used any over-the-counter options such as Delsym.  We will discuss this today.  Patient has not yet completed the cardiopulmonary  exercise test as cardiology would like for her cough to be stable before proceeding forward.  She has never had pulmonary function testing done before.  She reports a seasonal cough usually starting at wintertime through April every year for the last 10 years.  She feels the cough is worsened.    Last chest imaging was February/2021 which was clear.  Questionaires / Pulmonary Flowsheets:   ACT:  No flowsheet data found.  MMRC: No flowsheet data found.  Epworth:  No flowsheet data found.  Tests:    01/13/2020-chest x-ray-stable exam, no new airspace opacity  01/10/2020-CTA chest-acute bilateral PE, left greater than right with CT evidence of right heart strain, consistent with at least submassive PE  01/11/2020-echocardiogram-LV ejection fraction 55 to 60%, no left ventricular hypertrophy, global right ventricle is normal systolic function  04/05/2020-echocardiogram-LV ejection fraction 60 to 65%, right ventricular systolic function is normal, right ventricular size is normal, normal pulmonary artery systolic pressure FENO:  No results found for: NITRICOXIDE  PFT: No flowsheet data found.  WALK:  No flowsheet data found.  Imaging: No results found.  Lab Results:  CBC    Component Value Date/Time   WBC 6.2 01/13/2020 1128   RBC 4.42 01/13/2020 1128   HGB 12.6 01/13/2020 1128   HCT 38.0 01/13/2020 1128   PLT 194 01/13/2020 1128   MCV 86.0 01/13/2020 1128   MCV 82.9 03/26/2015 1231   MCH 28.5 01/13/2020 1128   MCHC 33.2 01/13/2020 1128   RDW 12.1 01/13/2020 1128   LYMPHSABS 1.9 01/10/2020 2024   MONOABS 0.7 01/10/2020  2024   EOSABS 0.2 01/10/2020 2024   BASOSABS 0.1 01/10/2020 2024    BMET    Component Value Date/Time   NA 138 01/13/2020 1128   K 3.7 01/13/2020 1128   CL 108 01/13/2020 1128   CO2 19 (L) 01/13/2020 1128   GLUCOSE 98 01/13/2020 1128   BUN 9 01/13/2020 1128   CREATININE 0.98 01/13/2020 1128   CALCIUM 9.4 01/13/2020 1128   GFRNONAA >60 01/13/2020  1128   GFRAA >60 01/13/2020 1128    BNP    Component Value Date/Time   BNP 40.4 09/13/2020 1100    ProBNP No results found for: PROBNP  Specialty Problems      Pulmonary Problems   Allergic rhinitis    Qualifier: Diagnosis of  By: Lawernce Ion, CMA (AAMA), Bethann Berkshire   Overview:  Overview:  Qualifier: Diagnosis of  By: Lawernce Ion, CMA (AAMA), Shannon S      OTHER ACUTE SINUSITIS    Qualifier: Diagnosis of  By: Fabian Sharp MD, Neta Mends       URI    Qualifier: Diagnosis of  By: Amador Cunas  MD, Janett Labella       Cough   SOB (shortness of breath)      Allergies  Allergen Reactions   Codeine Nausea And Vomiting   Penicillins Hives    Did it involve swelling of the face/tongue/throat, SOB, or low BP?No Did it involve sudden or severe rash/hives, skin peeling, or any reaction on the inside of your mouth or nose? Yes Did you need to seek medical attention at a hospital or doctor's office?No When did it last happen?Teenager If all above answers are NO, may proceed with cephalosporin use.    Immunization History  Administered Date(s) Administered   Influenza,inj,Quad PF,6+ Mos 09/07/2018, 08/08/2020   Influenza-Unspecified 09/07/2018, 10/06/2019   PFIZER SARS-COV-2 Vaccination 12/06/2019, 12/26/2019    Past Medical History:  Diagnosis Date   Allergy    Asthma    Chicken pox    Depression    Migraine    Migraines    Pulmonary embolism (HCC)    Pulmonary embolism (HCC) 01/2020    Tobacco History: Social History   Tobacco Use  Smoking Status Never Smoker  Smokeless Tobacco Never Used   Counseling given: Not Answered   Continue to not smoke  Outpatient Encounter Medications as of 11/20/2020  Medication Sig   albuterol (VENTOLIN HFA) 108 (90 Base) MCG/ACT inhaler Inhale 2 puffs into the lungs every 6 (six) hours as needed for wheezing or shortness of breath.   apixaban (ELIQUIS) 5 MG TABS tablet Take by mouth.   clonazePAM (KLONOPIN)  0.5 MG tablet Take 1 tablet (0.5 mg total) by mouth 2 (two) times daily as needed for anxiety.   DULoxetine (CYMBALTA) 60 MG capsule Take 1 capsule (60 mg total) by mouth daily.   famotidine (PEPCID) 20 MG tablet Take 1 tablet (20 mg total) by mouth 2 (two) times daily.   Fluticasone-Salmeterol (ADVAIR DISKUS) 250-50 MCG/DOSE AEPB Inhale 1 puff into the lungs in the morning and at bedtime.   furosemide (LASIX) 20 MG tablet Take 1 tablet (20 mg total) by mouth daily as needed for edema.   ibuprofen (MOTRIN IB) 200 MG tablet Take 4 tablets (800 mg total) by mouth 3 (three) times daily before meals.   levocetirizine (XYZAL) 5 MG tablet Take 1 tablet (5 mg total) by mouth in the morning.   lisdexamfetamine (VYVANSE) 50 MG capsule Take 1 capsule (50 mg total) by mouth daily.  montelukast (SINGULAIR) 10 MG tablet Take 1 tablet (10 mg total) by mouth at bedtime.   rizatriptan (MAXALT-MLT) 10 MG disintegrating tablet DISSOLVE 1 TABLET BY MOUTH AS NEEDED FOR MIGRAINE, MAY REPEAT IN 2 HOURS IF NEEDED. NO MORE THAN 30MG  IN 24 HOURS (Patient taking differently: Take 10 mg by mouth daily as needed for migraine.)   traZODone (DESYREL) 150 MG tablet Take 1 tablet (150 mg total) by mouth at bedtime. Needs Office visit for additional refills   [DISCONTINUED] chlorpheniramine-HYDROcodone (TUSSIONEX PENNKINETIC ER) 10-8 MG/5ML SUER Take 5 mLs by mouth at bedtime as needed for cough.   apixaban (ELIQUIS) 5 MG TABS tablet Take 1 tablet (5 mg total) by mouth 2 (two) times daily.   chlorpheniramine-HYDROcodone (TUSSIONEX PENNKINETIC ER) 10-8 MG/5ML SUER Take 5 mLs by mouth at bedtime as needed for cough.   No facility-administered encounter medications on file as of 11/20/2020.     Review of Systems  Review of Systems  Constitutional: Positive for fatigue. Negative for activity change and fever.  HENT: Negative for congestion, sinus pressure, sinus pain and sore throat.   Respiratory: Positive for  cough and shortness of breath. Negative for wheezing.   Cardiovascular: Negative for chest pain and palpitations.  Gastrointestinal: Negative for diarrhea, nausea and vomiting.  Musculoskeletal: Negative for arthralgias.  Neurological: Negative for dizziness.  Psychiatric/Behavioral: Negative for sleep disturbance. The patient is not nervous/anxious.      Physical Exam  BP 126/70 (BP Location: Left Arm, Cuff Size: Large)    Pulse 73    Temp 97.7 F (36.5 C) (Other (Comment)) Comment (Src): wrist   Ht 5\' 8"  (1.727 m)    Wt 238 lb 12.8 oz (108.3 kg)    SpO2 98%    BMI 36.31 kg/m   Wt Readings from Last 5 Encounters:  11/20/20 238 lb 12.8 oz (108.3 kg)  11/06/20 242 lb 6.4 oz (110 kg)  09/13/20 242 lb 6.4 oz (110 kg)  01/13/20 231 lb 4.2 oz (104.9 kg)  01/11/20 231 lb 6.4 oz (105 kg)    BMI Readings from Last 5 Encounters:  11/20/20 36.31 kg/m  11/06/20 36.86 kg/m  09/13/20 36.86 kg/m  01/13/20 35.69 kg/m  01/11/20 35.71 kg/m     Physical Exam Vitals and nursing note reviewed.  Constitutional:      General: She is not in acute distress.    Appearance: Normal appearance. She is obese.  HENT:     Head: Normocephalic and atraumatic.     Right Ear: External ear normal.     Left Ear: External ear normal.  Cardiovascular:     Rate and Rhythm: Normal rate and regular rhythm.     Pulses: Normal pulses.     Heart sounds: Normal heart sounds. No murmur heard.   Pulmonary:     Effort: Pulmonary effort is normal. No respiratory distress.     Breath sounds: Normal breath sounds. No decreased air movement. No decreased breath sounds, wheezing or rales.  Musculoskeletal:     Cervical back: Normal range of motion.  Skin:    General: Skin is warm and dry.     Capillary Refill: Capillary refill takes less than 2 seconds.  Neurological:     General: No focal deficit present.     Mental Status: She is alert and oriented to person, place, and time. Mental status is at baseline.   Psychiatric:        Mood and Affect: Mood normal.  Behavior: Behavior normal.        Thought Content: Thought content normal.        Judgment: Judgment normal.       Assessment & Plan:   Allergic rhinitis Plan: Continue Singulair Continue Xyzal   Cough Suspect component of cough variant asthma Patient reports 10 years of cyclical coughing typically from winter to early spring She feels that this has worsened Has never had full pulmonary function testing  Plan: Continue Xyzal Continue Singulair We will order full pulmonary function testing Continue Advair 250 Continue rescue inhaler Continue to work on reducing BMI Continue Tessalon Perles Try to use over-the-counter cough medicines that are nonsedating such as Delsym or Mucinex Can continue to use Tussionex 5 mL in the evening to help with cough suppression at night, this medication is sedating, and is not intended to be used long-term [place] P[place] aware reviewed 33-month follow-up with Dr. Craige Cotta If cough continues to persist without improvement may need to consider repeat chest imaging   SOB (shortness of breath) Plan: Continue follow-up with cardiology Pulmonary function testing ordered today Proceed forward with cardiopulmonary exercise testing when able to complete    Return in about 2 months (around 01/21/2021), or if symptoms worsen or fail to improve, for Follow up with Dr. Craige Cotta, Follow up for FULL PFT - 60 min.   Coral Ceo, NP 11/20/2020   This appointment required 24 minutes of patient care (this includes precharting, chart review, review of results, face-to-face care, etc.).

## 2020-11-20 NOTE — Assessment & Plan Note (Signed)
Plan: Continue follow-up with cardiology Pulmonary function testing ordered today Proceed forward with cardiopulmonary exercise testing when able to complete

## 2020-11-20 NOTE — Patient Instructions (Addendum)
You were seen today by Lauraine Rinne, NP  for:   1. Cough  - chlorpheniramine-HYDROcodone (TUSSIONEX PENNKINETIC ER) 10-8 MG/5ML SUER; Take 5 mLs by mouth at bedtime as needed for cough.  Dispense: 115 mL; Refill: 0  We will also order a pulmonary function test to further evaluate your cough  Continue Advair  Cough Home Instructions:  We believe you have a chronic/cyclical cough that is aggravated by reflux , coughing , and drainage.  . Goal is to not Cough or clear throat.  Marland Kitchen Avoid coughing or clearing throat by using:  o non-mint products/sugarless candy o Water o ice chips o Salt water rinses twice daily o Remember NO MINT PRODUCTS  . Medications to use:  o Mucinex DM 1-2 every 12 hrs or Delsym 2 tsp every 12 hrs for cough (These are Over the counter) o Tessalon Three times a day  As needed  Cough.  o Xyzal daily  o Singulair daily  o Can use tussionex 65m as need at night for cough - this is sedating  o Chlor tabs 435m2 at bedtime  for nasal drip until cough is 100% cough free. (this medication is over the counter)    2. Allergic rhinitis, unspecified seasonality, unspecified trigger  Continue Xyzal daily  Continue Singulair  3. SOB (shortness of breath)  Continue follow-up with cardiology evaluation with cardiopulmonary exercise test next We will order a pulmonary function test   We recommend today:   Meds ordered this encounter  Medications  . chlorpheniramine-HYDROcodone (TUSSIONEX PENNKINETIC ER) 10-8 MG/5ML SUER    Sig: Take 5 mLs by mouth at bedtime as needed for cough.    Dispense:  115 mL    Refill:  0    Follow Up:    Return in about 2 months (around 01/21/2021), or if symptoms worsen or fail to improve, for Follow up with Dr. SoHalford ChessmanFollow up for FULL PFT - 60 min. Please schedule pulmonary function test and 60-minute time slot at next available, patient would prefer for this to be in calendar year 2021 is she has met her deductible if at all  possible  Notification of test results are managed in the following manner: If there are  any recommendations or changes to the  plan of care discussed in office today,  we will contact you and let you know what they are. If you do not hear from [facility] your results are normal and you can view them through your  MyChart account , or a letter will be sent to you. Thank you again for trusting [facility] your care  - Thank you, [facility]    It is flu season:   >>> Best ways to protect herself from the flu: Receive the yearly flu vaccine, practice good hand hygiene washing with soap and also using hand sanitizer when available, eat a nutritious meals, get adequate rest, hydrate appropriately       Please contact the office if your symptoms worsen or you have concerns that you are not improving.   Thank you for choosing [facility] for your healthcare, and for allowing [facility] partner with you on your healthcare journey. I am thankful to be able to provide care to you today.   BrWyn QuakerNP-C

## 2020-11-20 NOTE — Assessment & Plan Note (Signed)
Plan: Continue Singulair Continue Xyzal

## 2020-11-20 NOTE — Assessment & Plan Note (Signed)
Suspect component of cough variant asthma Patient reports 10 years of cyclical coughing typically from winter to early spring She feels that this has worsened Has never had full pulmonary function testing  Plan: Continue Xyzal Continue Singulair We will order full pulmonary function testing Continue Advair 250 Continue rescue inhaler Continue to work on reducing BMI Continue Tessalon Perles Try to use over-the-counter cough medicines that are nonsedating such as Delsym or Mucinex Can continue to use Tussionex 5 mL in the evening to help with cough suppression at night, this medication is sedating, and is not intended to be used long-term [place] PMP aware reviewed 49-month follow-up with Sherri Miller If cough continues to persist without improvement may need to consider repeat chest imaging

## 2020-12-10 ENCOUNTER — Telehealth (INDEPENDENT_AMBULATORY_CARE_PROVIDER_SITE_OTHER): Payer: 59 | Admitting: Psychiatry

## 2020-12-10 ENCOUNTER — Other Ambulatory Visit: Payer: Self-pay

## 2020-12-10 ENCOUNTER — Other Ambulatory Visit (HOSPITAL_COMMUNITY): Payer: Self-pay | Admitting: Psychiatry

## 2020-12-10 DIAGNOSIS — F900 Attention-deficit hyperactivity disorder, predominantly inattentive type: Secondary | ICD-10-CM

## 2020-12-10 DIAGNOSIS — F410 Panic disorder [episodic paroxysmal anxiety] without agoraphobia: Secondary | ICD-10-CM

## 2020-12-10 DIAGNOSIS — F411 Generalized anxiety disorder: Secondary | ICD-10-CM

## 2020-12-10 DIAGNOSIS — F3341 Major depressive disorder, recurrent, in partial remission: Secondary | ICD-10-CM | POA: Diagnosis not present

## 2020-12-10 MED ORDER — LISDEXAMFETAMINE DIMESYLATE 40 MG PO CAPS: 40.0000 mg | ORAL_CAPSULE | Freq: Every day | ORAL | 0 refills | Status: DC

## 2020-12-10 MED FILL — VYVANSE 40 MG CAPSULE: 40 | 30 days supply | Qty: 30 | Fill #0

## 2020-12-10 NOTE — Progress Notes (Signed)
BH MD/PA/NP OP Progress Note  12/10/2020 9:11 AM Sherri Miller  MRN:  [id number] Interview was conducted by phoen as patient could not connect to videoconferencing application and I verified that I was speaking with the correct person using two identifiers. I discussed the limitations of evaluation and management by telemedicine and  the availability of in person appointments. Patient expressed understanding and agreed to proceed. Participants in the visit: patient (location - home); physician (location - home office).  Chief Complaint: "I've been bouncing off the walls on this dose of Vyvanse".  HPI: 45 yo SWF with a hx of recurrent depression, anxiety and ADD. Shereportshigh level of anxiety (both generalized and panic attacks) as well as depression, racing thoughts, irritability, distractibility, forgetfulness, insomnia (initial and middle) ever since she developed pulmonary embolism after receiving COVID vaccine in February this year. 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 She was diagnosed with ADHD when she was in 5th grade but did not start taking medication for this until college years (was onIRmethylphenidate then taking it once daily). She has been tried on escitalopram (sexual side effects) and bupropion for depression and on alprazolam, clonazepam for anxiety (also briefly on diazepam 5 mg for muscle relaxation/anxiety in immediate aftermath of pulmonary embolism).We have increasedtrazodoneto 150mg  nightly for insomnia and she reports that it works very well. 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 Klonopin is helpful for anxiety attacks and she uses it very sparingly.Narelle reports being in counseling twice a month.We have added duloxetine last month, she is now on 60 mg, tolerates it well and reports clear improvement in mood (less depressed, more active).We added  Vyvanse 30 mg for ADD - minimally effective but well tolerated. When dose was increased to 50 mg she became hyperactive.   Visit Diagnosis:    ICD-10-CM   1. Attention deficit hyperactivity disorder (ADHD), predominantly inattentive type  F90.0   2. GAD (generalized anxiety disorder)  F41.1   3. Panic disorder  F41.0   4. Major depressive disorder, recurrent episode, in partial remission (HCC)  F33.41     Past Psychiatric History: Please see intake H&P.  Past Medical History:  Past Medical History:  Diagnosis Date  . Allergy   . Asthma   . Chicken pox   . Depression   . Migraine   . Migraines   . Pulmonary embolism (HCC)   . Pulmonary embolism (HCC) 01/2020   No past surgical history on file.  Family Psychiatric History: None.  Family History:  Family History  Adopted: Yes  Family history unknown: Yes    Social History:  Social History   Socioeconomic History  . Marital status: Single    Spouse name: Not on file  . Number of children: Not on file  . Years of education: Not on file  . Highest education level: Not on file  Occupational History  . Occupation: Nurse  Tobacco Use  . Smoking status: Never Smoker  . Smokeless tobacco: Never Used  Vaping Use  . Vaping Use: Never used  Substance and Sexual Activity  . Alcohol use: No    Alcohol/week: 0.0 standard drinks  . Drug use: No  . Sexual activity: Yes    Birth control/protection: Pill  Other Topics Concern  . Not on file  Social History Narrative   Works as a 02/2020.   Social Determinants of Health  Financial Resource Strain: Not on file  Food Insecurity: Not on file  Transportation Needs: Not on file  Physical Activity: Not on file  Stress: Not on file  Social Connections: Not on file    Allergies:  Allergies  Allergen Reactions  . Codeine Nausea And Vomiting  . Penicillins Hives    Did it involve swelling of the face/tongue/throat, SOB, or low BP?No Did it involve sudden or  severe rash/hives, skin peeling, or any reaction on the inside of your mouth or nose? Yes Did you need to seek medical attention at a hospital or doctor's office?No When did it last happen?Teenager If all above answers are "NO", may proceed with cephalosporin use.    Metabolic Disorder Labs: No results found for: HGBA1C, MPG No results found for: PROLACTIN No results found for: CHOL, TRIG, HDL, CHOLHDL, VLDL, LDLCALC No results found for: TSH  Therapeutic Level Labs: No results found for: LITHIUM No results found for: VALPROATE No components found for:  CBMZ  Current Medications: Current Outpatient Medications  Medication Sig Dispense Refill  . albuterol (VENTOLIN HFA) 108 (90 Base) MCG/ACT inhaler Inhale 2 puffs into the lungs every 6 (six) hours as needed for wheezing or shortness of breath. 8 g 6  . apixaban (ELIQUIS) 5 MG TABS tablet Take 1 tablet (5 mg total) by mouth 2 (two) times daily. 180 tablet 3  . apixaban (ELIQUIS) 5 MG TABS tablet Take by mouth.    . chlorpheniramine-HYDROcodone (TUSSIONEX PENNKINETIC ER) 10-8 MG/5ML SUER Take 5 mLs by mouth at bedtime as needed for cough. 115 mL 0  . clonazePAM (KLONOPIN) 0.5 MG tablet Take 1 tablet (0.5 mg total) by mouth 2 (two) times daily as needed for anxiety. 30 tablet 2  . DULoxetine (CYMBALTA) 60 MG capsule Take 1 capsule (60 mg total) by mouth daily. 90 capsule 1  . famotidine (PEPCID) 20 MG tablet Take 1 tablet (20 mg total) by mouth 2 (two) times daily. 180 tablet 3  . Fluticasone-Salmeterol (ADVAIR DISKUS) 250-50 MCG/DOSE AEPB Inhale 1 puff into the lungs in the morning and at bedtime. 60 each 5  . furosemide (LASIX) 20 MG tablet Take 1 tablet (20 mg total) by mouth daily as needed for edema. 30 tablet 1  . ibuprofen (MOTRIN IB) 200 MG tablet Take 4 tablets (800 mg total) by mouth 3 (three) times daily before meals. 100 tablet 2  . levocetirizine (XYZAL) 5 MG tablet Take 1 tablet (5 mg total) by mouth in the morning. 30  tablet 5  . lisdexamfetamine (VYVANSE) 40 MG capsule Take 1 capsule (40 mg total) by mouth daily. 30 capsule 0  . montelukast (SINGULAIR) 10 MG tablet Take 1 tablet (10 mg total) by mouth at bedtime. 30 tablet 11  . rizatriptan (MAXALT-MLT) 10 MG disintegrating tablet DISSOLVE 1 TABLET BY MOUTH AS NEEDED FOR MIGRAINE, MAY REPEAT IN 2 HOURS IF NEEDED. NO MORE THAN 30MG  IN 24 HOURS (Patient taking differently: Take 10 mg by mouth daily as needed for migraine.) 10 tablet 0  . traZODone (DESYREL) 150 MG tablet Take 1 tablet (150 mg total) by mouth at bedtime. Needs Office visit for additional refills 90 tablet 1   No current facility-administered medications for this visit.       Psychiatric Specialty Exam: Review of Systems  Psychiatric/Behavioral: The patient is nervous/anxious and is hyperactive.   All other systems reviewed and are negative.   There were no vitals taken for this visit.There is no height or weight on file  to calculate BMI.  General Appearance: NA  Eye Contact:  NA  Speech:  Clear and Coherent and Normal Rate  Volume:  Normal  Mood:  Less anxiou.  Affect:  NA  Thought Process:  Goal Directed  Orientation:  Full (Time, Place, and Person)  Thought Content: Logical   Suicidal Thoughts:  No  Homicidal Thoughts:  No  Memory:  Immediate;   Good Recent;   Good Remote;   Good  Judgement:  Good  Insight:  Good  Psychomotor Activity:  NA  Concentration:  Concentration: Fair  Recall:  Good  Fund of Knowledge: Good  Language: Good  Akathisia:  Negative  Handed:  Right  AIMS (if indicated): not done  Assets:  Communication Skills Desire for Improvement Financial Resources/Insurance Housing Talents/Skills Vocational/Educational  ADL's:  Intact  Cognition: WNL  Sleep:  Good   Screenings: GAD-7   Flowsheet Row Office Visit from 10/14/2019 in [facility] Visit from 10/28/2018 in [facility] Visit from  08/06/2018 in [facility]  Total GAD-7 Score 0 4 15    PHQ2-9   [facility] Visit from 10/14/2019 in [facility] Visit from 10/28/2018 in [facility] Visit from 08/06/2018 in [facility] Visit from 07/14/2015 in [facility] [place] from 03/29/2015 in [facility] Total Score 0 1 5 0 0  PHQ-9 Total Score 0 1 17 -- --       Assessment and Plan: 45 yo SWF with a hx of recurrent depression, anxiety and ADD. Shereportshigh level of anxiety (both generalized and panic attacks) as well as depression, racing thoughts, irritability, distractibility, forgetfulness, insomnia (initial and middle) ever since she developed pulmonary embolism after receiving COVID vaccine in February this year. She works in a high stress environment as a Banker. A week ago she agian tested positive for COVID and is staying home. She was diagnosed with ADHD when she was in 5th grade but did not start taking medication for this until college years (was onIRmethylphenidate then taking it once daily). She has been tried on escitalopram (sexual side effects) and bupropion for depression and on alprazolam, clonazepam for anxiety (also briefly on diazepam 5 mg for muscle relaxation/anxiety in immediate aftermath of pulmonary embolism).We have increasedtrazodoneto 150mg  nightly for insomnia and she reports that it works very well. 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 Klonopin is helpful for anxiety attacks and she uses it very sparingly.Kelci reports being in counseling twice a month.We have added duloxetine last month, she is now on 60 mg, tolerates it well and reports clear improvement in mood (less depressed, more active).We added Vyvanse 30 mg for ADD - minimally effective but well tolerated. When dose was increased to 50 mg she became  hyperactive.  Dx: ADHD inattentive; Panic disorder; GAD; MDD recurrent in partial remission  Plan: We will continueduloxetine 60 mg,trazodone 150 mg at HSandclonazepam 0.5 mg bid prn panic anxiety. We will decrease Vyvanse dose to 40 mg.It it is either poorly tolerated or not helpful we will try Adderall XR next. We willmeet again in4weeks.The plan was discussed with patient who had an opportunity to ask questions and these were all answered. I spend18minutes in phone consultation with the patient.   Stephanie Acre, MD 12/10/2020, 9:11 AM

## 2020-12-25 ENCOUNTER — Other Ambulatory Visit: Payer: Self-pay | Admitting: Internal Medicine

## 2020-12-25 ENCOUNTER — Telehealth: Payer: Self-pay | Admitting: Internal Medicine

## 2020-12-25 DIAGNOSIS — R059 Cough, unspecified: Secondary | ICD-10-CM

## 2020-12-25 MED ORDER — HYDROCOD POLST-CPM POLST ER 10-8 MG/5ML PO SUER: 5.0000 mL | Freq: Every evening | ORAL | 0 refills | Status: DC | PRN

## 2020-12-25 MED ORDER — PREDNISONE 20 MG PO TABS: 40.0000 mg | ORAL_TABLET | Freq: Every day | ORAL | 0 refills | Status: DC

## 2020-12-25 MED FILL — predniSONE 20 MG TABS: 20 | 7 days supply | Qty: 14 | Fill #0

## 2020-12-25 MED FILL — HYDROCODONE-CHLORPHEN ER SU: 10-8 | 23 days supply | Qty: 115 | Fill #0

## 2020-12-31 MED FILL — RIZATRIPTAN BENZOATE 10 MG: 10 | 30 days supply | Qty: 12 | Fill #6

## 2021-01-01 ENCOUNTER — Other Ambulatory Visit: Payer: Self-pay | Admitting: Internal Medicine

## 2021-01-02 ENCOUNTER — Other Ambulatory Visit: Payer: Self-pay | Admitting: Pulmonary Disease

## 2021-01-02 MED FILL — MONTELUKAST SOD 10 MG TAB: 10 | 90 days supply | Qty: 90 | Fill #0

## 2021-01-07 MED FILL — NORETHINDRONE 0.35 MG TABS: 0.35 | 84 days supply | Qty: 84 | Fill #1

## 2021-01-08 ENCOUNTER — Other Ambulatory Visit: Payer: Self-pay | Admitting: Internal Medicine

## 2021-01-08 ENCOUNTER — Telehealth (INDEPENDENT_AMBULATORY_CARE_PROVIDER_SITE_OTHER): Payer: 59 | Admitting: Psychiatry

## 2021-01-08 ENCOUNTER — Other Ambulatory Visit: Payer: Self-pay

## 2021-01-08 DIAGNOSIS — F410 Panic disorder [episodic paroxysmal anxiety] without agoraphobia: Secondary | ICD-10-CM

## 2021-01-08 DIAGNOSIS — F900 Attention-deficit hyperactivity disorder, predominantly inattentive type: Secondary | ICD-10-CM | POA: Diagnosis not present

## 2021-01-08 DIAGNOSIS — F3341 Major depressive disorder, recurrent, in partial remission: Secondary | ICD-10-CM

## 2021-01-08 DIAGNOSIS — F428 Other obsessive-compulsive disorder: Secondary | ICD-10-CM

## 2021-01-08 DIAGNOSIS — F429 Obsessive-compulsive disorder, unspecified: Secondary | ICD-10-CM | POA: Insufficient documentation

## 2021-01-08 DIAGNOSIS — F411 Generalized anxiety disorder: Secondary | ICD-10-CM

## 2021-01-08 MED ORDER — LISDEXAMFETAMINE DIMESYLATE 40 MG PO CAPS: 40.0000 mg | ORAL_CAPSULE | Freq: Every day | ORAL | 0 refills | Status: DC

## 2021-01-08 MED ORDER — CLONAZEPAM 0.5 MG PO TABS: 0.5000 mg | ORAL_TABLET | Freq: Two times a day (BID) | ORAL | 2 refills | Status: DC | PRN

## 2021-01-08 MED ORDER — TRAZODONE HCL 150 MG PO TABS: 150.0000 mg | ORAL_TABLET | Freq: Every evening | ORAL | 1 refills | Status: DC | PRN

## 2021-01-08 MED ORDER — DULOXETINE HCL 60 MG PO CPEP: 120.0000 mg | ORAL_CAPSULE | Freq: Every day | ORAL | 3 refills | Status: DC

## 2021-01-08 MED FILL — LEVOCETIRIZINE 5 MG TABLET: 5 | 30 days supply | Qty: 30 | Fill #1

## 2021-01-08 MED FILL — VYVANSE 40 MG CAPSULE: 40 | 30 days supply | Qty: 30 | Fill #0

## 2021-01-08 MED FILL — clonazePAM 0.5 MG TABS: 0.5 | 15 days supply | Qty: 30 | Fill #0

## 2021-01-08 MED FILL — FAMOTIDINE 20 MG TABLET: 20 | 90 days supply | Qty: 180 | Fill #0

## 2021-01-08 NOTE — Progress Notes (Signed)
BH MD/PA/NP OP Progress Note  01/08/2021 9:16 AM [name]  MRN:  [id number] Interview was conducted by phone and I verified that I was speaking with the correct person using two identifiers. I discussed the limitations of evaluation and management by telemedicine and  the availability of in person appointments. Patient expressed understanding and agreed to proceed. Participants in the visit: patient (location - home); physician (location - home office).  Chief Complaint: "I seem to be obsessing a lot".  HPI: 45 yo SWF with a hx of recurrent depression, anxiety and ADD. Shereportshigh level of anxiety (both generalized and panic attacks) as well as depression, racing thoughts, irritability, distractibility, forgetfulness, insomnia (initial and middle) ever since she developed pulmonary embolism after receiving COVID vaccine in February this year. 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 She was diagnosed with ADHD when she was in 5th grade but did not start taking medication for this until college years (was onIRmethylphenidate then taking it once daily). She has been tried on escitalopram (sexual side effects) and bupropion for depression and on alprazolam, clonazepam for anxiety (also briefly on diazepam 5 mg for muscle relaxation/anxiety in immediate aftermath of pulmonary embolism).We have increasedtrazodoneto 150mg  nightly for insomnia and she reports that it works very well. 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 Klonopin is helpful for anxiety attacks and she uses it very sparingly.Shaneal reports being in counseling twice a month.We have added duloxetine last month, she is now on 60 mg, tolerates it well and reports clear improvement in mood (less depressed, more active).We added Vyvanse 30 mg for ADD - minimally effective but well tolerated. When dose was increased to 50  mg she became hyperactive. This month she has been on 40  g and it is well tolerated and helpful. Alissandra recently moved and noticed that she gets preoccupied with everything needed to be in place/order otherwise she gets upset/anxious. She might have had some of these before but it has become "an issue" since her move.    Visit Diagnosis:    ICD-10-CM   1. Other obsessive-compulsive disorders  F42.8   2. Attention deficit hyperactivity disorder (ADHD), predominantly inattentive type  F90.0   3. GAD (generalized anxiety disorder)  F41.1   4. Major depressive disorder, recurrent episode, in partial remission (HCC)  F33.41   5. Panic disorder  F41.0     Past Psychiatric History: Please see intake H&P.  Past Medical History:  Past Medical History:  Diagnosis Date  . Allergy   . Asthma   . Chicken pox   . Depression   . Migraine   . Migraines   . Pulmonary embolism (HCC)   . Pulmonary embolism (HCC) 01/2020   No past surgical history on file.  Family Psychiatric History: None  Family History:  Family History  Adopted: Yes  Family history unknown: Yes    Social History:  Social History   Socioeconomic History  . Marital status: Single    Spouse name: Not on file  . Number of children: Not on file  . Years of education: Not on file  . Highest education level: Not on file  Occupational History  . Occupation: Nurse  Tobacco Use  . Smoking status: Never Smoker  . Smokeless tobacco: Never Used  Vaping Use  . Vaping Use: Never used  Substance and Sexual Activity  . Alcohol use: No  Alcohol/week: 0.0 standard drinks  . Drug use: No  . Sexual activity: Yes    Birth control/protection: Pill  Other Topics Concern  . Not on file  Social History Narrative   Works as a Copy.   Social Determinants of Health   Financial Resource Strain: Not on file  Food Insecurity: Not on file  Transportation Needs: Not on file  Physical Activity: Not on file   Stress: Not on file  Social Connections: Not on file    Allergies:  Allergies  Allergen Reactions  . Codeine Nausea And Vomiting  . Penicillins Hives    Did it involve swelling of the face/tongue/throat, SOB, or low BP?No Did it involve sudden or severe rash/hives, skin peeling, or any reaction on the inside of your mouth or nose? Yes Did you need to seek medical attention at a hospital or doctor's office?No When did it last happen?Teenager If all above answers are "NO", may proceed with cephalosporin use.    Metabolic Disorder Labs: No results found for: HGBA1C, MPG No results found for: PROLACTIN No results found for: CHOL, TRIG, HDL, CHOLHDL, VLDL, LDLCALC No results found for: TSH  Therapeutic Level Labs: No results found for: LITHIUM No results found for: VALPROATE No components found for:  CBMZ  Current Medications: Current Outpatient Medications  Medication Sig Dispense Refill  . DULoxetine (CYMBALTA) 60 MG capsule Take 2 capsules (120 mg total) by mouth daily. 180 capsule 3  . lisdexamfetamine (VYVANSE) 40 MG capsule Take 1 capsule (40 mg total) by mouth daily before breakfast. 30 capsule 0  . [START ON 02/07/2021] lisdexamfetamine (VYVANSE) 40 MG capsule Take 1 capsule (40 mg total) by mouth daily before breakfast. 30 capsule 0  . [START ON 03/09/2021] lisdexamfetamine (VYVANSE) 40 MG capsule Take 1 capsule (40 mg total) by mouth daily before breakfast. 30 capsule 0  . albuterol (VENTOLIN HFA) 108 (90 Base) MCG/ACT inhaler Inhale 2 puffs into the lungs every 6 (six) hours as needed for wheezing or shortness of breath. 8 g 6  . apixaban (ELIQUIS) 5 MG TABS tablet Take 1 tablet (5 mg total) by mouth 2 (two) times daily. 180 tablet 3  . apixaban (ELIQUIS) 5 MG TABS tablet Take by mouth.    . chlorpheniramine-HYDROcodone (TUSSIONEX PENNKINETIC ER) 10-8 MG/5ML SUER Take 5 mLs by mouth at bedtime as needed for cough. 115 mL 0  . clonazePAM (KLONOPIN) 0.5 MG tablet Take 1  tablet (0.5 mg total) by mouth 2 (two) times daily as needed for anxiety. 30 tablet 2  . famotidine (PEPCID) 20 MG tablet Take 1 tablet (20 mg total) by mouth 2 (two) times daily. 180 tablet 3  . Fluticasone-Salmeterol (ADVAIR DISKUS) 250-50 MCG/DOSE AEPB Inhale 1 puff into the lungs in the morning and at bedtime. 60 each 5  . furosemide (LASIX) 20 MG tablet Take 1 tablet (20 mg total) by mouth daily as needed for edema. 30 tablet 1  . ibuprofen (MOTRIN IB) 200 MG tablet Take 4 tablets (800 mg total) by mouth 3 (three) times daily before meals. 100 tablet 2  . levocetirizine (XYZAL) 5 MG tablet Take 1 tablet (5 mg total) by mouth in the morning. 30 tablet 5  . montelukast (SINGULAIR) 10 MG tablet TAKE 1 TABLET BY MOUTH AT BEDTIME 90 tablet 11  . rizatriptan (MAXALT-MLT) 10 MG disintegrating tablet DISSOLVE 1 TABLET BY MOUTH AS NEEDED FOR MIGRAINE, MAY REPEAT IN 2 HOURS IF NEEDED. NO MORE THAN 30MG  IN 24 HOURS (Patient taking  differently: Take 10 mg by mouth daily as needed for migraine.) 10 tablet 0  . traZODone (DESYREL) 150 MG tablet Take 1 tablet (150 mg total) by mouth at bedtime. Needs Office visit for additional refills 90 tablet 1   No current facility-administered medications for this visit.     Psychiatric Specialty Exam: Review of Systems  Psychiatric/Behavioral: The patient is nervous/anxious.   All other systems reviewed and are negative.   There were no vitals taken for this visit.There is no height or weight on file to calculate BMI.  General Appearance: NA  Eye Contact:  NA  Speech:  Clear and Coherent and Normal Rate  Volume:  Normal  Mood:  Anxious  Affect:  NA  Thought Process:  Goal Directed  Orientation:  Full (Time, Place, and Person)  Thought Content: Logical   Suicidal Thoughts:  No  Homicidal Thoughts:  No  Memory:  Immediate;   Good Recent;   Good Remote;   Good  Judgement:  Good  Insight:  Good  Psychomotor Activity:  NA  Concentration:  Concentration:  Good  Recall:  Good  Fund of Knowledge: Good  Language: Good  Akathisia:  Negative  Handed:  Right  AIMS (if indicated): not done  Assets:  Communication Skills Desire for Improvement Financial Resources/Insurance Housing Social Support Vocational/Educational  ADL's:  Intact  Cognition: WNL  Sleep:  Good   Screenings: GAD-7   Flowsheet Row Office Visit from 10/14/2019 in [facility] Office Visit from 10/28/2018 in [facility] Office Visit from 08/06/2018 in [facility]  Total GAD-7 Score 0 4 15    PHQ2-9   Flowsheet Row Office Visit from 10/14/2019 in [facility] Office Visit from 10/28/2018 in [facility] Office Visit from 08/06/2018 in [facility] Office Visit from 07/14/2015 in [facility] [place] Office Visit from [facility]/29/2015 [facility] Total Score 0 1 5 0 0  PHQ-9 Total Score 0 1 17 - -       Assessment and Plan: 45 yo SWF with a hx of recurrent depression, anxiety and ADD. Shereportshigh level of anxiety (both generalized and panic attacks) as well as depression, racing thoughts, irritability, distractibility, forgetfulness, insomnia (initial and middle) ever since she developed pulmonary embolism after receiving COVID vaccine in February this year. 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 She was diagnosed with ADHD when she was in 5th grade but did not start taking medication for this until college years (was onIRmethylphenidate then taking it once daily). She has been tried on escitalopram (sexual side effects) and bupropion for depression and on alprazolam, clonazepam for anxiety (also briefly on diazepam 5 mg for muscle relaxation/anxiety in immediate aftermath of pulmonary embolism).We have increasedtrazodoneto 150mg  nightly for  insomnia and she reports that it works very well. 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 Klonopin is helpful for anxiety attacks and she uses it very sparingly.Zeyna reports being in counseling twice a month.We have added duloxetine last month, she is now on 60 mg, tolerates it well and reports clear improvement in mood (less depressed, more active).We added Vyvanse 30 mg for ADD - minimally effective but well tolerated. When dose was increased to 50 mg she became hyperactive. This month she has been on 40  g and it is well tolerated  and helpful. Sherri Miller recently moved and noticed that she gets preoccupied with everything needed to be in place/order otherwise she gets upset/anxious. She might have had some of these before but it has become "an issue" since her move.  Dx: ADHD inattentive; OCD; Panic disorder; GAD; MDD recurrent in partial remission  Plan: We will continueduloxetine but increase dose to 120 mg for OCD symptoms,trazodone 150 mg at HS, clonazepam 0.5 mg bid prn panic anxiety and Vyvanse 40 mg.We willmeet again in6weeks.The plan was discussed with patient who had an opportunity to ask questions and these were all answered. I spend72minutes in phone consultation with the patient.   Magdalene Patricia, MD 01/08/2021, 9:16 AM

## 2021-01-30 ENCOUNTER — Ambulatory Visit: Payer: 59 | Admitting: Pulmonary Disease

## 2021-01-31 ENCOUNTER — Other Ambulatory Visit (HOSPITAL_COMMUNITY): Payer: Self-pay | Admitting: *Deleted

## 2021-01-31 NOTE — Progress Notes (Signed)
ekg per Dr Shirlee Latch

## 2021-02-13 MED FILL — LEVOCETIRIZINE 5 MG TABLET: 5 | 30 days supply | Qty: 30 | Fill #2

## 2021-02-13 MED FILL — ELIQUIS 5 MG TABLET: 5 | 90 days supply | Qty: 180 | Fill #3

## 2021-02-13 MED FILL — clonazePAM 0.5 MG TABS: 0.5 | 15 days supply | Qty: 30 | Fill #1

## 2021-02-13 MED FILL — traZODone HCL 150 MG TABS: 150 | 90 days supply | Qty: 90 | Fill #1

## 2021-02-19 ENCOUNTER — Telehealth (INDEPENDENT_AMBULATORY_CARE_PROVIDER_SITE_OTHER): Payer: 59 | Admitting: Psychiatry

## 2021-02-19 ENCOUNTER — Other Ambulatory Visit (HOSPITAL_COMMUNITY): Payer: Self-pay | Admitting: Psychiatry

## 2021-02-19 ENCOUNTER — Other Ambulatory Visit: Payer: Self-pay

## 2021-02-19 DIAGNOSIS — F3341 Major depressive disorder, recurrent, in partial remission: Secondary | ICD-10-CM

## 2021-02-19 DIAGNOSIS — F410 Panic disorder [episodic paroxysmal anxiety] without agoraphobia: Secondary | ICD-10-CM

## 2021-02-19 DIAGNOSIS — F411 Generalized anxiety disorder: Secondary | ICD-10-CM | POA: Diagnosis not present

## 2021-02-19 DIAGNOSIS — F900 Attention-deficit hyperactivity disorder, predominantly inattentive type: Secondary | ICD-10-CM | POA: Diagnosis not present

## 2021-02-19 MED ORDER — LISDEXAMFETAMINE DIMESYLATE 40 MG PO CAPS: 40.0000 mg | ORAL_CAPSULE | Freq: Every day | ORAL | 0 refills | Status: DC

## 2021-02-19 NOTE — Progress Notes (Signed)
BH MD/PA/NP OP Progress Note  02/19/2021 10:12 AM HAZLEY DEZEEUW  MRN:  [id number] Interview was conducted using videoconferencing application and I verified that I was speaking with the correct person using two identifiers. I discussed the limitations of evaluation and management by telemedicine and  the availability of in person appointments. Patient expressed understanding and agreed to proceed. Participants in the visit: patient (location - home); physician (location - home office).  Chief Complaint: Less anxious.  HPI: 45 yo SWF with a hx of recurrent depression, anxiety and ADD. Shereportshigh level of anxiety (both generalized and panic attacks) as well as depression, racing thoughts, irritability, distractibility, forgetfulness, insomnia (initial and middle) ever since she developed pulmonary embolism after receiving COVID vaccine in February this year. She works in a high stress environment as a Copy.A week ago she agian tested positive for COVID and is staying home.She was diagnosed with ADHD when she was in 5th grade but did not start taking medication for this until college years (was onIRmethylphenidate then taking it once daily). She has been tried on escitalopram (sexual side effects) and bupropion for depression and on alprazolam, clonazepam for anxiety (also briefly on diazepam 5 mg for muscle relaxation/anxiety in immediate aftermath of pulmonary embolism).We have increasedtrazodoneto 150mg  nightly for insomnia and she reports that it works very well. 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 Klonopin is helpful for anxiety attacks and she uses it very sparingly.Anwyn reports being in counseling twice a month.We have added duloxetine last month, she is now on 60 mg, tolerates it well and reports clear improvement in mood (less depressed, more active).We added Vyvanse 30 mg for ADD - minimally effective but well tolerated.When dose was  increased to 50 mg she became hyperactive. This month she has been on 40  mg and it is well tolerated and helpful. Recently diagnosed with hearing loss - wears hearing aid now.    Visit Diagnosis:    ICD-10-CM   1. Attention deficit hyperactivity disorder (ADHD), predominantly inattentive type  F90.0   2. GAD (generalized anxiety disorder)  F41.1   3. Major depressive disorder, recurrent episode, in partial remission (HCC)  F33.41   4. Panic disorder  F41.0     Past Psychiatric History: Please see intake H&P.  Past Medical History:  Past Medical History:  Diagnosis Date  . Allergy   . Asthma   . Chicken pox   . Depression   . Migraine   . Migraines   . Pulmonary embolism (HCC)   . Pulmonary embolism (HCC) 01/2020   No past surgical history on file.  Family Psychiatric History: None.  Family History:  Family History  Adopted: Yes  Family history unknown: Yes    Social History:  Social History   Socioeconomic History  . Marital status: Single    Spouse name: Not on file  . Number of children: Not on file  . Years of education: Not on file  . Highest education level: Not on file  Occupational History  . Occupation: Nurse  Tobacco Use  . Smoking status: Never Smoker  . Smokeless tobacco: Never Used  Vaping Use  . Vaping Use: Never used  Substance and Sexual Activity  . Alcohol use: No    Alcohol/week: 0.0 standard drinks  . Drug use: No  . Sexual activity: Yes    Birth control/protection: Pill  Other Topics Concern  . Not on file  Social History Narrative   Works as a critical  care nurse.   Social Determinants of Health   Financial Resource Strain: Not on file  Food Insecurity: Not on file  Transportation Needs: Not on file  Physical Activity: Not on file  Stress: Not on file  Social Connections: Not on file    Allergies:  Allergies  Allergen Reactions  . Codeine Nausea And Vomiting  . Penicillins Hives    Did it involve swelling of the  face/tongue/throat, SOB, or low BP?No Did it involve sudden or severe rash/hives, skin peeling, or any reaction on the inside of your mouth or nose? Yes Did you need to seek medical attention at a hospital or doctor's office?No When did it last happen?Teenager If all above answers are "NO", may proceed with cephalosporin use.    Metabolic Disorder Labs: No results found for: HGBA1C, MPG No results found for: PROLACTIN No results found for: CHOL, TRIG, HDL, CHOLHDL, VLDL, LDLCALC No results found for: TSH  Therapeutic Level Labs: No results found for: LITHIUM No results found for: VALPROATE No components found for:  CBMZ  Current Medications: Current Outpatient Medications  Medication Sig Dispense Refill  . lisdexamfetamine (VYVANSE) 40 MG capsule Take 1 capsule (40 mg total) by mouth daily before breakfast. 30 capsule 0  . [START ON 03/21/2021] lisdexamfetamine (VYVANSE) 40 MG capsule Take 1 capsule (40 mg total) by mouth daily before breakfast. 30 capsule 0  . [START ON 04/20/2021] lisdexamfetamine (VYVANSE) 40 MG capsule Take 1 capsule (40 mg total) by mouth daily before breakfast. 30 capsule 0  . albuterol (VENTOLIN HFA) 108 (90 Base) MCG/ACT inhaler Inhale 2 puffs into the lungs every 6 (six) hours as needed for wheezing or shortness of breath. 8 g 6  . apixaban (ELIQUIS) 5 MG TABS tablet Take 1 tablet (5 mg total) by mouth 2 (two) times daily. 180 tablet 3  . apixaban (ELIQUIS) 5 MG TABS tablet Take by mouth.    . chlorpheniramine-HYDROcodone (TUSSIONEX PENNKINETIC ER) 10-8 MG/5ML SUER Take 5 mLs by mouth at bedtime as needed for cough. 115 mL 0  . clonazePAM (KLONOPIN) 0.5 MG tablet Take 1 tablet (0.5 mg total) by mouth 2 (two) times daily as needed for anxiety. 30 tablet 2  . DULoxetine (CYMBALTA) 60 MG capsule Take 2 capsules (120 mg total) by mouth daily. 180 capsule 3  . famotidine (PEPCID) 20 MG tablet TAKE 1 TABLET BY MOUTH TWICE DAILY 180 tablet 11  .  Fluticasone-Salmeterol (ADVAIR DISKUS) 250-50 MCG/DOSE AEPB Inhale 1 puff into the lungs in the morning and at bedtime. 60 each 5  . furosemide (LASIX) 20 MG tablet Take 1 tablet (20 mg total) by mouth daily as needed for edema. 30 tablet 1  . levocetirizine (XYZAL) 5 MG tablet Take 1 tablet (5 mg total) by mouth in the morning. 30 tablet 5  . montelukast (SINGULAIR) 10 MG tablet TAKE 1 TABLET BY MOUTH AT BEDTIME 90 tablet 11  . rizatriptan (MAXALT-MLT) 10 MG disintegrating tablet DISSOLVE 1 TABLET BY MOUTH AS NEEDED FOR MIGRAINE, MAY REPEAT IN 2 HOURS IF NEEDED. NO MORE THAN 30MG  IN 24 HOURS (Patient taking differently: Take 10 mg by mouth daily as needed for migraine.) 10 tablet 0  . traZODone (DESYREL) 150 MG tablet Take 1 tablet (150 mg total) by mouth at bedtime as needed for sleep. 90 tablet 1   No current facility-administered medications for this visit.      Psychiatric Specialty Exam: Review of Systems  Psychiatric/Behavioral: The patient is nervous/anxious.   All other systems  reviewed and are negative.   There were no vitals taken for this visit.There is no height or weight on file to calculate BMI.  General Appearance: Casual  Eye Contact:  Good  Speech:  Clear and Coherent and Normal Rate  Volume:  Normal  Mood:  Anxious  Affect:  Full Range  Thought Process:  Goal Directed  Orientation:  Full (Time, Place, and Person)  Thought Content: Logical   Suicidal Thoughts:  No  Homicidal Thoughts:  No  Memory:  Immediate;   Good Recent;   Good Remote;   Good  Judgement:  Good  Insight:  Good  Psychomotor Activity:  Normal  Concentration:  Concentration: Good  Recall:  Good  Fund of Knowledge: Good  Language: Good  Akathisia:  Negative  Handed:  Right  AIMS (if indicated): not done  Assets:  Communication Skills Desire for Improvement Financial Resources/Insurance Housing Resilience Vocational/Educational  ADL's:  Intact  Cognition: WNL  Sleep:  Fair    Screenings: GAD-7   Flowsheet Row Office Visit from 10/14/2019 in [facility] Office Visit from 10/28/2018 in [facility] Office Visit from 08/06/2018 in [facility]  Total GAD-7 Score 0 4 15    PHQ2-9   Flowsheet Row Office Visit from 10/14/2019 in [facility] Office Visit from 10/28/2018 in [facility] Office Visit from 08/06/2018 in [facility] Office Visit from 07/14/2015 in [facility] at [place] Office Visit from 03/29/2015 in [facility] Care at [facility] Total Score 0 1 5 0 0  PHQ-9 Total Score 0 1 17 - -       Assessment and Plan: 45 yo SWF with a hx of recurrent depression, anxiety and ADD. Shereportshigh level of anxiety (both generalized and panic attacks) as well as depression, racing thoughts, irritability, distractibility, forgetfulness, insomnia (initial and middle) ever since she developed pulmonary embolism after receiving COVID vaccine in February this year. She works in a high stress environment as a Copy.A week ago she agian tested positive for COVID and is staying home.She was diagnosed with ADHD when she was in 5th grade but did not start taking medication for this until college years (was onIRmethylphenidate then taking it once daily). She has been tried on escitalopram (sexual side effects) and bupropion for depression and on alprazolam, clonazepam for anxiety (also briefly on diazepam 5 mg for muscle relaxation/anxiety in immediate aftermath of pulmonary embolism).We have increasedtrazodoneto 150mg  nightly for insomnia and she reports that it works very well. 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 Klonopin is helpful for anxiety attacks and she uses it very sparingly.Nashae reports being in counseling twice a month.We have added duloxetine last month, she is now on 60 mg, tolerates it well and  reports clear improvement in mood (less depressed, more active).We added Vyvanse 30 mg for ADD - minimally effective but well tolerated.When dose was increased to 50 mg she became hyperactive. This month she has been on 40  mg and it is well tolerated and helpful. Recently diagnosed with hearing loss - wears hearing aid now.  Joni Reining inattentive;Panic disorder; GAD; MDD recurrentin partial remission  Plan: We will continueduloxetine 60 mg,trazodone 150 mg at HS, clonazepam 0.5 mg bid prn panic anxiety and Vyvanse 40 mg.Next appointment in 2 months with a new provider. The plan was discussed with patient who had an opportunity to ask questions and these were all answered. I spend39minutes invideo visitwith  the patient.    Magdalene Patricia, MD 02/19/2021, 10:12 AM

## 2021-02-26 ENCOUNTER — Other Ambulatory Visit (HOSPITAL_BASED_OUTPATIENT_CLINIC_OR_DEPARTMENT_OTHER): Payer: Self-pay

## 2021-03-01 ENCOUNTER — Other Ambulatory Visit (HOSPITAL_BASED_OUTPATIENT_CLINIC_OR_DEPARTMENT_OTHER): Payer: Self-pay

## 2021-03-14 ENCOUNTER — Other Ambulatory Visit (HOSPITAL_COMMUNITY): Payer: Self-pay

## 2021-03-15 ENCOUNTER — Other Ambulatory Visit (HOSPITAL_COMMUNITY): Payer: Self-pay

## 2021-03-15 MED FILL — Lisdexamfetamine Dimesylate Cap 40 MG: ORAL | 30 days supply | Qty: 30 | Fill #0 | Status: CN

## 2021-03-15 MED FILL — Norethindrone Tab 0.35 MG: ORAL | 84 days supply | Qty: 84 | Fill #0 | Status: AC

## 2021-03-15 MED FILL — Levocetirizine Dihydrochloride Tab 5 MG: ORAL | 30 days supply | Qty: 30 | Fill #0 | Status: CN

## 2021-03-22 ENCOUNTER — Other Ambulatory Visit (HOSPITAL_COMMUNITY): Payer: Self-pay

## 2021-03-25 ENCOUNTER — Other Ambulatory Visit (HOSPITAL_COMMUNITY): Payer: Self-pay

## 2021-04-10 ENCOUNTER — Telehealth (INDEPENDENT_AMBULATORY_CARE_PROVIDER_SITE_OTHER): Payer: 59 | Admitting: Psychiatry

## 2021-04-10 ENCOUNTER — Other Ambulatory Visit: Payer: Self-pay

## 2021-04-10 DIAGNOSIS — F3341 Major depressive disorder, recurrent, in partial remission: Secondary | ICD-10-CM

## 2021-04-10 DIAGNOSIS — F411 Generalized anxiety disorder: Secondary | ICD-10-CM | POA: Diagnosis not present

## 2021-04-10 NOTE — Progress Notes (Signed)
BH MD/PA/NP OP Progress Note  04/10/2021 10:35 AM Sherri Altesicole L Miller  MRN:  [id number] Interview was conducted using videoconferencing application and I verified that I was speaking with the correct person using two identifiers. I discussed the limitations of evaluation and management by telemedicine and  the availability of in person appointments. Patient expressed understanding and agreed to proceed. Participants in the visit: patient (location - home); physician (location - home office).  Chief Complaint:  increased anxiety  HPI: Patient is 45 yo SWF with a hx of recurrent depression, anxiety and ADD. She was previously a patient of Dr.Pucilowska who is no longer with [facility]. She reports some increased anxiety, work continues to be busy. She is frustrated at her new home improvements since things are not happening on time. Otherwise doing well. Denies mood symptoms.Recovering from the health events of past few months. [place] She is compliant with her medications, states she takes the klonopin once in a while and mostly forgets when she actually needs it. Difficulty falling sleep and fair appetite. Denies any suicidal thoughts.  Per Dr.Pucilowska's last visit with patient, "She reports high level of anxiety (both generalized and panic attacks) as well as depression, racing thoughts, irritability, distractibility, forgetfulness, insomnia (initial and middle) ever since she developed pulmonary embolism after receiving COVID vaccine in February this year. 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 A week ago she agian tested positive for COVID and is staying home. She was diagnosed with ADHD when she was in 5th grade but did not start taking medication for this until college years (was on IR methylphenidate then taking it once daily).  She has been tried on escitalopram (sexual side effects) and bupropion for depression and on alprazolam, clonazepam for  anxiety (also briefly on diazepam 5 mg for muscle relaxation/anxiety in immediate aftermath of pulmonary embolism). We have increased trazodone to 150 mg nightly for insomnia and she reports that it works very well. 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 Klonopin is helpful for anxiety attacks and she uses it very sparingly. Sherri Miller reports being in counseling twice a month. We have added duloxetine last month, she is now on 60 mg, tolerates it well and reports clear improvement in mood (less depressed, more active). We added Vyvanse 30 mg for ADD - minimally effective but well tolerated. When dose was increased to 50 mg she became hyperactive. This month she has been on 40  mg and it is well tolerated and helpful. Recently diagnosed with hearing loss - wears hearing aid now.      Visit Diagnosis:    ICD-10-CM   1. Major depressive disorder, recurrent episode, in partial remission (HCC)  F33.41   2. GAD (generalized anxiety disorder)  F41.1     Past Psychiatric History: Please see intake H&P.  Past Medical History:  Past Medical History:  Diagnosis Date   Allergy    Asthma    Chicken pox    Depression    Migraine    Migraines    Pulmonary embolism (HCC)    Pulmonary embolism (HCC) 01/2020   No past surgical history on file.  Family Psychiatric History: None.  Family History:  Family History  Adopted: Yes  Family history unknown: Yes    Social History:  Social History   Socioeconomic History   Marital status: Single    Spouse name: Not on file   Number of children: Not  on file   Years of education: Not on file   Highest education level: Not on file  Occupational History   Occupation: Nurse  Tobacco Use   Smoking status: Never Smoker   Smokeless tobacco: Never Used  Vaping Use   Vaping Use: Never used  Substance and Sexual Activity   Alcohol use: No    Alcohol/week: 0.0 standard drinks   Drug use: No   Sexual activity: Yes    Birth  control/protection: Pill  Other Topics Concern   Not on file  Social History Narrative   Works as a Copy.   Social Determinants of Health   Financial Resource Strain: Not on file  Food Insecurity: Not on file  Transportation Needs: Not on file  Physical Activity: Not on file  Stress: Not on file  Social Connections: Not on file    Allergies:  Allergies  Allergen Reactions   Codeine Nausea And Vomiting   Penicillins Hives    Did it involve swelling of the face/tongue/throat, SOB, or low BP?No Did it involve sudden or severe rash/hives, skin peeling, or any reaction on the inside of your mouth or nose? Yes Did you need to seek medical attention at a hospital or doctor's office?No When did it last happen? Teenager    If all above answers are "NO", may proceed with cephalosporin use.    Metabolic Disorder Labs: No results found for: HGBA1C, MPG No results found for: PROLACTIN No results found for: CHOL, TRIG, HDL, CHOLHDL, VLDL, LDLCALC No results found for: TSH  Therapeutic Level Labs: No results found for: LITHIUM No results found for: VALPROATE No components found for:  CBMZ  Current Medications: Current Outpatient Medications  Medication Sig Dispense Refill   albuterol (VENTOLIN HFA) 108 (90 Base) MCG/ACT inhaler INHALE 2 PUFFS INTO THE LUNGS EVERY 6 HOURS AS NEEDED FOR WHEEZING OR SHORTNESS OF BREATH. 18 g 6   apixaban (ELIQUIS) 5 MG TABS tablet Take by mouth.     apixaban (ELIQUIS) 5 MG TABS tablet TAKE 1 TABLET BY MOUTH TWICE DAILY 180 tablet 3   chlorpheniramine-HYDROcodone (TUSSIONEX) 10-8 MG/5ML SUER TAKE 5 MLS BY MOUTH AT BEDTIME AS NEEDED FOR COUGH. 115 mL 0   clonazePAM (KLONOPIN) 0.5 MG tablet TAKE 1 TABLET BY MOUTH 2 TIMES DAILY FOR ANXIETY IF NEEDED 30 tablet 2   drospirenone-ethinyl estradiol (YAZ) 3-0.02 MG tablet TAKE 1 TABLET BY MOUTH DAILY. SKIP PLACEBOS AND TAKE ACTIVE PILLS CONTINUOUSLY. 84 tablet 4   DULoxetine (CYMBALTA) 60 MG  capsule TAKE 2 CAPSULES BY MOUTH DAILY 180 capsule 3   famotidine (PEPCID) 20 MG tablet TAKE 1 TABLET BY MOUTH TWICE DAILY 180 tablet 11   Fluticasone-Salmeterol (ADVAIR) 250-50 MCG/DOSE AEPB INHALE 1 PUFF INTO THE LUNGS IN THE MORNING AND AT BEDTIME. 60 each 5   furosemide (LASIX) 20 MG tablet Take 1 tablet (20 mg total) by mouth daily as needed for edema. 30 tablet 1   levocetirizine (XYZAL) 5 MG tablet TAKE 1 TABLET BY MOUTH EVERY MORNING 30 tablet 5   lisdexamfetamine (VYVANSE) 40 MG capsule Take 1 capsule (40 mg total) by mouth daily before breakfast. 30 capsule 0   [START ON 04/20/2021] lisdexamfetamine (VYVANSE) 40 MG capsule Take 1 capsule (40 mg total) by mouth daily before breakfast. 30 capsule 0   lisdexamfetamine (VYVANSE) 40 MG capsule TAKE 1 CAPSULE BY MOUTH DAILY BEFORE BREAKFAST 30 capsule 0   lisdexamfetamine (VYVANSE) 40 MG capsule TAKE 1 CAPSULE BY MOUTH DAILY BEFORE BREAKFAST DO  NOT FILL UNTIL 04/19/21 30 capsule 0   lisdexamfetamine (VYVANSE) 40 MG capsule TAKE 1 CAPSULE BY MOUTH DAILY BEFORE BREAKFAST DO NOT FILL UNTIL 03/20/21 30 capsule 0   lisdexamfetamine (VYVANSE) 40 MG capsule TAKE 1 CAPSULE (40 MG TOTAL) BY MOUTH DAILY BEFORE BREAKFAST. EFFECTIVE 02/06/21. 30 capsule 0   lisdexamfetamine (VYVANSE) 40 MG capsule TAKE 1 CAPSULE (40 MG TOTAL) BY MOUTH DAILY BEFORE BREAKFAST. 30 capsule 0   lisdexamfetamine (VYVANSE) 50 MG capsule TAKE 1 CAPSULE BY MOUTH DAILY. 30 capsule 0   montelukast (SINGULAIR) 10 MG tablet TAKE 1 TABLET BY MOUTH AT BEDTIME 90 tablet 11   norethindrone (MICRONOR) 0.35 MG tablet TAKE 1 TABLET BY MOUTH ONCE A DAY 28 tablet 14   predniSONE (DELTASONE) 20 MG tablet TAKE 2 TABLETS (40 MG TOTAL) BY MOUTH DAILY WITH BREAKFAST FOR 7 DAYS. 14 tablet 0   rizatriptan (MAXALT) 10 MG tablet TAKE ONE TABLET BY MOUTH AS NEEDED FOR MIGRAINE. MAY REPEAT IN 2 HOURS IF NEEDED 12 tablet 11   rizatriptan (MAXALT-MLT) 10 MG disintegrating tablet DISSOLVE 1 TABLET BY MOUTH AS  NEEDED FOR MIGRAINE, MAY REPEAT IN 2 HOURS IF NEEDED. NO MORE THAN 30MG  IN 24 HOURS (Patient taking differently: Take 10 mg by mouth daily as needed for migraine.) 10 tablet 0   traZODone (DESYREL) 150 MG tablet TAKE 1 TABLET (150 MG TOTAL) BY MOUTH AT BEDTIME AS NEEDED FOR SLEEP. 90 tablet 1   No current facility-administered medications for this visit.      Psychiatric Specialty Exam: Review of Systems  Psychiatric/Behavioral: The patient is nervous/anxious.   All other systems reviewed and are negative.   There were no vitals taken for this visit.There is no height or weight on file to calculate BMI.  General Appearance: Casual  Eye Contact:  Good  Speech:  Clear and Coherent and Normal Rate  Volume:  Normal  Mood: anxiety  Affect:  Full Range  Thought Process:  Goal Directed  Orientation:  Full (Time, Place, and Person)  Thought Content: Logical   Suicidal Thoughts:  No  Homicidal Thoughts:  No  Memory:  Immediate;   Good Recent;   Good Remote;   Good  Judgement:  Good  Insight:  Good  Psychomotor Activity:  Normal  Concentration:  Concentration: Good  Recall:  Good  Fund of Knowledge: Good  Language: Good  Akathisia:  Negative  Handed:  Right  AIMS (if indicated): not done  Assets:  Communication Skills Desire for Improvement Financial Resources/Insurance Housing Resilience Vocational/Educational  ADL's:  Intact  Cognition: WNL  Sleep:  not good   Screenings: GAD-7    Flowsheet Row Office Visit from 10/14/2019 in [facility] Office Visit from 10/28/2018 in [facility] Office Visit from 08/06/2018 in [facility]  Total GAD-7 Score 0 4 15      PHQ2-9    Flowsheet Row Office Visit from 10/14/2019 in [facility] Office Visit from 10/28/2018 in [facility] Office Visit from 08/06/2018 in [facility] Office Visit from 07/14/2015 in  [facility] [place] Office Visit from 03/29/2015 in [facility] Care at [facility] Total Score 0 1 5 0 0  PHQ-9 Total Score 0 1 17 -- --        Assessment and Plan: 45 yo SWF with a hx of recurrent depression, anxiety and ADD. She reports high level of anxiety (both generalized and panic attacks) as well as depression,  racing thoughts, irritability, distractibility, forgetfulness, insomnia (initial and middle) ever since she developed pulmonary embolism after receiving COVID vaccine in February this year. 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 She was diagnosed with ADHD when she was in 5th grade but did not start taking medication for this until college years (was on IR methylphenidate then taking it once daily).  She has been tried on escitalopram (sexual side effects) and bupropion for depression and on alprazolam, clonazepam for anxiety (also briefly on diazepam 5 mg for muscle relaxation/anxiety in immediate aftermath of pulmonary embolism). We have increased trazodone to 150 mg nightly for insomnia and she reports that it works very well. 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 Klonopin is helpful for anxiety attacks and she uses it very sparingly. Sherri Miller reports being in counseling twice a month. We have added duloxetine last month, she is now on 60 mg, tolerates it well and reports clear improvement in mood (less depressed, more active). We added Vyvanse 30 mg for ADD - minimally effective but well tolerated. When dose was increased to 50 mg she became hyperactive. This month she has been on 40  mg and it is well tolerated and helpful. Recently diagnosed with hearing loss - wears hearing aid now.   Dx: ADHD inattentive; Panic disorder; GAD; MDD recurrent in partial remission   Plan: We will continue duloxetine 60 mg, increase trazodone to 200 mg at HS, clonazepam 0.5 mg bid prn  panic anxiety and Vyvanse 40 mg. Next appointment in 2 months. Recommend that patient start therapy to address her anxiety and develop coping skills.  The plan was discussed with patient who had an opportunity to ask questions and these were all answered. I spend 15 minutes in video visit with the patient. I spent 20 minutes on chart review.     Patrick North, MD 04/10/2021, 10:35 AM

## 2021-04-11 ENCOUNTER — Other Ambulatory Visit: Payer: Self-pay

## 2021-04-11 ENCOUNTER — Other Ambulatory Visit: Payer: Self-pay | Admitting: Internal Medicine

## 2021-04-11 ENCOUNTER — Other Ambulatory Visit (HOSPITAL_COMMUNITY): Payer: Self-pay

## 2021-04-11 MED FILL — Lisdexamfetamine Dimesylate Cap 40 MG: ORAL | 30 days supply | Qty: 30 | Fill #0 | Status: AC

## 2021-04-11 MED FILL — Famotidine Tab 20 MG: ORAL | 90 days supply | Qty: 180 | Fill #0 | Status: AC

## 2021-04-11 MED FILL — Trazodone HCl Tab 150 MG: ORAL | 90 days supply | Qty: 90 | Fill #0 | Status: CN

## 2021-04-11 MED FILL — Duloxetine HCl Enteric Coated Pellets Cap 60 MG (Base Eq): ORAL | 90 days supply | Qty: 180 | Fill #0 | Status: AC

## 2021-04-11 MED FILL — Levocetirizine Dihydrochloride Tab 5 MG: ORAL | 30 days supply | Qty: 30 | Fill #0 | Status: AC

## 2021-04-12 ENCOUNTER — Other Ambulatory Visit (HOSPITAL_COMMUNITY): Payer: Self-pay

## 2021-04-12 MED ORDER — ELIQUIS 5 MG PO TABS
ORAL_TABLET | Freq: Two times a day (BID) | ORAL | 3 refills | Status: DC
  Filled 2021-04-12: qty 180, 90d supply, fill #0
  Filled 2021-08-07: qty 180, 90d supply, fill #1
  Filled 2021-11-13: qty 180, 90d supply, fill #2
  Filled 2022-04-04: qty 180, 90d supply, fill #3

## 2021-04-15 ENCOUNTER — Encounter (HOSPITAL_COMMUNITY): Payer: Self-pay | Admitting: Psychiatry

## 2021-04-15 ENCOUNTER — Other Ambulatory Visit (HOSPITAL_COMMUNITY): Payer: Self-pay

## 2021-04-26 ENCOUNTER — Other Ambulatory Visit (HOSPITAL_COMMUNITY): Payer: Self-pay

## 2021-04-30 ENCOUNTER — Other Ambulatory Visit (HOSPITAL_COMMUNITY): Payer: Self-pay

## 2021-05-08 ENCOUNTER — Other Ambulatory Visit (HOSPITAL_COMMUNITY): Payer: Self-pay

## 2021-05-08 ENCOUNTER — Telehealth (HOSPITAL_COMMUNITY): Payer: Self-pay | Admitting: *Deleted

## 2021-05-08 MED FILL — Montelukast Sodium Tab 10 MG (Base Equiv): ORAL | 90 days supply | Qty: 90 | Fill #0 | Status: AC

## 2021-05-08 MED FILL — Levocetirizine Dihydrochloride Tab 5 MG: ORAL | 30 days supply | Qty: 30 | Fill #1 | Status: AC

## 2021-05-08 MED FILL — Lisdexamfetamine Dimesylate Cap 40 MG: ORAL | Qty: 30 | Fill #0 | Status: CN

## 2021-05-08 MED FILL — Trazodone HCl Tab 150 MG: ORAL | 90 days supply | Qty: 90 | Fill #0 | Status: AC

## 2021-05-08 NOTE — Telephone Encounter (Signed)
Placed call to patient to advise of cancelled appointment and need to find psychiatric services from another provider.  Patient informed about letter coming with resources.  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  She stated it will take her 3 months to see another MD.  Reviewed list of providers with patient.  Patient stated she had been suicidal in the past and was upset that she had to find her own provider.  She does not need refills at this time. Patient is not currently suicidal.  She would like another MD at this practice if that is possible.

## 2021-05-09 ENCOUNTER — Other Ambulatory Visit (HOSPITAL_COMMUNITY): Payer: Self-pay

## 2021-05-09 MED FILL — Lisdexamfetamine Dimesylate Cap 40 MG: ORAL | 30 days supply | Qty: 30 | Fill #0 | Status: AC

## 2021-05-09 NOTE — Telephone Encounter (Signed)
Medication management - Message left on pt's identified voicemail to inform of reasons our practice was having to refer her case out to other local providers, due to not having any other providers available to take on additional patients at this time and after we lost the provider that was going to be covering temporarily following her providers retirement.  A note explaining this also emailed to patient in a "Secure" e-mail this date.  Informed patient as well,  this nurse manager would request current covering provider to assist with one additional months supply of medications so patient would have a full 30 days of all medications while locating a new service provider.  Requested patient call back if any questions and also offered 2 locations currently taking new patients.

## 2021-05-28 ENCOUNTER — Other Ambulatory Visit (HOSPITAL_COMMUNITY): Payer: Self-pay | Admitting: Psychiatry

## 2021-05-30 ENCOUNTER — Other Ambulatory Visit (HOSPITAL_COMMUNITY): Payer: Self-pay

## 2021-06-03 ENCOUNTER — Other Ambulatory Visit: Payer: Self-pay

## 2021-06-03 ENCOUNTER — Encounter: Payer: Self-pay | Admitting: Pulmonary Disease

## 2021-06-03 ENCOUNTER — Ambulatory Visit: Payer: 59 | Admitting: Pulmonary Disease

## 2021-06-03 VITALS — BP 102/72 | HR 77 | Temp 98.4°F | Ht 68.0 in | Wt 227.1 lb

## 2021-06-03 DIAGNOSIS — J4540 Moderate persistent asthma, uncomplicated: Secondary | ICD-10-CM | POA: Diagnosis not present

## 2021-06-03 NOTE — Patient Instructions (Signed)
Follow up in 4 months 

## 2021-06-03 NOTE — Progress Notes (Signed)
[name], Critical Care, and Sleep Medicine  Chief Complaint  Patient presents with   Follow-up    Cough-dry. Worse lately with dry air at work.    Constitutional:  BP 102/72 (BP Location: Right Arm, Patient Position: Sitting, Cuff Size: Large)   Pulse 77   Temp 98.4 F (36.9 C) (Oral)   Ht 5\' 8"  (1.727 m)   Wt 227 lb 2 oz (103 kg)   SpO2 99% Comment: room air  BMI 34.53 kg/m   Past Medical History:  Asthma, Migraine HA, Depression, Menorrhagia, COVID 19 infection December Miller  Past Surgical History:  Her  has no past surgical history on file.  Brief Summary:  Sherri Miller is a 45 y.o. female with asthma and pulmonary embolism in February Miller.  She works as Sherri Miller in Administrator, arts.      Subjective:   She was diagnosed with COVID 19 infection in December Miller.  She was treated with prednisone and tussionex in January for an exacerbation.  Better now.  Still gets cough in the morning.  Does okay during the rest of the day.  She is in the process of finding a new PCP and new psychiatrist.  She is worried about how she will get medication refills until she gets established with new providers.  Physical Exam:   Appearance - well kempt   ENMT - no sinus tenderness, no oral exudate, no LAN, Mallampati 2 airway, no stridor  Respiratory - equal breath sounds bilaterally, no wheezing or rales  CV - s1s2 regular rate and rhythm, no murmurs  Ext - no clubbing, no edema  Skin - no rashes  Psych - normal mood and affect    Pulmonary testing:    Chest Imaging:  CT angio chest 01/10/20 >> acute b/l PE  Cardiac Tests:  Doppler Rt leg 01/10/20 >> no DVT Echo 04/05/20 >> EF 60 to 65%, mild LVH  Social History:  She  reports that she has never smoked. She has never used smokeless tobacco. She reports that she does not drink alcohol and does not use drugs.  Family History:  Her She was adopted. Family history is unknown by patient.     Assessment/Plan:    Moderate persistent asthma with allergic rhinitis. - symptoms typically worse in Fall - breo was cost prohibitive - continue advair, singulair, xyzal and prn albuterol - will reschedule PFT  Unprovoked pulmonary embolism. - continue eliquis indefinitely  Time Spent Involved in Patient Care on Day of Examination:  23 minutes  Follow up:   Patient Instructions  Follow up in 4 months  Medication List:   Allergies as of 06/03/2021       Reactions   Codeine Nausea And Vomiting   Penicillins Hives   Did it involve swelling of the face/tongue/throat, SOB, or low BP?No Did it involve sudden or severe rash/hives, skin peeling, or any reaction on the inside of your mouth or nose? Yes Did you need to seek medical attention at a hospital or doctor's office?No When did it last happen? Teenager    If all above answers are "NO", may proceed with cephalosporin use.        Medication List        Accurate as of June 03, 2021 11:24 AM. If you have any questions, ask your nurse or doctor.          STOP taking these medications    predniSONE 20 MG tablet Commonly known as: DELTASONE Stopped by:  [name], MD       TAKE these medications    Advair Diskus 250-50 MCG/ACT Aepb Generic drug: fluticasone-salmeterol INHALE 1 PUFF INTO THE LUNGS IN THE MORNING AND AT BEDTIME.   albuterol 108 (90 Base) MCG/ACT inhaler Commonly known as: VENTOLIN HFA INHALE 2 PUFFS INTO THE LUNGS EVERY 6 HOURS AS NEEDED FOR WHEEZING OR SHORTNESS OF BREATH.   apixaban 5 MG Tabs tablet Commonly known as: ELIQUIS Take by mouth.   Eliquis 5 MG Tabs tablet Generic drug: apixaban TAKE 1 TABLET BY MOUTH TWICE DAILY   chlorpheniramine-HYDROcodone 10-8 MG/5ML Suer Commonly known as: TUSSIONEX TAKE 5 MLS BY MOUTH AT BEDTIME AS NEEDED FOR COUGH.   clonazePAM 0.5 MG tablet Commonly known as: KLONOPIN TAKE 1 TABLET BY MOUTH 2 TIMES DAILY FOR ANXIETY IF NEEDED   DULoxetine 60 MG  capsule Commonly known as: CYMBALTA TAKE 2 CAPSULES BY MOUTH DAILY   famotidine 20 MG tablet Commonly known as: PEPCID TAKE 1 TABLET BY MOUTH TWICE DAILY   furosemide 20 MG tablet Commonly known as: Lasix Take 1 tablet (20 mg total) by mouth daily as needed for edema.   [name] 3-0.02 MG tablet Generic drug: drospirenone-ethinyl estradiol TAKE 1 TABLET BY MOUTH DAILY. SKIP PLACEBOS AND TAKE ACTIVE PILLS CONTINUOUSLY.   levocetirizine 5 MG tablet Commonly known as: XYZAL TAKE 1 TABLET BY MOUTH EVERY MORNING   Vyvanse 50 MG capsule Generic drug: lisdexamfetamine TAKE 1 CAPSULE BY MOUTH DAILY. What changed: Another medication with the same name was removed. Continue taking this medication, and follow the directions you see here. Changed by: [name], MD   Vyvanse 40 MG capsule Generic drug: lisdexamfetamine TAKE 1 CAPSULE (40 MG TOTAL) BY MOUTH DAILY BEFORE BREAKFAST. What changed: Another medication with the same name was removed. Continue taking this medication, and follow the directions you see here. Changed by: [name], MD   Vyvanse 40 MG capsule Generic drug: lisdexamfetamine TAKE 1 CAPSULE BY MOUTH DAILY BEFORE BREAKFAST DO NOT FILL UNTIL 04/19/21 What changed: Another medication with the same name was removed. Continue taking this medication, and follow the directions you see here. Changed by: [name], MD   Vyvanse 40 MG capsule Generic drug: lisdexamfetamine TAKE 1 CAPSULE BY MOUTH DAILY BEFORE BREAKFAST DO NOT FILL UNTIL 03/20/21 What changed: Another medication with the same name was removed. Continue taking this medication, and follow the directions you see here. Changed by: [name], MD   lisdexamfetamine 40 MG capsule Commonly known as: Vyvanse Take 1 capsule (40 mg total) by mouth daily before breakfast. What changed: Another medication with the same name was removed. Continue taking this medication, and follow the directions you see  here. Changed by: [name], MD   montelukast 10 MG tablet Commonly known as: SINGULAIR TAKE 1 TABLET BY MOUTH AT BEDTIME   norethindrone 0.35 MG tablet Commonly known as: MICRONOR TAKE 1 TABLET BY MOUTH ONCE A DAY   rizatriptan 10 MG disintegrating tablet Commonly known as: MAXALT-MLT DISSOLVE 1 TABLET BY MOUTH AS NEEDED FOR MIGRAINE, MAY REPEAT IN 2 HOURS IF NEEDED. NO MORE THAN 30MG  IN 24 HOURS What changed: See the new instructions.   rizatriptan 10 MG tablet Commonly known as: MAXALT TAKE ONE TABLET BY MOUTH AS NEEDED FOR MIGRAINE. MAY REPEAT IN 2 HOURS IF NEEDED What changed: Another medication with the same name was changed. Make sure you understand how and when to take each.   traZODone 150 MG tablet Commonly known as: DESYREL TAKE 1  TABLET (150 MG TOTAL) BY MOUTH AT BEDTIME AS NEEDED FOR SLEEP.        Signature:  [name], MD [facility] Pulmonary/Critical Care Pager - [phone] 06/03/2021, 11:24 AM

## 2021-06-11 ENCOUNTER — Telehealth (HOSPITAL_COMMUNITY): Payer: 59 | Admitting: Psychiatry

## 2021-06-26 ENCOUNTER — Other Ambulatory Visit (HOSPITAL_COMMUNITY): Payer: Self-pay

## 2021-06-26 MED FILL — Clonazepam Tab 0.5 MG: ORAL | 15 days supply | Qty: 30 | Fill #0 | Status: AC

## 2021-06-26 MED FILL — Norethindrone Tab 0.35 MG: ORAL | 84 days supply | Qty: 84 | Fill #1 | Status: AC

## 2021-06-26 MED FILL — Lisdexamfetamine Dimesylate Cap 40 MG: ORAL | 30 days supply | Qty: 30 | Fill #0 | Status: AC

## 2021-06-26 MED FILL — Levocetirizine Dihydrochloride Tab 5 MG: ORAL | 30 days supply | Qty: 30 | Fill #2 | Status: AC

## 2021-08-07 ENCOUNTER — Other Ambulatory Visit (HOSPITAL_COMMUNITY): Payer: Self-pay

## 2021-08-07 MED FILL — Famotidine Tab 20 MG: ORAL | 90 days supply | Qty: 180 | Fill #1 | Status: AC

## 2021-08-07 MED FILL — Trazodone HCl Tab 150 MG: ORAL | 90 days supply | Qty: 90 | Fill #1 | Status: AC

## 2021-08-07 MED FILL — Montelukast Sodium Tab 10 MG (Base Equiv): ORAL | 90 days supply | Qty: 90 | Fill #1 | Status: AC

## 2021-08-08 ENCOUNTER — Other Ambulatory Visit (HOSPITAL_COMMUNITY): Payer: Self-pay

## 2021-08-09 NOTE — Telephone Encounter (Signed)
Recalcitrant cough, refill order.

## 2021-09-04 ENCOUNTER — Telehealth (HOSPITAL_COMMUNITY): Payer: Self-pay

## 2021-09-04 NOTE — Telephone Encounter (Signed)
Medication refill request - Pt sent an email that she was all set up to see another provider but then they canceled on her and now she is without medications, writing she is completely out of Vyvanse and Trazodone.  Pt. requested immediate refills of both and to be set up with a [facility] Outpatient provider again since she has been unsuccessful in getting set up with a new psychiatrist in the community.  Discussed with Dr. Lucianne Muss to review.

## 2021-09-05 ENCOUNTER — Other Ambulatory Visit (HOSPITAL_COMMUNITY): Payer: Self-pay | Admitting: Psychiatry

## 2021-09-05 ENCOUNTER — Other Ambulatory Visit (HOSPITAL_COMMUNITY): Payer: Self-pay

## 2021-09-05 DIAGNOSIS — F411 Generalized anxiety disorder: Secondary | ICD-10-CM

## 2021-09-05 MED ORDER — TRAZODONE HCL 150 MG PO TABS
150.0000 mg | ORAL_TABLET | Freq: Every day | ORAL | 0 refills | Status: DC
  Filled 2021-09-05 – 2021-11-13 (×2): qty 30, 30d supply, fill #0

## 2021-09-05 MED ORDER — LISDEXAMFETAMINE DIMESYLATE 40 MG PO CAPS
40.0000 mg | ORAL_CAPSULE | ORAL | 0 refills | Status: DC
  Filled 2021-09-05: qty 30, 30d supply, fill #0

## 2021-09-05 NOTE — Telephone Encounter (Signed)
Refill for 30 days done for Vyvanse and trazadone

## 2021-09-06 ENCOUNTER — Other Ambulatory Visit (HOSPITAL_COMMUNITY): Payer: Self-pay

## 2021-10-03 ENCOUNTER — Emergency Department: Payer: 59

## 2021-10-03 ENCOUNTER — Inpatient Hospital Stay (HOSPITAL_COMMUNITY): Admission: AD | Admit: 2021-10-03 | Payer: 59 | Source: Other Acute Inpatient Hospital | Admitting: Surgery

## 2021-10-03 ENCOUNTER — Inpatient Hospital Stay
Admission: EM | Admit: 2021-10-03 | Discharge: 2021-10-05 | DRG: 418 | Disposition: A | Discharge status: Home / Self Care | Payer: 59 | Attending: Surgery | Admitting: Surgery

## 2021-10-03 ENCOUNTER — Encounter: Payer: Self-pay | Admitting: Emergency Medicine

## 2021-10-03 ENCOUNTER — Other Ambulatory Visit: Payer: Self-pay

## 2021-10-03 DIAGNOSIS — E669 Obesity, unspecified: Secondary | ICD-10-CM | POA: Diagnosis present

## 2021-10-03 DIAGNOSIS — K810 Acute cholecystitis: Secondary | ICD-10-CM | POA: Diagnosis not present

## 2021-10-03 DIAGNOSIS — I96 Gangrene, not elsewhere classified: Secondary | ICD-10-CM | POA: Diagnosis present

## 2021-10-03 DIAGNOSIS — F429 Obsessive-compulsive disorder, unspecified: Secondary | ICD-10-CM | POA: Diagnosis present

## 2021-10-03 DIAGNOSIS — Z20822 Contact with and (suspected) exposure to covid-19: Secondary | ICD-10-CM | POA: Diagnosis not present

## 2021-10-03 DIAGNOSIS — K82A1 Gangrene of gallbladder in cholecystitis: Secondary | ICD-10-CM | POA: Diagnosis not present

## 2021-10-03 DIAGNOSIS — Z7901 Long term (current) use of anticoagulants: Secondary | ICD-10-CM

## 2021-10-03 DIAGNOSIS — Z86711 Personal history of pulmonary embolism: Secondary | ICD-10-CM | POA: Diagnosis not present

## 2021-10-03 DIAGNOSIS — R079 Chest pain, unspecified: Secondary | ICD-10-CM | POA: Diagnosis not present

## 2021-10-03 DIAGNOSIS — Z7951 Long term (current) use of inhaled steroids: Secondary | ICD-10-CM

## 2021-10-03 DIAGNOSIS — K8020 Calculus of gallbladder without cholecystitis without obstruction: Secondary | ICD-10-CM | POA: Diagnosis present

## 2021-10-03 DIAGNOSIS — K828 Other specified diseases of gallbladder: Secondary | ICD-10-CM | POA: Diagnosis not present

## 2021-10-03 DIAGNOSIS — R1011 Right upper quadrant pain: Secondary | ICD-10-CM

## 2021-10-03 DIAGNOSIS — K8000 Calculus of gallbladder with acute cholecystitis without obstruction: Principal | ICD-10-CM | POA: Diagnosis present

## 2021-10-03 DIAGNOSIS — R0789 Other chest pain: Secondary | ICD-10-CM | POA: Diagnosis not present

## 2021-10-03 DIAGNOSIS — Z6834 Body mass index (BMI) 34.0-34.9, adult: Secondary | ICD-10-CM | POA: Diagnosis not present

## 2021-10-03 DIAGNOSIS — E876 Hypokalemia: Secondary | ICD-10-CM | POA: Diagnosis present

## 2021-10-03 LAB — HEPATIC FUNCTION PANEL
ALT: 15 U/L (ref 0–44)
AST: 22 U/L (ref 15–41)
Albumin: 4.4 g/dL (ref 3.5–5.0)
Alkaline Phosphatase: 88 U/L (ref 38–126)
Bilirubin, Direct: <0.1 mg/dL (ref 0.0–0.2)
Total Bilirubin: 0.5 mg/dL (ref 0.3–1.2)
Total Protein: 7.6 g/dL (ref 6.5–8.1)

## 2021-10-03 LAB — BASIC METABOLIC PANEL
Anion gap: 6 (ref 5–15)
BUN: 16 mg/dL (ref 6–20)
CO2: 26 mmol/L (ref 22–32)
Calcium: 9.5 mg/dL (ref 8.9–10.3)
Chloride: 106 mmol/L (ref 98–111)
Creatinine, Ser: 0.99 mg/dL (ref 0.44–1.00)
GFR, Estimated: >60 mL/min (ref >60)
Glucose, Bld: 135 mg/dL — ABNORMAL HIGH (ref 70–99)
Potassium: 3.1 mmol/L — ABNORMAL LOW (ref 3.5–5.1)
Sodium: 138 mmol/L (ref 135–145)

## 2021-10-03 LAB — HEPARIN LEVEL (UNFRACTIONATED): Heparin Unfractionated: 0.99 IU/mL — ABNORMAL HIGH (ref 0.30–0.70)

## 2021-10-03 LAB — CBC
HCT: 39 % (ref 36.0–46.0)
Hemoglobin: 13.1 g/dL (ref 12.0–15.0)
MCH: 28.8 pg (ref 26.0–34.0)
MCHC: 33.6 g/dL (ref 30.0–36.0)
MCV: 85.7 fL (ref 80.0–100.0)
Platelets: 241 10*3/uL (ref 150–400)
RBC: 4.55 MIL/uL (ref 3.87–5.11)
RDW: 12.6 % (ref 11.5–15.5)
WBC: 4.8 10*3/uL (ref 4.0–10.5)
nRBC: 0 % (ref 0.0–0.2)

## 2021-10-03 LAB — TROPONIN I (HIGH SENSITIVITY)
Troponin I (High Sensitivity): <2 ng/L (ref <18)
Troponin I (High Sensitivity): 3 ng/L (ref <18)

## 2021-10-03 LAB — D-DIMER, QUANTITATIVE: D-Dimer, Quant: 0.41 ug/mL-FEU (ref 0.00–0.50)

## 2021-10-03 LAB — PROTIME-INR
INR: 1.1 (ref 0.8–1.2)
Prothrombin Time: 13.9 seconds (ref 11.4–15.2)

## 2021-10-03 LAB — RESP PANEL BY RT-PCR (FLU A&B, COVID) ARPGX2
Influenza A by PCR: NEGATIVE
Influenza B by PCR: NEGATIVE
SARS Coronavirus 2 by RT PCR: NEGATIVE

## 2021-10-03 LAB — APTT: aPTT: 29 seconds (ref 24–36)

## 2021-10-03 LAB — LIPASE, BLOOD: Lipase: 34 U/L (ref 11–51)

## 2021-10-03 MED ORDER — DULOXETINE HCL 60 MG PO CPEP
120.0000 mg | ORAL_CAPSULE | Freq: Every day | ORAL | Status: DC
  Administered 2021-10-03 – 2021-10-05 (×2): 120 mg via ORAL
  Filled 2021-10-03 (×2): qty 2

## 2021-10-03 MED ORDER — FUROSEMIDE 20 MG PO TABS: 20.0000 mg | ORAL_TABLET | Freq: Every day | ORAL | Status: DC | PRN

## 2021-10-03 MED ORDER — FLUTICASONE FUROATE-VILANTEROL 200-25 MCG/ACT IN AEPB
1.0000 | INHALATION_SPRAY | Freq: Every day | RESPIRATORY_TRACT | Status: DC
Start: 2021-10-04 — End: 2021-10-05
  Filled 2021-10-03: qty 28

## 2021-10-03 MED ORDER — KETOROLAC TROMETHAMINE 30 MG/ML IJ SOLN
15.0000 mg | INTRAMUSCULAR | Status: AC
  Administered 2021-10-03: 15 mg via INTRAVENOUS
  Filled 2021-10-03: qty 1

## 2021-10-03 MED ORDER — HYDROMORPHONE HCL 1 MG/ML IJ SOLN
0.5000 mg | INTRAMUSCULAR | Status: DC | PRN
  Administered 2021-10-03 (×2): 1 mg via INTRAVENOUS
  Administered 2021-10-04: 0.5 mg via INTRAVENOUS
  Filled 2021-10-03 (×3): qty 1

## 2021-10-03 MED ORDER — ALBUTEROL SULFATE (2.5 MG/3ML) 0.083% IN NEBU: 3.0000 mL | INHALATION_SOLUTION | Freq: Four times a day (QID) | RESPIRATORY_TRACT | Status: DC | PRN

## 2021-10-03 MED ORDER — POTASSIUM CHLORIDE CRYS ER 20 MEQ PO TBCR
20.0000 meq | EXTENDED_RELEASE_TABLET | Freq: Once | ORAL | Status: AC
  Administered 2021-10-03: 20 meq via ORAL
  Filled 2021-10-03: qty 1

## 2021-10-03 MED ORDER — TRAZODONE HCL 50 MG PO TABS
150.0000 mg | ORAL_TABLET | Freq: Every day | ORAL | Status: DC
  Administered 2021-10-03 – 2021-10-04 (×2): 150 mg via ORAL
  Filled 2021-10-03 (×2): qty 1

## 2021-10-03 MED ORDER — ONDANSETRON 4 MG PO TBDP
4.0000 mg | ORAL_TABLET | Freq: Four times a day (QID) | ORAL | Status: DC | PRN
  Filled 2021-10-03: qty 1

## 2021-10-03 MED ORDER — LISDEXAMFETAMINE DIMESYLATE 20 MG PO CAPS
40.0000 mg | ORAL_CAPSULE | ORAL | Status: DC
  Filled 2021-10-03 (×2): qty 2

## 2021-10-03 MED ORDER — HYDROMORPHONE HCL 1 MG/ML IJ SOLN
1.0000 mg | Freq: Once | INTRAMUSCULAR | Status: AC
  Administered 2021-10-03: 1 mg via INTRAVENOUS
  Filled 2021-10-03: qty 1

## 2021-10-03 MED ORDER — LACTATED RINGERS IV BOLUS
1000.0000 mL | Freq: Once | INTRAVENOUS | Status: AC
  Administered 2021-10-03: 1000 mL via INTRAVENOUS

## 2021-10-03 MED ORDER — OXYCODONE HCL 5 MG PO TABS
5.0000 mg | ORAL_TABLET | ORAL | Status: DC | PRN
  Administered 2021-10-03: 5 mg via ORAL
  Administered 2021-10-04: 10 mg via ORAL
  Administered 2021-10-04: 5 mg via ORAL
  Administered 2021-10-05: 10 mg via ORAL
  Filled 2021-10-03: qty 1
  Filled 2021-10-03: qty 2
  Filled 2021-10-03 (×3): qty 1

## 2021-10-03 MED ORDER — FAMOTIDINE 20 MG PO TABS
20.0000 mg | ORAL_TABLET | Freq: Two times a day (BID) | ORAL | Status: DC
  Administered 2021-10-03 – 2021-10-05 (×4): 20 mg via ORAL
  Filled 2021-10-03 (×4): qty 1

## 2021-10-03 MED ORDER — METRONIDAZOLE 500 MG/100ML IV SOLN
500.0000 mg | Freq: Two times a day (BID) | INTRAVENOUS | Status: DC
  Administered 2021-10-03 – 2021-10-05 (×3): 500 mg via INTRAVENOUS
  Filled 2021-10-03 (×3): qty 100

## 2021-10-03 MED ORDER — ACETAMINOPHEN 500 MG PO TABS
1000.0000 mg | ORAL_TABLET | Freq: Four times a day (QID) | ORAL | Status: DC
  Administered 2021-10-03 – 2021-10-05 (×5): 1000 mg via ORAL
  Filled 2021-10-03 (×5): qty 2

## 2021-10-03 MED ORDER — MORPHINE SULFATE (PF) 4 MG/ML IV SOLN
4.0000 mg | Freq: Once | INTRAVENOUS | Status: AC
  Administered 2021-10-03: 4 mg via INTRAVENOUS
  Filled 2021-10-03: qty 1

## 2021-10-03 MED ORDER — SUMATRIPTAN SUCCINATE 50 MG PO TABS
100.0000 mg | ORAL_TABLET | Freq: Every day | ORAL | Status: DC | PRN
  Filled 2021-10-03: qty 2

## 2021-10-03 MED ORDER — METRONIDAZOLE 500 MG/100ML IV SOLN
500.0000 mg | Freq: Once | INTRAVENOUS | Status: DC
  Filled 2021-10-03: qty 100

## 2021-10-03 MED ORDER — SODIUM CHLORIDE 0.9 % IV SOLN
2.0000 g | Freq: Once | INTRAVENOUS | Status: DC
  Filled 2021-10-03: qty 20

## 2021-10-03 MED ORDER — POTASSIUM CHLORIDE IN NACL 20-0.9 MEQ/L-% IV SOLN
INTRAVENOUS | Status: DC
  Filled 2021-10-03 (×5): qty 1000

## 2021-10-03 MED ORDER — SODIUM CHLORIDE 0.9 % IV SOLN
2.0000 g | INTRAVENOUS | Status: DC
  Administered 2021-10-03 – 2021-10-04 (×2): 2 g via INTRAVENOUS
  Filled 2021-10-03: qty 2
  Filled 2021-10-03: qty 20

## 2021-10-03 MED ORDER — LACTATED RINGERS IV SOLN: INTRAVENOUS | Status: DC

## 2021-10-03 MED ORDER — ONDANSETRON HCL 4 MG/2ML IJ SOLN
4.0000 mg | Freq: Four times a day (QID) | INTRAMUSCULAR | Status: DC | PRN
  Administered 2021-10-03 – 2021-10-04 (×3): 4 mg via INTRAVENOUS
  Filled 2021-10-03 (×3): qty 2

## 2021-10-03 MED ORDER — PANTOPRAZOLE SODIUM 40 MG IV SOLR
40.0000 mg | Freq: Once | INTRAVENOUS | Status: AC
  Administered 2021-10-03: 40 mg via INTRAVENOUS
  Filled 2021-10-03: qty 40

## 2021-10-03 MED ORDER — LORATADINE 10 MG PO TABS
10.0000 mg | ORAL_TABLET | Freq: Every morning | ORAL | Status: DC
  Administered 2021-10-05: 10 mg via ORAL
  Filled 2021-10-03 (×2): qty 1

## 2021-10-03 MED ORDER — INDOCYANINE GREEN 25 MG IV SOLR
7.5000 mg | INTRAVENOUS | Status: AC
  Administered 2021-10-04: 7.5 mg via INTRAVENOUS
  Filled 2021-10-03: qty 3

## 2021-10-03 MED ORDER — ONDANSETRON HCL 4 MG/2ML IJ SOLN
4.0000 mg | Freq: Once | INTRAMUSCULAR | Status: AC
  Administered 2021-10-03: 4 mg via INTRAVENOUS
  Filled 2021-10-03: qty 2

## 2021-10-03 MED ORDER — MONTELUKAST SODIUM 10 MG PO TABS
10.0000 mg | ORAL_TABLET | Freq: Every day | ORAL | Status: DC
  Administered 2021-10-03 – 2021-10-04 (×2): 10 mg via ORAL
  Filled 2021-10-03 (×2): qty 1

## 2021-10-03 NOTE — ED Notes (Signed)
US in room 

## 2021-10-03 NOTE — ED Notes (Signed)
Spoke with Gala Romney at [facility] ([facility]) to start transfer request per pt request for Cholecystitis under the direction of Dr Scotty Court

## 2021-10-03 NOTE — ED Triage Notes (Signed)
Pt to ED from home c/o left sided chest pain 1hr PTA that woke her from sleep like she's full or pressure radiating to left shoulder.  SOB, Nausea without vomiting, hx of PE's 2 years ago.  Takes eliquis.  Pt A&Ox4, uncomfortable in triage, hx of anxiety but states this feels different than anxiety attack or prior PEs, skin WNL.

## 2021-10-03 NOTE — ED Provider Notes (Signed)
[facility] Emergency Department Provider Note  ____________________________________________  Time seen: Approximately 8:41 AM  I have reviewed the triage vital signs and the nursing notes.   HISTORY  Chief Complaint Chest Pain    HPI Sherri Miller is a 45 y.o. female with a history of PE migraines and OCD who comes the ED complaining of waking up with epigastric pain radiating up to the left side of the chest that woke her from sleep this morning.  Feels like pressure.  No vomiting diaphoresis or significant shortness of breath.  No aggravating or alleviating factors.  Feels severe and constant.  Not exertional, not pleuritic.  Does not feel like her symptoms from PE that she had before which was provoked by COVID-vaccine.  She has been compliant with her Eliquis that she still takes.  Does not feel like an anxiety attack, although notably patient had an episode in the waiting room where she reports she passed out, but nursing notes that patient was able to continue holding a drink without spilling it throughout the episode.  She notes she was in her usual state of health last night at bedtime and had no preceding symptoms.    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 No pertinent surgical history.   Prior to Admission medications   Medication Sig Start Date End Date Taking? Authorizing Provider  albuterol (VENTOLIN HFA) 108 (90 Base) MCG/ACT inhaler INHALE 2 PUFFS INTO THE LUNGS EVERY 6 HOURS AS NEEDED FOR WHEEZING OR SHORTNESS OF BREATH. 11/06/20 11/06/21 Yes Sood, Laurier Nancy, MD  apixaban (ELIQUIS) 5 MG TABS tablet TAKE 1 TABLET BY MOUTH TWICE DAILY 04/12/21 04/12/22 Yes [name], MD  DULoxetine (CYMBALTA) 60 MG capsule TAKE 2 CAPSULES BY MOUTH DAILY 01/08/21 01/08/22 Yes Pucilowski, Olgierd A, MD  famotidine (PEPCID) 20 MG tablet TAKE 1 TABLET BY MOUTH TWICE DAILY 01/08/21 01/08/22 Yes Lorin Glass, MD  Fluticasone-Salmeterol (ADVAIR) 250-50 MCG/DOSE AEPB INHALE 1 PUFF INTO THE LUNGS IN THE MORNING AND AT BEDTIME. 11/06/20 11/06/21 Yes [name], MD  furosemide (LASIX) 20 MG tablet Take 1 tablet (20 mg total) by mouth daily as needed for edema. 02/26/20  Yes Steffanie Dunn, DO  levocetirizine (XYZAL) 5 MG tablet TAKE 1 TABLET BY MOUTH EVERY MORNING 11/06/20 11/06/21 Yes [name], MD  lisdexamfetamine (VYVANSE) 40 MG capsule Take 1 capsule (40 mg total) by mouth every morning. 09/05/21 10/06/21 Yes Nelly Rout, MD  montelukast (SINGULAIR) 10 MG tablet TAKE 1 TABLET BY MOUTH AT BEDTIME 01/02/21 01/02/22 Yes Coral Ceo, NP  rizatriptan (MAXALT-MLT) 10 MG disintegrating tablet DISSOLVE 1 TABLET BY MOUTH AS NEEDED FOR MIGRAINE, MAY REPEAT IN 2 HOURS IF NEEDED. NO MORE THAN  IN 24 HOURS Patient taking differently: Take 10 mg by mouth daily as needed for migraine. 12/22/19  Yes Nche, Bonna Gains, NP  traZODone (DESYREL) 150 MG tablet Take 1 tablet (150 mg total) by mouth at bedtime. 09/05/21 10/05/21 Yes Nelly Rout, MD  clonazePAM (KLONOPIN)  0.5 MG tablet TAKE 1 TABLET BY MOUTH 2 TIMES DAILY FOR ANXIETY IF NEEDED 01/08/21 07/11/21  Pucilowski, Roosvelt Maser, MD  drospirenone-ethinyl estradiol (YAZ) 3-0.02 MG tablet TAKE 1 TABLET BY MOUTH DAILY. SKIP PLACEBOS AND TAKE ACTIVE PILLS CONTINUOUSLY. 08/08/20 08/08/21  Lynnea Ferrier., MD  norethindrone (MICRONOR) 0.35 MG tablet TAKE 1 TABLET BY MOUTH ONCE A DAY Patient not taking: No sig reported 10/31/20 10/31/21  Almon Register, DO  rizatriptan (MAXALT) 10 MG tablet TAKE ONE TABLET BY MOUTH AS NEEDED FOR MIGRAINE. MAY REPEAT IN 2 HOURS IF NEEDED 01/17/20 01/16/21  Linus Salmons, MD     Allergies Codeine and Penicillins   Family History  Adopted: Yes  Family history unknown: Yes    Social History Social History   Tobacco Use   Smoking status: Never   Smokeless tobacco: Never  Vaping Use   Vaping Use: Never used  Substance Use Topics   Alcohol use: No    Alcohol/week: 0.0 standard drinks   Drug use: No    Review of Systems  Constitutional:   No fever or chills.  ENT:   No sore throat. No rhinorrhea. Cardiovascular:   No chest pain or syncope. Respiratory:   No dyspnea or cough. Gastrointestinal:   Positive epigastric pain as above without vomiting and diarrhea.  Musculoskeletal:   Negative for focal pain or swelling All other systems reviewed and are negative except as documented above in ROS and HPI.  ____________________________________________   PHYSICAL EXAM:  VITAL SIGNS: ED Triage Vitals  Enc Vitals Group     BP 10/03/21 0619 139/89     Pulse Rate 10/03/21 0619 84     Resp 10/03/21 0619 20     Temp 10/03/21 0619 98.2 F (36.8 C)     Temp Source 10/03/21 0619 Oral     SpO2 10/03/21 0619 97 %     Weight 10/03/21 0506 230 lb (104.3 kg)     Height 10/03/21 0506  (1.727 m)     Head Circumference --      Peak Flow --      Pain Score 10/03/21 0506 8     Pain Loc --      Pain Edu? --      Excl. in GC? --     Vital signs reviewed, nursing  assessments reviewed.   Constitutional:   Alert and oriented. Non-toxic appearance. Eyes:   Conjunctivae are normal. EOMI. PERRL. ENT      Head:   Normocephalic and atraumatic.      Nose:   Wearing a mask.      Mouth/Throat:   Wearing a mask.      Neck:   No meningismus. Full ROM. Hematological/Lymphatic/Immunilogical:   No cervical lymphadenopathy. Cardiovascular:   RRR. Symmetric bilateral radial and DP pulses.  No murmurs. Cap refill less than 2 seconds. Respiratory:   Normal respiratory effort without tachypnea/retractions. Breath sounds are clear and equal bilaterally. No wheezes/rales/rhonchi. Gastrointestinal:   Soft with epigastric and right upper quadrant tenderness . Non distended. There is  no CVA tenderness.  No rebound, rigidity, or guarding. Genitourinary:   deferred Musculoskeletal:   Normal range of motion in all extremities. No joint effusions.  No lower extremity tenderness.  No edema. Neurologic:   Normal speech and language.  Motor grossly intact. No acute focal neurologic deficits are appreciated.  Skin:    Skin is warm, dry and intact. No rash noted.  No petechiae, purpura, or bullae.  ____________________________________________    LABS (pertinent positives/negatives) (all labs ordered are listed, but only abnormal results are displayed) Labs Reviewed  BASIC METABOLIC PANEL - Abnormal; Notable for the following components:      Result Value   Potassium 3.1 (*)    Glucose, Bld 135 (*)    All other components within normal limits  HEPARIN LEVEL (UNFRACTIONATED) - Abnormal; Notable for the following components:   Heparin Unfractionated 0.99 (*)    All other components within normal limits  RESP PANEL BY RT-PCR (FLU A&B, COVID) ARPGX2  CBC  D-DIMER, QUANTITATIVE  LIPASE, BLOOD  HEPATIC FUNCTION PANEL  PROTIME-INR  APTT  TROPONIN I (HIGH SENSITIVITY)  TROPONIN I (HIGH SENSITIVITY)   ____________________________________________   EKG  Interpreted by  me  Date: 10/03/2021  Rate: 69  Rhythm: normal sinus rhythm  QRS Axis: normal  Intervals: normal  ST/T Wave abnormalities: normal  Conduction Disutrbances: none  Narrative Interpretation: unremarkable     ____________________________________________    RADIOLOGY  DG Chest 2 View  Result Date: 10/03/2021 CLINICAL DATA:  45 year old female with history of chest pain. EXAM: CHEST - 2 VIEW COMPARISON:  Chest x-ray 01/13/2020. FINDINGS: Lung volumes are normal. No consolidative airspace disease. No pleural effusions. No pneumothorax. No pulmonary nodule or mass noted. Pulmonary vasculature and the cardiomediastinal silhouette are within normal limits. IMPRESSION: 1.  No radiographic evidence of acute cardiopulmonary disease. Electronically Signed   By: Trudie Reed M.D.   On: 10/03/2021 05:46   US Abdomen Limited RUQ (LIVER/GB)  Result Date: 10/03/2021 CLINICAL DATA:  RIGHT upper quadrant pain. EXAM: ULTRASOUND ABDOMEN LIMITED RIGHT UPPER QUADRANT COMPARISON:  CTA PE, 01/10/2020. FINDINGS: Gallbladder: Dependent echogenic gallstones within a mildly distended gallbladder, with largest stone measuring up to 1.3 cm. No discrete gallbladder wall thickening or pericholecystic fluid. A positive sonographic Eulah Pont sign was reported by sonographer. Common bile duct: Diameter: 0.4 cm Liver: No focal lesion identified. Within normal limits in parenchymal echogenicity. Portal vein is patent on color Doppler imaging with normal direction of blood flow towards the liver. Other: No perihepatic ascites IMPRESSION: Mildly distended gallbladder with multiple stones. Positive sonographic Murphy's sign. Findings suspicious for acute calculus cholecystitis. Electronically Signed   By: Roanna Banning M.D.   On: 10/03/2021 09:30    ____________________________________________   PROCEDURES Procedures  ____________________________________________  DIFFERENTIAL DIAGNOSIS   Pancreatitis, cholecystitis,  choledocholithiasis, gastritis  CLINICAL IMPRESSION / ASSESSMENT AND PLAN / ED COURSE  Medications ordered in the ED: Medications  cefTRIAXone (ROCEPHIN) 2 g in sodium chloride 0.9 % 100 mL IVPB (has no administration in time range)  metroNIDAZOLE (FLAGYL) IVPB 500 mg (has no administration in time range)  rizatriptan (MAXALT-MLT) disintegrating tablet 10 mg (has no administration in time range)  furosemide (LASIX) tablet 20 mg (has no administration in time range)  DULoxetine (CYMBALTA) DR capsule 60 mg (has no administration in time range)  lisdexamfetamine (VYVANSE) capsule 40 mg (has no administration in time range)  traZODone (DESYREL) tablet 150 mg (has no administration in time range)  famotidine (PEPCID) tablet 20 mg (has no administration  in time range)  montelukast (SINGULAIR) tablet 10 mg (has no administration in time range)  levocetirizine (XYZAL) tablet 5 mg (has no administration in time range)  albuterol (VENTOLIN HFA) 108 (90 Base) MCG/ACT inhaler 2 puff (has no administration in time range)  fluticasone furoate-vilanterol (BREO ELLIPTA) 200-25 MCG/ACT 1 puff (has no administration in time range)  0.9 % NaCl with KCl 20 mEq/ L  infusion (has no administration in time range)  cefTRIAXone (ROCEPHIN) 2 g in sodium chloride 0.9 % 100 mL IVPB (has no administration in time range)  metroNIDAZOLE (FLAGYL) IVPB 500 mg (has no administration in time range)  acetaminophen (TYLENOL) tablet 1,000 mg (has no administration in time range)  oxyCODONE (Oxy IR/ROXICODONE) immediate release tablet 5-10 mg (has no administration in time range)  HYDROmorphone (DILAUDID) injection 0.5-1 mg (has no administration in time range)  ondansetron (ZOFRAN-ODT) disintegrating tablet 4 mg (has no administration in time range)    Or  ondansetron (ZOFRAN) injection 4 mg (has no administration in time range)  ondansetron (ZOFRAN) injection 4 mg (4 mg Intravenous Given 10/03/21 0829)  HYDROmorphone  (DILAUDID) injection 1 mg (1 mg Intravenous Given 10/03/21 0830)  pantoprazole (PROTONIX) injection 40 mg (40 mg Intravenous Given 10/03/21 0844)  ketorolac (TORADOL) 30 MG/ML injection 15 mg (15 mg Intravenous Given 10/03/21 0832)  lactated ringers bolus 1,000 mL (0 mLs Intravenous Stopped 10/03/21 1027)  morphine 4 MG/ML injection 4 mg (4 mg Intravenous Given 10/03/21 0902)  HYDROmorphone (DILAUDID) injection 1 mg (1 mg Intravenous Given 10/03/21 1054)    Pertinent labs & imaging results that were available during my care of the patient were reviewed by me and considered in my medical decision making (see chart for details).  Sherri Miller was evaluated in Emergency Department on 10/03/2021 for the symptoms described in the history of present illness. She was evaluated in the context of the global COVID-19 pandemic, which necessitated consideration that the patient might be at risk for infection with the SARS-CoV-2 virus that causes COVID-19. Institutional protocols and algorithms that pertain to the evaluation of patients at risk for COVID-19 are in a state of rapid change based on information released by regulatory bodies including the CDC and federal and state organizations. These policies and algorithms were followed during the patient's care in the ED.     Clinical Course as of 10/03/21 1142  Thu Oct 03, 2021  [id number] Patient presents with severe epigastric pain radiating up into the chest, concerning for GERD versus biliary disease versus pancreatitis.  Will give IV fluids, IV Dilaudid 1 mg for pain control, Toradol Protonix Zofran, obtain right upper quadrant ultrasound. [PS]  [id number] [place] confirms acute cholecystitis. Pain is still 4/10. Results d/w pt, who requests transfer to [place] for continuity of care. Will d/w [facility] surgery [PS]  1026 Case discussed with [facility] [place] [facility] Dr. Phylliss Blakes who advises that [name] does not have any capacity to accept the patient  within their system, and currently have a wait list of multiple patients who have been waiting since yesterday for a bed.  They will add the patient to their wait list, and I advised that we need to consult surgery at [facility]. [PS]    Clinical Course User Index [PS] Sharman Cheek, MD     ____________________________________________   FINAL CLINICAL IMPRESSION(S) / ED DIAGNOSES    Final diagnoses:  RUQ pain  Acute calculous cholecystitis  Anticoagulated     ED Discharge Orders  None       Portions of this note were generated with dragon dictation software. Dictation errors may occur despite best attempts at proofreading.    Sharman Cheek, MD 10/03/21 1143

## 2021-10-03 NOTE — ED Triage Notes (Signed)
Pt alerted staff that she was going to pass out. Pt then slid into the floor while holding drink up to prevent a spill. Pt inquired as to why she was not in a room. While in the floor pt heard phone alert and asked mom to get her phone so she could check it. Staff assisting pt from floor to chair. Pt paced in recliner to prevent sliding out into the floor again.

## 2021-10-03 NOTE — ED Notes (Signed)
Pt provided decaf tea.

## 2021-10-03 NOTE — ED Notes (Signed)
Pt ambulating to bathroom, gait steady, denies dizziness.

## 2021-10-03 NOTE — H&P (Addendum)
[facility] SURGICAL HISTORY & PHYSICAL (cpt [phone])  HISTORY OF PRESENT ILLNESS (HPI):  45 y.o. female presented to [facility] ED today for abdominal pain. Patient reports she awoke from sleep this morning with acute epigastric abdominal pain which was radiating to her left side and into her chest. She described this as feeling like a pressure. No fever, chills, nausea, emesis, or bowel changes. No history of similar in the past. 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 She does appear to be compliant with this; last taken last night around 2100. No previous intra-abdominal surgery. Laboratory work up in the ED was relatively benign aside from mild hypokalemia to 3.1. She did have a CXR which was normal as well. RUQ [place] was obtained and showed cholelithiasis and distended gallbladder. Sonographic murphy's sign was noted to be positive.   Please note, she is a Magazine features editor at [employer], and she did request transfer there. This was attempted by the EDP but the wait time is very high. She does note some concern with having surgery tomorrow given her Eliquis use. She has started to feel better in the ED since receiving pain medications. She is interested in potential for going home and then following up as an outpatient for elective interval cholecystectomy.   General surgery is consulted by emergency medicines physician Dr Sharman Cheek, MD for evaluation and management of acute cholecystitis.    PAST MEDICAL HISTORY (PMH):  Past Medical History:  Diagnosis Date   Allergy    Asthma    Chicken pox    Depression    Migraine    Migraines    Pulmonary embolism (HCC)    Pulmonary embolism (HCC) 01/2020    Reviewed. Otherwise negative.   PAST SURGICAL HISTORY (PSH):  History reviewed. No pertinent surgical history.  Reviewed. Otherwise negative.   MEDICATIONS:  Prior to Admission medications   Medication Sig Start Date End Date Taking? Authorizing Provider  albuterol  (VENTOLIN HFA) 108 (90 Base) MCG/ACT inhaler INHALE 2 PUFFS INTO THE LUNGS EVERY 6 HOURS AS NEEDED FOR WHEEZING OR SHORTNESS OF BREATH. 11/06/20 11/06/21 Yes Sood, Laurier Nancy, MD  apixaban (ELIQUIS) 5 MG TABS tablet TAKE 1 TABLET BY MOUTH TWICE DAILY 04/12/21 04/12/22 Yes [name], MD  DULoxetine (CYMBALTA) 60 MG capsule TAKE 2 CAPSULES BY MOUTH DAILY 01/08/21 01/08/22 Yes Pucilowski, Olgierd A, MD  famotidine (PEPCID) 20 MG tablet TAKE 1 TABLET BY MOUTH TWICE DAILY 01/08/21 01/08/22 Yes Lorin Glass, MD  Fluticasone-Salmeterol (ADVAIR) 250-50 MCG/DOSE AEPB INHALE 1 PUFF INTO THE LUNGS IN THE MORNING AND AT BEDTIME. 11/06/20 11/06/21 Yes [name], MD  furosemide (LASIX) 20 MG tablet Take 1 tablet (20 mg total) by mouth daily as needed for edema. 02/26/20  Yes Steffanie Dunn, DO  levocetirizine (XYZAL) 5 MG tablet TAKE 1 TABLET BY MOUTH EVERY MORNING 11/06/20 11/06/21 Yes [name], MD  lisdexamfetamine (VYVANSE) 40 MG capsule Take 1 capsule (40 mg total) by mouth every morning. 09/05/21 10/06/21 Yes Nelly Rout, MD  montelukast (SINGULAIR) 10 MG tablet TAKE 1 TABLET BY MOUTH AT BEDTIME 01/02/21 01/02/22 Yes Mack, Art Buff, NP  rizatriptan (MAXALT-MLT) 10 MG disintegrating tablet DISSOLVE 1 TABLET BY MOUTH AS NEEDED FOR MIGRAINE, MAY REPEAT IN 2 HOURS IF NEEDED. NO MORE THAN 30MG  IN 24 HOURS Patient taking differently: Take 10 mg by mouth daily as needed for migraine. 12/22/19  Yes Nche, 12/24/19, NP  traZODone (DESYREL) 150 MG tablet Take 1 tablet (150 mg  total) by mouth at bedtime. 09/05/21 10/05/21 Yes Nelly Rout, MD  clonazePAM (KLONOPIN) 0.5 MG tablet TAKE 1 TABLET BY MOUTH 2 TIMES DAILY FOR ANXIETY IF NEEDED 01/08/21 07/11/21  Pucilowski, Roosvelt Maser, MD  drospirenone-ethinyl estradiol (YAZ) 3-0.02 MG tablet TAKE 1 TABLET BY MOUTH DAILY. SKIP PLACEBOS AND TAKE ACTIVE PILLS CONTINUOUSLY. 08/08/20 08/08/21  Lynnea Ferrier., MD  norethindrone (MICRONOR) 0.35 MG tablet TAKE 1 TABLET BY MOUTH ONCE A  DAY Patient not taking: No sig reported 10/31/20 10/31/21  Almon Register, DO  rizatriptan (MAXALT) 10 MG tablet TAKE ONE TABLET BY MOUTH AS NEEDED FOR MIGRAINE. MAY REPEAT IN 2 HOURS IF NEEDED 01/17/20 01/16/21  Linus Salmons, MD     ALLERGIES:  Allergies  Allergen Reactions   Codeine Nausea And Vomiting   Penicillins Hives    Did it involve swelling of the face/tongue/throat, SOB, or low BP?No Did it involve sudden or severe rash/hives, skin peeling, or any reaction on the inside of your mouth or nose? Yes Did you need to seek medical attention at a hospital or doctor's office?No When did it last happen? Teenager    If all above answers are "NO", may proceed with cephalosporin use.     SOCIAL HISTORY:  Social History   Socioeconomic History   Marital status: Single    Spouse name: Not on file   Number of children: Not on file   Years of education: Not on file   Highest education level: Not on file  Occupational History   Occupation: Nurse  Tobacco Use   Smoking status: Never   Smokeless tobacco: Never  Vaping Use   Vaping Use: Never used  Substance and Sexual Activity   Alcohol use: No    Alcohol/week: 0.0 standard drinks   Drug use: No   Sexual activity: Yes    Birth control/protection: None  Other Topics Concern   Not on file  Social History Narrative   Works as a Copy.   Social Determinants of Health   Financial Resource Strain: Not on file  Food Insecurity: Not on file  Transportation Needs: Not on file  Physical Activity: Not on file  Stress: Not on file  Social Connections: Not on file  Intimate Partner Violence: Not on file     FAMILY HISTORY:  Family History  Adopted: Yes  Family history unknown: Yes    Otherwise negative.   REVIEW OF SYSTEMS:  Review of Systems  Constitutional:  Negative for chills and fever.  HENT:  Negative for congestion and sore throat.   Respiratory:  Negative for shortness of breath.    Cardiovascular:  Negative for chest pain and palpitations.  Gastrointestinal:  Positive for abdominal pain. Negative for diarrhea, nausea and vomiting.  Genitourinary:  Negative for dysuria and urgency.  All other systems reviewed and are negative.  VITAL SIGNS:  Temp:  [98.2 F (36.8 C)] 98.2 F (36.8 C) (10/27 0619) Pulse Rate:  [57-84] 61 (10/27 1015) Resp:  [15-22] 16 (10/27 1015) BP: (122-155)/(75-98) 135/79 (10/27 1015) SpO2:  [94 %-100 %] 99 % (10/27 1015) Weight:  [104.3 kg] 104.3 kg (10/27 0506)     Height: 5\' 8"  (172.7 cm) Weight: 104.3 kg BMI (Calculated): 34.98   PHYSICAL EXAM:  Physical Exam Vitals and nursing note reviewed.  Constitutional:      General: She is not in acute distress.    Appearance: Normal appearance. She is well-developed and normal weight. She is not ill-appearing.  HENT:  Head: Normocephalic and atraumatic.     Mouth/Throat:     Mouth: Mucous membranes are moist.     Pharynx: Oropharynx is clear.  Eyes:     General: No scleral icterus.    Conjunctiva/sclera: Conjunctivae normal.     Pupils: Pupils are equal, round, and reactive to light.  Cardiovascular:     Rate and Rhythm: Normal rate.     Pulses: Normal pulses.     Heart sounds: No murmur heard. Pulmonary:     Effort: Pulmonary effort is normal. No respiratory distress.     Breath sounds: Normal breath sounds.  Genitourinary:    Comments: Deferred Musculoskeletal:     Right lower leg: No edema.     Left lower leg: No edema.  Skin:    General: Skin is warm and dry.     Coloration: Skin is not jaundiced.     Findings: No erythema.  Neurological:     General: No focal deficit present.     Mental Status: She is alert and oriented to person, place, and time.  Psychiatric:        Mood and Affect: Mood normal.        Behavior: Behavior normal.    INTAKE/OUTPUT:  This shift: No intake/output data recorded.  Last 2 shifts: @IOLAST2SHIFTS @  Labs:  CBC Latest Ref Rng & Units  10/03/2021 01/13/2020 01/10/2020  WBC 4.0 - 10.5 K/uL 4.8 6.2 9.2  Hemoglobin 12.0 - 15.0 g/dL 03/09/2020 47.6 54.6  Hematocrit 36.0 - 46.0 % 39.0 38.0 40.6  Platelets 150 - 400 K/uL 241 194 159   CMP Latest Ref Rng & Units 10/03/2021 01/13/2020 01/10/2020  Glucose 70 - 99 mg/dL 03/09/2020) 98 97  BUN 6 - 20 mg/dL 16 9 11   Creatinine 0.44 - 1.00 mg/dL 546(F 6.81  Sodium 135 - 145 mmol/L 138 138 137  Potassium 3.5 - 5.1 mmol/L 3.1(L) 3.7 3.7  Chloride 98 - 111 mmol/L 106 108 105  CO2 22 - 32 mmol/L 26 19(L) 24  Calcium 8.9 - 10.3 mg/dL 9.5 9.4 9.2  Total Protein 6.5 - 8.1 g/dL 7.6 - -  Total Bilirubin 0.3 - 1.2 mg/dL 0.5 - -  Alkaline Phos 38 - 126 U/L 88 - -  AST 15 - 41 U/L 22 - -  ALT 0 - 44 U/L 15 - -     Imaging studies:   RUQ 2.75 (10/03/2021) personally reviewed which does show cholelithiasis, gallbladder is somewhat distended, no pericholecystic fluid or wall thickening, and radiologist report reviewed below;   IMPRESSION: Mildly distended gallbladder with multiple stones. Positive sonographic Murphy's sign.   Findings suspicious for acute calculus cholecystitis.   Assessment/Plan: (ICD-10's: K60.20) 45 y.o. female with epigastric pain with K93.35 concerning for possible cholecystitis; however, given her improvement in pain, 49 findings, and normal laboratory results, I do think this more likely represents symptomatic cholelithiasis. Clinical picture complicated by history of PE on Eliquis.    - Will admit to general surgery - I had a long discussion with her regarding my thought process and recommendations. Again, I do think she more likely has symptomatic cholelithiasis. She does have reservation regarding surgery given her Eliquis use, which is reasonable. I do think it is reasonable to bring her in an observe her. Tomorrow morning, if her pain remains resolved, her labs are normal, and she can tolerate PO without return of her pain, I think it is reasonable to discharge her and have her  follow up with [place],  or in [place], to be evaluated for elective cholecystectomy. If her pain returns or worsens, then we need to consider surgery this admission. She seemed understanding and agreeable with this pain.   - Okay with CLD as tolerated tonight; NPO at midnight - IV Abx (Rocephin/Flagyl) - Pain control prn; antiemetics prn - Monitor abdominal examination    - DVT prophylaxis; Hold for OR  All of the above findings and recommendations were discussed with the patient, and all of her questions were answered to her expressed satisfaction.  -- Lynden Oxford, PA-C [facility] 10/03/2021, 11:12 AM [phone] M-F: 7am - 4pm

## 2021-10-04 ENCOUNTER — Inpatient Hospital Stay: Payer: 59 | Admitting: Certified Registered"

## 2021-10-04 ENCOUNTER — Other Ambulatory Visit (HOSPITAL_COMMUNITY): Payer: Self-pay

## 2021-10-04 ENCOUNTER — Other Ambulatory Visit: Payer: Self-pay

## 2021-10-04 ENCOUNTER — Encounter
Admission: EM | Disposition: A | Discharge status: Home / Self Care | Payer: Self-pay | Source: Home / Self Care | Attending: Surgery

## 2021-10-04 ENCOUNTER — Encounter: Payer: Self-pay | Admitting: Surgery

## 2021-10-04 DIAGNOSIS — K8000 Calculus of gallbladder with acute cholecystitis without obstruction: Secondary | ICD-10-CM | POA: Diagnosis not present

## 2021-10-04 LAB — COMPREHENSIVE METABOLIC PANEL
ALT: 20 U/L (ref 0–44)
AST: 22 U/L (ref 15–41)
Albumin: 3.7 g/dL (ref 3.5–5.0)
Alkaline Phosphatase: 78 U/L (ref 38–126)
Anion gap: 5 (ref 5–15)
BUN: 11 mg/dL (ref 6–20)
CO2: 26 mmol/L (ref 22–32)
Calcium: 8.6 mg/dL — ABNORMAL LOW (ref 8.9–10.3)
Chloride: 104 mmol/L (ref 98–111)
Creatinine, Ser: 0.76 mg/dL (ref 0.44–1.00)
GFR, Estimated: >60 mL/min (ref >60)
Glucose, Bld: 114 mg/dL — ABNORMAL HIGH (ref 70–99)
Potassium: 4 mmol/L (ref 3.5–5.1)
Sodium: 135 mmol/L (ref 135–145)
Total Bilirubin: 0.8 mg/dL (ref 0.3–1.2)
Total Protein: 6.6 g/dL (ref 6.5–8.1)

## 2021-10-04 LAB — CBC
HCT: 33.4 % — ABNORMAL LOW (ref 36.0–46.0)
Hemoglobin: 11.1 g/dL — ABNORMAL LOW (ref 12.0–15.0)
MCH: 28.8 pg (ref 26.0–34.0)
MCHC: 33.2 g/dL (ref 30.0–36.0)
MCV: 86.5 fL (ref 80.0–100.0)
Platelets: 194 10*3/uL (ref 150–400)
RBC: 3.86 MIL/uL — ABNORMAL LOW (ref 3.87–5.11)
RDW: 12.9 % (ref 11.5–15.5)
WBC: 9.1 10*3/uL (ref 4.0–10.5)
nRBC: 0 % (ref 0.0–0.2)

## 2021-10-04 LAB — APTT: aPTT: 34 seconds (ref 24–36)

## 2021-10-04 LAB — PROTIME-INR
INR: 1.1 (ref 0.8–1.2)
Prothrombin Time: 14.3 seconds (ref 11.4–15.2)

## 2021-10-04 SURGERY — CHOLECYSTECTOMY, ROBOT-ASSISTED, LAPAROSCOPIC
Anesthesia: General

## 2021-10-04 MED ORDER — KETOROLAC TROMETHAMINE 30 MG/ML IJ SOLN
INTRAMUSCULAR | Status: DC | PRN
  Administered 2021-10-04: 15 mg via INTRAVENOUS

## 2021-10-04 MED ORDER — FENTANYL CITRATE PF 50 MCG/ML IJ SOSY: 50.0000 ug | PREFILLED_SYRINGE | Freq: Once | INTRAMUSCULAR | Status: AC

## 2021-10-04 MED ORDER — PROMETHAZINE HCL 25 MG/ML IJ SOLN: 6.2500 mg | INTRAMUSCULAR | Status: DC | PRN

## 2021-10-04 MED ORDER — FENTANYL CITRATE PF 50 MCG/ML IJ SOSY
50.0000 ug | PREFILLED_SYRINGE | Freq: Once | INTRAMUSCULAR | Status: AC
  Administered 2021-10-04: 50 ug via INTRAVENOUS

## 2021-10-04 MED ORDER — FENTANYL CITRATE (PF) 100 MCG/2ML IJ SOLN
INTRAMUSCULAR | Status: AC
  Filled 2021-10-04: qty 2

## 2021-10-04 MED ORDER — FENTANYL CITRATE (PF) 100 MCG/2ML IJ SOLN
INTRAMUSCULAR | Status: AC
  Administered 2021-10-04: 25 ug via INTRAVENOUS
  Filled 2021-10-04: qty 2

## 2021-10-04 MED ORDER — FENTANYL CITRATE PF 50 MCG/ML IJ SOSY
PREFILLED_SYRINGE | INTRAMUSCULAR | Status: AC
  Administered 2021-10-04: 50 ug via INTRAVENOUS
  Filled 2021-10-04: qty 1

## 2021-10-04 MED ORDER — FENTANYL CITRATE (PF) 100 MCG/2ML IJ SOLN
INTRAMUSCULAR | Status: AC
  Administered 2021-10-04: 50 ug via INTRAVENOUS
  Filled 2021-10-04: qty 2

## 2021-10-04 MED ORDER — KETOROLAC TROMETHAMINE 30 MG/ML IJ SOLN
30.0000 mg | Freq: Four times a day (QID) | INTRAMUSCULAR | Status: DC
  Administered 2021-10-04 – 2021-10-05 (×3): 30 mg via INTRAVENOUS
  Filled 2021-10-04 (×3): qty 1

## 2021-10-04 MED ORDER — PREGABALIN 50 MG PO CAPS
100.0000 mg | ORAL_CAPSULE | Freq: Three times a day (TID) | ORAL | Status: DC
  Administered 2021-10-04 – 2021-10-05 (×2): 100 mg via ORAL
  Filled 2021-10-04 (×2): qty 2

## 2021-10-04 MED ORDER — SUGAMMADEX SODIUM 200 MG/2ML IV SOLN
INTRAVENOUS | Status: DC | PRN
  Administered 2021-10-04: 200 mg via INTRAVENOUS

## 2021-10-04 MED ORDER — PROPOFOL 10 MG/ML IV BOLUS
INTRAVENOUS | Status: AC
  Filled 2021-10-04: qty 40

## 2021-10-04 MED ORDER — FENTANYL CITRATE PF 50 MCG/ML IJ SOSY
25.0000 ug | PREFILLED_SYRINGE | INTRAMUSCULAR | Status: AC | PRN
  Administered 2021-10-04 (×2): 25 ug via INTRAVENOUS

## 2021-10-04 MED ORDER — BUPIVACAINE-EPINEPHRINE (PF) 0.25% -1:200000 IJ SOLN
INTRAMUSCULAR | Status: AC
  Filled 2021-10-04: qty 30

## 2021-10-04 MED ORDER — ACETAMINOPHEN 10 MG/ML IV SOLN
INTRAVENOUS | Status: DC | PRN
  Administered 2021-10-04: 1000 mg via INTRAVENOUS

## 2021-10-04 MED ORDER — ROCURONIUM BROMIDE 10 MG/ML (PF) SYRINGE
PREFILLED_SYRINGE | INTRAVENOUS | Status: AC
  Filled 2021-10-04: qty 10

## 2021-10-04 MED ORDER — BUPIVACAINE LIPOSOME 1.3 % IJ SUSP
INTRAMUSCULAR | Status: AC
  Filled 2021-10-04: qty 20

## 2021-10-04 MED ORDER — MIDAZOLAM HCL 2 MG/2ML IJ SOLN
INTRAMUSCULAR | Status: DC | PRN
  Administered 2021-10-04: 2 mg via INTRAVENOUS

## 2021-10-04 MED ORDER — DEXMEDETOMIDINE (PRECEDEX) IN NS 20 MCG/5ML (4 MCG/ML) IV SYRINGE
PREFILLED_SYRINGE | INTRAVENOUS | Status: DC | PRN
  Administered 2021-10-04: 12 ug via INTRAVENOUS
  Administered 2021-10-04: 8 ug via INTRAVENOUS

## 2021-10-04 MED ORDER — DEXAMETHASONE SODIUM PHOSPHATE 10 MG/ML IJ SOLN
INTRAMUSCULAR | Status: DC | PRN
  Administered 2021-10-04: 10 mg via INTRAVENOUS

## 2021-10-04 MED ORDER — LIDOCAINE HCL (CARDIAC) PF 100 MG/5ML IV SOSY
PREFILLED_SYRINGE | INTRAVENOUS | Status: DC | PRN
  Administered 2021-10-04: 80 mg via INTRAVENOUS

## 2021-10-04 MED ORDER — CHLORHEXIDINE GLUCONATE 0.12 % MT SOLN: 15.0000 mL | Freq: Once | OROMUCOSAL | Status: AC

## 2021-10-04 MED ORDER — LACTATED RINGERS IV SOLN: INTRAVENOUS | Status: DC

## 2021-10-04 MED ORDER — PROPOFOL 10 MG/ML IV BOLUS
INTRAVENOUS | Status: DC | PRN
Start: 2021-10-04 — End: 2021-10-04
  Administered 2021-10-04: 200 mg via INTRAVENOUS

## 2021-10-04 MED ORDER — FENTANYL CITRATE PF 50 MCG/ML IJ SOSY
PREFILLED_SYRINGE | INTRAMUSCULAR | Status: AC
  Filled 2021-10-04: qty 1

## 2021-10-04 MED ORDER — ONDANSETRON HCL 4 MG/2ML IJ SOLN
INTRAMUSCULAR | Status: DC | PRN
  Administered 2021-10-04: 4 mg via INTRAVENOUS

## 2021-10-04 MED ORDER — 0.9 % SODIUM CHLORIDE (POUR BTL) OPTIME
TOPICAL | Status: DC | PRN
  Administered 2021-10-04: 500 mL

## 2021-10-04 MED ORDER — FENTANYL CITRATE PF 50 MCG/ML IJ SOSY: 25.0000 ug | PREFILLED_SYRINGE | INTRAMUSCULAR | Status: DC | PRN

## 2021-10-04 MED ORDER — LIDOCAINE HCL (PF) 2 % IJ SOLN
INTRAMUSCULAR | Status: AC
  Filled 2021-10-04: qty 5

## 2021-10-04 MED ORDER — ROCURONIUM BROMIDE 100 MG/10ML IV SOLN
INTRAVENOUS | Status: DC | PRN
  Administered 2021-10-04: 60 mg via INTRAVENOUS
  Administered 2021-10-04: 10 mg via INTRAVENOUS
  Administered 2021-10-04: 20 mg via INTRAVENOUS

## 2021-10-04 MED ORDER — ACETAMINOPHEN 10 MG/ML IV SOLN
INTRAVENOUS | Status: AC
  Filled 2021-10-04: qty 100

## 2021-10-04 MED ORDER — FENTANYL CITRATE (PF) 100 MCG/2ML IJ SOLN
25.0000 ug | INTRAMUSCULAR | Status: DC | PRN
  Administered 2021-10-04 (×3): 25 ug via INTRAVENOUS

## 2021-10-04 MED ORDER — PROPOFOL 500 MG/50ML IV EMUL
INTRAVENOUS | Status: DC | PRN
  Administered 2021-10-04: 50 ug/kg/min via INTRAVENOUS

## 2021-10-04 MED ORDER — MIDAZOLAM HCL 2 MG/2ML IJ SOLN
INTRAMUSCULAR | Status: AC
  Filled 2021-10-04: qty 2

## 2021-10-04 MED ORDER — FENTANYL CITRATE (PF) 100 MCG/2ML IJ SOLN
INTRAMUSCULAR | Status: DC | PRN
  Administered 2021-10-04 (×2): 50 ug via INTRAVENOUS

## 2021-10-04 MED ORDER — POTASSIUM CHLORIDE IN NACL 20-0.9 MEQ/L-% IV SOLN
INTRAVENOUS | Status: DC
  Filled 2021-10-04 (×3): qty 1000

## 2021-10-04 MED ORDER — MEPERIDINE HCL 25 MG/ML IJ SOLN: 6.2500 mg | INTRAMUSCULAR | Status: DC | PRN

## 2021-10-04 MED ORDER — BUPIVACAINE-EPINEPHRINE 0.25% -1:200000 IJ SOLN
INTRAMUSCULAR | Status: DC | PRN
  Administered 2021-10-04: 50 mL via SURGICAL_CAVITY

## 2021-10-04 MED ORDER — CHLORHEXIDINE GLUCONATE 0.12 % MT SOLN
OROMUCOSAL | Status: AC
  Administered 2021-10-04: 15 mL via OROMUCOSAL
  Filled 2021-10-04: qty 15

## 2021-10-04 SURGICAL SUPPLY — 53 items
ADH SKN CLS APL DERMABOND .7 (GAUZE/BANDAGES/DRESSINGS) ×1
APL PRP STRL LF DISP 70% ISPRP (MISCELLANEOUS) ×1
BAG SPEC RTRVL LRG 6X4 10 (ENDOMECHANICALS) ×1
CANNULA REDUC XI 12-8 STAPL (CANNULA) ×2
CANNULA REDUCER 12-8 DVNC XI (CANNULA) ×1 IMPLANT
CHLORAPREP W/TINT 26 (MISCELLANEOUS) ×2 IMPLANT
CLIP LIGATING HEMO O LOK GREEN (MISCELLANEOUS) ×2 IMPLANT
DECANTER SPIKE VIAL GLASS SM (MISCELLANEOUS) ×2 IMPLANT
DEFOGGER SCOPE WARMER CLEARIFY (MISCELLANEOUS) ×2 IMPLANT
DERMABOND ADVANCED (GAUZE/BANDAGES/DRESSINGS) ×1
DERMABOND ADVANCED .7 DNX12 (GAUZE/BANDAGES/DRESSINGS) ×1 IMPLANT
DRAPE ARM DVNC X/XI (DISPOSABLE) ×4 IMPLANT
DRAPE COLUMN DVNC XI (DISPOSABLE) ×1 IMPLANT
DRAPE DA VINCI XI ARM (DISPOSABLE) ×8
DRAPE DA VINCI XI COLUMN (DISPOSABLE) ×2
ELECT CAUTERY BLADE 6.4 (BLADE) ×2 IMPLANT
ELECT REM PT RETURN 9FT ADLT (ELECTROSURGICAL) ×2
ELECTRODE REM PT RTRN 9FT ADLT (ELECTROSURGICAL) ×1 IMPLANT
GAUZE 4X4 16PLY ~~LOC~~+RFID DBL (SPONGE) ×2 IMPLANT
GLOVE SURG ENC MOIS LTX SZ7 (GLOVE) ×4 IMPLANT
GOWN STRL REUS W/ TWL LRG LVL3 (GOWN DISPOSABLE) ×4 IMPLANT
GOWN STRL REUS W/TWL LRG LVL3 (GOWN DISPOSABLE) ×8
IRRIGATION STRYKERFLOW (MISCELLANEOUS) IMPLANT
IRRIGATOR STRYKERFLOW (MISCELLANEOUS) ×2
IV NS 1000ML (IV SOLUTION) ×2
IV NS 1000ML BAXH (IV SOLUTION) IMPLANT
KIT PINK PAD W/HEAD ARE REST (MISCELLANEOUS) ×2 IMPLANT
KIT PINK PAD W/HEAD ARM REST (MISCELLANEOUS) ×1 IMPLANT
LABEL OR SOLS (LABEL) ×2 IMPLANT
MANIFOLD NEPTUNE II (INSTRUMENTS) ×2 IMPLANT
NEEDLE HYPO 22GX1.5 SAFETY (NEEDLE) ×2 IMPLANT
NS IRRIG 500ML POUR BTL (IV SOLUTION) ×2 IMPLANT
OBTURATOR OPTICAL STANDARD 8MM (TROCAR) ×2
OBTURATOR OPTICAL STND 8 DVNC (TROCAR) ×1
OBTURATOR OPTICALSTD 8 DVNC (TROCAR) ×1 IMPLANT
PACK LAP CHOLECYSTECTOMY (MISCELLANEOUS) ×2 IMPLANT
PENCIL ELECTRO HAND CTR (MISCELLANEOUS) ×2 IMPLANT
POUCH SPECIMEN RETRIEVAL 10MM (ENDOMECHANICALS) ×2 IMPLANT
SEAL CANN UNIV 5-8 DVNC XI (MISCELLANEOUS) ×3 IMPLANT
SEAL XI 5MM-8MM UNIVERSAL (MISCELLANEOUS) ×6
SET TUBE SMOKE EVAC HIGH FLOW (TUBING) ×2 IMPLANT
SOLUTION ELECTROLUBE (MISCELLANEOUS) ×2 IMPLANT
SPONGE T-LAP 18X18 ~~LOC~~+RFID (SPONGE) ×2 IMPLANT
SPONGE T-LAP 4X18 ~~LOC~~+RFID (SPONGE) IMPLANT
STAPLER CANNULA SEAL DVNC XI (STAPLE) ×1 IMPLANT
STAPLER CANNULA SEAL XI (STAPLE) ×2
SUT MNCRL AB 4-0 PS2 18 (SUTURE) ×2 IMPLANT
SUT VICRYL 0 AB UR-6 (SUTURE) ×4 IMPLANT
SYR 20ML LL LF (SYRINGE) ×2 IMPLANT
SYR 30ML LL (SYRINGE) ×2 IMPLANT
TAPE TRANSPORE STRL 2 31045 (GAUZE/BANDAGES/DRESSINGS) ×2 IMPLANT
TROCAR BALLN GELPORT 12X130M (ENDOMECHANICALS) ×2 IMPLANT
WATER STERILE IRR 500ML POUR (IV SOLUTION) ×2 IMPLANT

## 2021-10-04 NOTE — Progress Notes (Signed)
Pt seen and examined. Persistent Pain , nausea and migraine. For Rob chole this am. All questions answered

## 2021-10-04 NOTE — Anesthesia Postprocedure Evaluation (Signed)
Anesthesia Post Note  Patient: Sherri Miller  Procedure(s) Performed: XI ROBOTIC ASSISTED LAPAROSCOPIC CHOLECYSTECTOMY INDOCYANINE GREEN FLUORESCENCE IMAGING (ICG)  Patient location during evaluation: PACU Anesthesia Type: General Level of consciousness: awake and alert and oriented Pain management: pain level controlled Vital Signs Assessment: post-procedure vital signs reviewed and stable Respiratory status: spontaneous breathing, nonlabored ventilation and respiratory function stable Cardiovascular status: blood pressure returned to baseline and stable Postop Assessment: no signs of nausea or vomiting Anesthetic complications: no   No notable events documented.   Last Vitals:  Vitals:   10/04/21 1445 10/04/21 1500  BP: 105/70 108/68  Pulse: 61 61  Resp: (!) 21 17  Temp:  (!) 36.3 C  SpO2: 93% 97%    Last Pain:  Vitals:   10/04/21 1500  TempSrc:   PainSc: 5                  Ara Mano

## 2021-10-04 NOTE — Transfer of Care (Signed)
Immediate Anesthesia Transfer of Care Note  Patient: Sherri Miller  Procedure(s) Performed: XI ROBOTIC ASSISTED LAPAROSCOPIC CHOLECYSTECTOMY INDOCYANINE GREEN FLUORESCENCE IMAGING (ICG)  Patient Location: PACU  Anesthesia Type:General  Level of Consciousness: awake, alert  and oriented  Airway & Oxygen Therapy: Patient Spontanous Breathing and Patient connected to face mask oxygen  Post-op Assessment: Report given to RN and Post -op Vital signs reviewed and stable  Post vital signs: Reviewed and stable  Last Vitals:  Vitals Value Taken Time  BP 110/68 10/04/21 1330  Temp    Pulse 61 10/04/21 1331  Resp 20 10/04/21 1331  SpO2 100 % 10/04/21 1331  Vitals shown include unvalidated device data.  Last Pain:  Vitals:   10/04/21 1030  TempSrc: Oral  PainSc: 5          Complications: No notable events documented.

## 2021-10-04 NOTE — Op Note (Signed)
Robotic assisted laparoscopic Cholecystectomy  Pre-operative Diagnosis: Acute cholecystitis  Post-operative Diagnosis: same  Procedure:  Robotic assisted laparoscopic Cholecystectomy  Surgeon: Sterling Big, MD FACS  Anesthesia: Gen. with endotracheal tube  Findings: Severe acute Cholecystitis w early gangrenous changes within serosa surface  Estimated Blood Loss: 10cc       Specimens: Gallbladder           Complications: none   Procedure Details  The patient was seen again in the Holding Room. The benefits, complications, treatment options, and expected outcomes were discussed with the patient. The risks of bleeding, infection, recurrence of symptoms, failure to resolve symptoms, bile duct damage, bile duct leak, retained common bile duct stone, bowel injury, any of which could require further surgery and/or ERCP, stent, or papillotomy were reviewed with the patient. The likelihood of improving the patient's symptoms with return to their baseline status is good.  The patient and/or family concurred with the proposed plan, giving informed consent.  The patient was taken to Operating Room, identified  and the procedure verified as Laparoscopic Cholecystectomy.  A Time Out was held and the above information confirmed.  Prior to the induction of general anesthesia, antibiotic prophylaxis was administered. VTE prophylaxis was in place. General endotracheal anesthesia was then administered and tolerated well. After the induction, the abdomen was prepped with Chloraprep and draped in the sterile fashion. The patient was positioned in the supine position.  Cut down technique was used to enter the abdominal cavity and a Hasson trochar was placed after two vicryl stitches were anchored to the fascia. Pneumoperitoneum was then created with CO2 and tolerated well without any adverse changes in the patient's vital signs.  Three 8-mm ports were placed under direct vision.  The patient was positioned   in reverse Trendelenburg, robot was brought to the surgical field and docked in the standard fashion.  We made sure all the instrumentation was kept indirect view at all times and that there were no collision between the arms. I scrubbed out and went to the console.  The gallbladder was identified, we observed severe acute Cholecystitis w early gangrenous changes within the serosa surface of the GB. The fundus grasped and retracted cephalad. Adhesions were lysed bluntly. The infundibulum was grasped and retracted laterally, exposing the peritoneum overlying the triangle of Calot. This was then divided and exposed in a blunt fashion. An extended critical view of the cystic duct and cystic artery was obtained.  The cystic duct was clearly identified and bluntly dissected.   Artery and duct were double clipped and divided. Using ICG cholangiography we visualize the cystic duct and CBD no evidence of bile injuries observed. The gallbladder was taken from the gallbladder fossa in a retrograde fashion with the electrocautery. There was small tear on the wall of the GB due to severe inflammatory response. GB was placed in the endocath bag. Hemostasis was achieved with the electrocautery. nspection of the right upper quadrant was performed. No bleeding, bile duct injury or leak, or bowel injury was noted. I irrigated with normal saline until return was clear. Robotic instruments and robotic arms were undocked in the standard fashion.  I scrubbed back in.  The gallbladder was removed. All skin incisions  were infiltrated with liposomal marcaine and 1/4 marcaine w epi..   Pneumoperitoneum was released.  The periumbilical port site was closed with interrumpted 0 Vicryl sutures. 4-0 subcuticular Monocryl was used to close the skin. Dermabond was  applied.  The patient was then extubated and brought  to the recovery room in stable condition. Sponge, lap, and needle counts were correct at closure and at the conclusion  of the case.               Sterling Big, MD, FACS

## 2021-10-04 NOTE — Anesthesia Procedure Notes (Signed)
Procedure Name: Intubation Date/Time: 10/04/2021 11:42 AM Performed by: Gilford Raid, CRNA Pre-anesthesia Checklist: Patient identified, Patient being monitored, Timeout performed, Emergency Drugs available and Suction available Patient Re-evaluated:Patient Re-evaluated prior to induction Oxygen Delivery Method: Circle system utilized Preoxygenation: Pre-oxygenation with 100% oxygen Induction Type: IV induction Ventilation: Mask ventilation without difficulty Laryngoscope Size: Miller and 2 Grade View: Grade I Tube type: Oral Tube size: 7.0 mm Number of attempts: 1 Placement Confirmation: ETT inserted through vocal cords under direct vision, positive ETCO2 and breath sounds checked- equal and bilateral Secured at: 21 cm Tube secured with: Tape Dental Injury: Teeth and Oropharynx as per pre-operative assessment

## 2021-10-04 NOTE — Anesthesia Preprocedure Evaluation (Signed)
Anesthesia Evaluation  Patient identified by MRN, date of birth, ID band Patient awake    Reviewed: Allergy & Precautions, NPO status , Patient's Chart, lab work & pertinent test results  History of Anesthesia Complications Negative for: history of anesthetic complications  Airway Mallampati: II  TM Distance: >3 FB Neck ROM: Full    Dental no notable dental hx.    Pulmonary asthma ,    breath sounds clear to auscultation- rhonchi (-) wheezing      Cardiovascular (-) hypertension(-) CAD, (-) Past MI, (-) Cardiac Stents and (-) CABG  Rhythm:Regular Rate:Normal - Systolic murmurs and - Diastolic murmurs    Neuro/Psych  Headaches, neg Seizures PSYCHIATRIC DISORDERS Anxiety Depression    GI/Hepatic negative GI ROS, Neg liver ROS,   Endo/Other  negative endocrine ROSneg diabetes  Renal/GU negative Renal ROS     Musculoskeletal  (+) Arthritis ,   Abdominal (+) + obese,   Peds  Hematology negative hematology ROS (+)   Anesthesia Other Findings Past Medical History: No date: Allergy No date: Asthma No date: Chicken pox No date: Depression No date: Migraine No date: Migraines No date: Pulmonary embolism (HCC) 01/2020: Pulmonary embolism (HCC)   Reproductive/Obstetrics                             Anesthesia Physical Anesthesia Plan  ASA: 2  Anesthesia Plan: General   Post-op Pain Management:    Induction: Intravenous  PONV Risk Score and Plan: 2 and Ondansetron, Dexamethasone and Midazolam  Airway Management Planned: Oral ETT  Additional Equipment:   Intra-op Plan:   Post-operative Plan: Extubation in OR  Informed Consent: I have reviewed the patients History and Physical, chart, labs and discussed the procedure including the risks, benefits and alternatives for the proposed anesthesia with the patient or authorized representative who has indicated his/her understanding and  acceptance.     Dental advisory given  Plan Discussed with: CRNA and Anesthesiologist  Anesthesia Plan Comments:         Anesthesia Quick Evaluation

## 2021-10-05 LAB — COMPREHENSIVE METABOLIC PANEL
ALT: 51 U/L — ABNORMAL HIGH (ref 0–44)
AST: 48 U/L — ABNORMAL HIGH (ref 15–41)
Albumin: 3.5 g/dL (ref 3.5–5.0)
Alkaline Phosphatase: 76 U/L (ref 38–126)
Anion gap: 6 (ref 5–15)
BUN: 12 mg/dL (ref 6–20)
CO2: 23 mmol/L (ref 22–32)
Calcium: 8.6 mg/dL — ABNORMAL LOW (ref 8.9–10.3)
Chloride: 105 mmol/L (ref 98–111)
Creatinine, Ser: 0.9 mg/dL (ref 0.44–1.00)
GFR, Estimated: >60 mL/min (ref >60)
Glucose, Bld: 125 mg/dL — ABNORMAL HIGH (ref 70–99)
Potassium: 4.4 mmol/L (ref 3.5–5.1)
Sodium: 134 mmol/L — ABNORMAL LOW (ref 135–145)
Total Bilirubin: 0.4 mg/dL (ref 0.3–1.2)
Total Protein: 6.3 g/dL — ABNORMAL LOW (ref 6.5–8.1)

## 2021-10-05 LAB — CBC
HCT: 30.8 % — ABNORMAL LOW (ref 36.0–46.0)
Hemoglobin: 10.5 g/dL — ABNORMAL LOW (ref 12.0–15.0)
MCH: 29.8 pg (ref 26.0–34.0)
MCHC: 34.1 g/dL (ref 30.0–36.0)
MCV: 87.5 fL (ref 80.0–100.0)
Platelets: 172 10*3/uL (ref 150–400)
RBC: 3.52 MIL/uL — ABNORMAL LOW (ref 3.87–5.11)
RDW: 12.7 % (ref 11.5–15.5)
WBC: 13.1 10*3/uL — ABNORMAL HIGH (ref 4.0–10.5)
nRBC: 0 % (ref 0.0–0.2)

## 2021-10-05 MED ORDER — OXYCODONE HCL 5 MG PO TABS: 5.0000 mg | ORAL_TABLET | ORAL | 0 refills | Status: DC | PRN

## 2021-10-05 NOTE — Discharge Instructions (Addendum)
  Diet: Resume home heart healthy regular diet.   Activity: No heavy lifting >20 pounds (children, pets, laundry, garbage) or strenuous activity until follow-up, but light activity and walking are encouraged. Do not drive or drink alcohol if taking narcotic pain medications.  Wound care: May shower with soapy water and pat dry (do not rub incisions), but no baths or submerging incision underwater until follow-up. (no swimming)   Medications: Resume all home medications. For mild to moderate pain: acetaminophen (Tylenol) or ibuprofen (if no kidney disease). Combining Tylenol with alcohol can substantially increase your risk of causing liver disease. Narcotic pain medications, if prescribed, can be used for severe pain, though may cause nausea, constipation, and drowsiness. If you do not need the narcotic pain medication, you do not need to fill the prescription.  May restart Eliquis today.  Call office [phone]) at any time if any questions, worsening pain, fevers/chills, bleeding, drainage from incision site, or other concerns.

## 2021-10-05 NOTE — Plan of Care (Signed)

## 2021-10-05 NOTE — Discharge Summary (Signed)
Patient ID: Sherri Miller MRN: [id number] DOB/AGE: 15-Apr-1976 45 y.o.  Admit date: 10/03/2021 Discharge date: 10/05/2021   Discharge Diagnoses:  Principal Problem:   Symptomatic cholelithiasis   Procedures: Robotic assisted laparoscopic cholecystectomy  Hospital Course: Patient admitted with symptomatic cholelithiasis.  Patient underwent robotic assisted laparoscopic cholecystectomy.  She was found with acute cholecystitis.  She was admitted for observation due to cholecystitis and pain management.  She has been recovering adequately.  Pain well controlled.  Patient tolerating diet.  Patient ambulating.  Wounds dry and clean.  Physical Exam HENT:     Head: Normocephalic.  Cardiovascular:     Rate and Rhythm: Normal rate and regular rhythm.  Pulmonary:     Effort: Pulmonary effort is normal.  Abdominal:     General: Bowel sounds are normal.     Palpations: Abdomen is soft.  Skin:    General: Skin is warm.  Neurological:     General: No focal deficit present.     Mental Status: She is alert.   Consults: None  Disposition: Discharge disposition: 01-Home or Self Care       Discharge Instructions     Diet - low sodium heart healthy   Complete by: As directed       Allergies as of 10/05/2021       Reactions   Codeine Nausea And Vomiting   Penicillins Hives   Did it involve swelling of the face/tongue/throat, SOB, or low BP?No Did it involve sudden or severe rash/hives, skin peeling, or any reaction on the inside of your mouth or nose? Yes Did you need to seek medical attention at a hospital or doctor's office?No When did it last happen? Teenager    If all above answers are "NO", may proceed with cephalosporin use.   Latex Rash        Medication List     TAKE these medications    Advair Diskus 250-50 MCG/ACT Aepb Generic drug: fluticasone-salmeterol INHALE 1 PUFF INTO THE LUNGS IN THE MORNING AND AT BEDTIME.   albuterol 108 (90 Base) MCG/ACT  inhaler Commonly known as: VENTOLIN HFA INHALE 2 PUFFS INTO THE LUNGS EVERY 6 HOURS AS NEEDED FOR WHEEZING OR SHORTNESS OF BREATH.   clonazePAM 0.5 MG tablet Commonly known as: KLONOPIN TAKE 1 TABLET BY MOUTH 2 TIMES DAILY FOR ANXIETY IF NEEDED   DULoxetine 60 MG capsule Commonly known as: CYMBALTA TAKE 2 CAPSULES BY MOUTH DAILY   Eliquis 5 MG Tabs tablet Generic drug: apixaban TAKE 1 TABLET BY MOUTH TWICE DAILY   famotidine 20 MG tablet Commonly known as: PEPCID TAKE 1 TABLET BY MOUTH TWICE DAILY   furosemide 20 MG tablet Commonly known as: Lasix Take 1 tablet (20 mg total) by mouth daily as needed for edema.   [name] 3-0.02 MG tablet Generic drug: drospirenone-ethinyl estradiol TAKE 1 TABLET BY MOUTH DAILY. SKIP PLACEBOS AND TAKE ACTIVE PILLS CONTINUOUSLY.   levocetirizine 5 MG tablet Commonly known as: XYZAL TAKE 1 TABLET BY MOUTH EVERY MORNING   montelukast 10 MG tablet Commonly known as: SINGULAIR TAKE 1 TABLET BY MOUTH AT BEDTIME   norethindrone 0.35 MG tablet Commonly known as: MICRONOR TAKE 1 TABLET BY MOUTH ONCE A DAY   oxyCODONE 5 MG immediate release tablet Commonly known as: Oxy IR/ROXICODONE Take 1 tablet (5 mg total) by mouth every 4 (four) hours as needed for moderate pain.   rizatriptan 10 MG disintegrating tablet Commonly known as: MAXALT-MLT DISSOLVE 1 TABLET BY MOUTH AS NEEDED FOR MIGRAINE, MAY  REPEAT IN 2 HOURS IF NEEDED. NO MORE THAN 30MG  IN 24 HOURS What changed: See the new instructions.   rizatriptan 10 MG tablet Commonly known as: MAXALT TAKE ONE TABLET BY MOUTH AS NEEDED FOR MIGRAINE. MAY REPEAT IN 2 HOURS IF NEEDED What changed: Another medication with the same name was changed. Make sure you understand how and when to take each.   traZODone 150 MG tablet Commonly known as: DESYREL Take 1 tablet (150 mg total) by mouth at bedtime.   Vyvanse 40 MG capsule Generic drug: lisdexamfetamine Take 1 capsule (40 mg total) by mouth every  morning.        Follow-up Information     Pabon, F, MD Follow up in 2 week(s).   Specialty: General Surgery Contact information: [address] [place] [phone]                This discharge encounter was more than 30-minute most of the time counseling the patient and coordinating plan of care.

## 2021-10-07 ENCOUNTER — Other Ambulatory Visit: Payer: Self-pay | Admitting: *Deleted

## 2021-10-07 ENCOUNTER — Encounter: Payer: Self-pay | Admitting: *Deleted

## 2021-10-07 LAB — SURGICAL PATHOLOGY

## 2021-10-07 NOTE — Patient Outreach (Signed)
[facility]) Care Management  10/07/2021  Sherri Miller 1976/09/02 [id number]  Transition of care call/case closure   Referral received:10/04/21 Initial outreach:10/07/21 Insurance: [place] [employer]    Subjective: Initial successful telephone call to patient's preferred number in order to complete transition of care assessment; 2 HIPAA identifiers verified. Explained purpose of call and completed transition of care assessment.  Sherri Miller discussed feeling like crap, she denies post-operative problems, says surgical incisions are unremarkable, states surgical pain well managed with prescribed medications. 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 She reports tolerating mobility in home, working deep breathing exercises.  Patient mother is able to assist in her recovery.   Reviewed accessing the following [employer] : She denies need a referral to one of the [place] chronic disease management programs.  She does  not have the hospital indemnity plan, she plans to file with [employer] plan.  She uses a [facility] outpatient pharmacy at [employer].  Objective:  Sherri Miller was hospitalized at [facility] from 10/27-10/29/22 for Acute Cholecysthiasis , laparoscopic cholecystectomy  Comorbidities include:  Pulmonary Embolus, depression, asthma . She was discharged to home on 10/05/21  without the need for home health services or DME.   Assessment:  Patient voices good understanding of all discharge instructions.  See transition of care flowsheet for assessment details.   Plan:  Reviewed hospital discharge diagnosis of  Laparoscopic cholecystectomy and discharge treatment plan using hospital discharge instructions, assessing medication adherence, reviewing problems requiring  provider notification, and discussing the importance of follow up with surgeon, primary care provider and/or specialists as directed.  Reviewed [place] healthy lifestyle program information to earn healthy premium rate for  2024  :  Step 1: Create account at [email or web address]. Step 2: Complete your health assessment and annual physical by September 1. 2023.  For questions call [phone]   No ongoing care management needs identified so will close case to [facility] Care Management services and route successful outreach letter with [facility] Care Management pamphlet and 24 Hour Nurse Line Magnet to [employer] Care Management clinical pool to be mailed to patient's home address.  Thanked patient for their services to [facility].  Egbert Garibaldi, RN, BSN  [facility] Care Management,Care Management Coordinator  [phone] Mobile [phone]- Toll Free Main Office

## 2021-10-08 ENCOUNTER — Other Ambulatory Visit: Payer: Self-pay | Admitting: *Deleted

## 2021-10-08 NOTE — Patient Outreach (Signed)
[facility]) Care Management  10/08/2021  Sherri Miller 1976/10/26 [id number]   EMMI red alert flag  Referral received : 10/08/21 Day#:1 Date: 10/07/21 Red Alert Reason:Scheduled a follow up appointment ? NO    Placed return call to patient , explained reason of the call, emmi transition of care red alert flag. Patient states that she now has a post discharge appointment scheduled for 10/21/21.  Patient denies new concerns at this time.    Plan Case will remain closed to  [facility] care management .    Egbert Garibaldi, RN, BSN  [facility] Care Management,Care Management Coordinator  [phone]- Mobile [phone]- Toll Free Main Office

## 2021-10-15 ENCOUNTER — Other Ambulatory Visit: Payer: Self-pay

## 2021-10-15 ENCOUNTER — Encounter: Payer: Self-pay | Admitting: Physician Assistant

## 2021-10-15 ENCOUNTER — Ambulatory Visit (INDEPENDENT_AMBULATORY_CARE_PROVIDER_SITE_OTHER): Payer: 59 | Admitting: Physician Assistant

## 2021-10-15 VITALS — BP 133/87 | HR 84 | Temp 99.0°F | Ht 68.0 in | Wt 232.0 lb

## 2021-10-15 DIAGNOSIS — Z09 Encounter for follow-up examination after completed treatment for conditions other than malignant neoplasm: Secondary | ICD-10-CM

## 2021-10-15 DIAGNOSIS — K8020 Calculus of gallbladder without cholecystitis without obstruction: Secondary | ICD-10-CM

## 2021-10-15 NOTE — Patient Instructions (Addendum)
If you have any concerns or questions, please feel free to call our office. You can return to work with Restrictions of no lifting greater than 20lbs. (10/21/21)  GENERAL POST-OPERATIVE PATIENT INSTRUCTIONS   WOUND CARE INSTRUCTIONS:  Keep a dry clean dressing on the wound if there is drainage. The initial bandage may be removed after 24 hours.  Once the wound has quit draining you may leave it open to air.  If clothing rubs against the wound or causes irritation and the wound is not draining you may cover it with a dry dressing during the daytime.  Try to keep the wound dry and avoid ointments on the wound unless directed to do so.  If the wound becomes bright red and painful or starts to drain infected material that is not clear, please contact your physician immediately.  If the wound is mildly pink and has a thick firm ridge underneath it, this is normal, and is referred to as a healing ridge.  This will resolve over the next 4-6 weeks.  BATHING: You may shower if you have been informed of this by your surgeon. However, Please do not submerge in a tub, hot tub, or pool until incisions are completely sealed or have been told by your surgeon that you may do so.  DIET:  You may eat any foods that you can tolerate.  It is a good idea to eat a high fiber diet and take in plenty of fluids to prevent constipation.  If you do become constipated you may want to take a mild laxative or take ducolax tablets on a daily basis until your bowel habits are regular.  Constipation can be very uncomfortable, along with straining, after recent surgery.  ACTIVITY:  You are encouraged to cough and deep breath or use your incentive spirometer if you were given one, every 15-30 minutes when awake.  This will help prevent respiratory complications and low grade fevers post-operatively if you had a general anesthetic.  You may want to hug a pillow when coughing and sneezing to add additional support to the surgical area, if  you had abdominal or chest surgery, which will decrease pain during these times.  You are encouraged to walk and engage in light activity for the next two weeks.  You should not lift more than 20 pounds, until 11/15/2021 as it could put you at increased risk for complications.  Twenty pounds is roughly equivalent to a plastic bag of groceries. At that time- Listen to your body when lifting, if you have pain when lifting, stop and then try again in a few days. Soreness after doing exercises or activities of daily living is normal as you get back in to your normal routine.  MEDICATIONS:  Try to take narcotic medications and anti-inflammatory medications, such as tylenol, ibuprofen, naprosyn, etc., with food.  This will minimize stomach upset from the medication.  Should you develop nausea and vomiting from the pain medication, or develop a rash, please discontinue the medication and contact your physician.  You should not drive, make important decisions, or operate machinery when taking narcotic pain medication.  SUNBLOCK Use sun block to incision area over the next year if this area will be exposed to sun. This helps decrease scarring and will allow you avoid a permanent darkened area over your incision.   Gallbladder Eating Plan  If you have a gallbladder condition, you may have trouble digesting fats. Eating a low-fat diet can help reduce your symptoms, and may be  helpful before and after having surgery to remove your gallbladder (cholecystectomy). Your health care provider may recommend that you work with a diet and nutrition specialist (dietitian) to help you reduce the amount of fat in your diet. What are tips for following this plan? General guidelines Limit your fat intake to less than 30% of your total daily calories. If you eat around 1,800 calories each day, this is less than 60 grams (g) of fat per day. Fat is an important part of a healthy diet. Eating a low-fat diet can make it hard to  maintain a healthy body weight. Ask your dietitian how much fat, calories, and other nutrients you need each day. Eat small, frequent meals throughout the day instead of three large meals. Drink at least 8-10 cups of fluid a day. Drink enough fluid to keep your urine clear or pale yellow. Limit alcohol intake to no more than 1 drink a day for nonpregnant women and 2 drinks a day for men. One drink equals 12 oz of beer, 5 oz of wine, or 1 oz of hard liquor. Reading food labels  Check Nutrition Facts on food labels for the amount of fat per serving. Choose foods with less than 3 grams of fat per serving. Shopping Choose nonfat and low-fat healthy foods. Look for the words "nonfat," "low fat," or "fat free." Avoid buying processed or prepackaged foods. Cooking Cook using low-fat methods, such as baking, broiling, grilling, or boiling. Cook with small amounts of healthy fats, such as olive oil, grapeseed oil, canola oil, or sunflower oil. What foods are recommended? All fresh, frozen, or canned fruits and vegetables. Whole grains. Low-fat or non-fat (skim) milk and yogurt. Lean meat, skinless poultry, fish, eggs, and beans. Low-fat protein supplement powders or drinks. Spices and herbs. What foods are not recommended? High-fat foods. These include baked goods, fast food, fatty cuts of meat, ice cream, french toast, sweet rolls, pizza, cheese bread, foods covered with butter, creamy sauces, or cheese. Fried foods. These include french fries, tempura, battered fish, breaded chicken, fried breads, and sweets. Foods with strong odors. Foods that cause bloating and gas. Summary A low-fat diet can be helpful if you have a gallbladder condition, or before and after gallbladder surgery. Limit your fat intake to less than 30% of your total daily calories. This is about 60 g of fat if you eat 1,800 calories each day. Eat small, frequent meals throughout the day instead of three large meals. This  information is not intended to replace advice given to you by your health care provider. Make sure you discuss any questions you have with your health care provider. Document Revised: 07/06/2020 Document Reviewed: 07/12/2020 Elsevier Patient Education  2022 [employer].

## 2021-10-15 NOTE — Progress Notes (Signed)
[facility] POST-OP OFFICE VISIT  10/15/2021  HPI: Sherri Miller is a 45 y.o. female 11 days s/p robotic assisted laparoscopic cholecystectomy for acute cholecystitis with Dr Everlene Farrier.   She is doing well at home Some intermittent diarrhea; no association with what she eats No fever, chills, nausea, emesis She is tolerating PO She is anxious to return to work; she is a Hotel manager No other complaints   Vital signs: BP 133/87   Pulse 84   Temp 99 F (37.2 C)   Ht 5\' 8"  (1.727 m)   Wt 232 lb (105.2 kg)   SpO2 98%   BMI 35.28 kg/m    Physical Exam: Constitutional: Well appearing female, NAD Abdomen: Soft, non-tender, non-distended, no rebound/guarding Skin: Laparoscopic incisions are healing well, there is expected ecchymosis given her anticoagulation, no erythema or drainage   Assessment/Plan: This is a 45 y.o. female 11 days s/p robotic assisted laparoscopic cholecystectomy for acute cholecystitis   - Pain control prn  - Reviewed wound care  - Reviewed restrictions; return to work note given  - Reviewed surgical pathology: Cholelithiasis and some areas of gangrenous cholecystitis  - She can follow up on as needed basis; She understands to call with questions/concerns   -- 48, PA-C [facility] 10/15/2021, 10:51 AM [phone] M-F: 7am - 4pm

## 2021-10-16 ENCOUNTER — Telehealth: Payer: Self-pay | Admitting: *Deleted

## 2021-10-16 NOTE — Telephone Encounter (Signed)
Faxed FMLA to [employer] at [phone] 

## 2021-10-21 ENCOUNTER — Encounter: Payer: 59 | Admitting: Physician Assistant

## 2021-10-24 ENCOUNTER — Telehealth: Payer: Self-pay | Admitting: Surgery

## 2021-10-24 ENCOUNTER — Encounter: Payer: Self-pay | Admitting: Nurse Practitioner

## 2021-10-24 NOTE — Telephone Encounter (Signed)
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  Sherri Miller has been in contact w/her leave specialists & updated paperwork should be sent over for completion supporting her extension of time uot of work.  Thank you

## 2021-11-13 ENCOUNTER — Other Ambulatory Visit (HOSPITAL_COMMUNITY): Payer: Self-pay

## 2021-11-13 DIAGNOSIS — R5383 Other fatigue: Secondary | ICD-10-CM | POA: Diagnosis not present

## 2021-11-13 DIAGNOSIS — Z1159 Encounter for screening for other viral diseases: Secondary | ICD-10-CM | POA: Diagnosis not present

## 2021-11-13 DIAGNOSIS — Z0000 Encounter for general adult medical examination without abnormal findings: Secondary | ICD-10-CM | POA: Diagnosis not present

## 2021-11-13 DIAGNOSIS — D539 Nutritional anemia, unspecified: Secondary | ICD-10-CM | POA: Diagnosis not present

## 2021-11-13 DIAGNOSIS — Z86711 Personal history of pulmonary embolism: Secondary | ICD-10-CM | POA: Diagnosis not present

## 2021-11-13 DIAGNOSIS — Z1339 Encounter for screening examination for other mental health and behavioral disorders: Secondary | ICD-10-CM | POA: Diagnosis not present

## 2021-11-13 DIAGNOSIS — Z7251 High risk heterosexual behavior: Secondary | ICD-10-CM | POA: Diagnosis not present

## 2021-11-13 DIAGNOSIS — Z1331 Encounter for screening for depression: Secondary | ICD-10-CM | POA: Diagnosis not present

## 2021-11-13 DIAGNOSIS — E559 Vitamin D deficiency, unspecified: Secondary | ICD-10-CM | POA: Diagnosis not present

## 2021-11-13 LAB — MAGNESIUM: Magnesium: 2.01

## 2021-11-13 LAB — T3: T3, Total: 107.97

## 2021-11-13 LAB — T4, FREE: Free T4: 0.89

## 2021-11-13 LAB — PTH, INTACT: PTH, Intact: 33.9

## 2021-11-13 LAB — FOLATE: Folate: 18.17

## 2021-11-13 MED ORDER — DULOXETINE HCL 60 MG PO CPEP
60.0000 mg | ORAL_CAPSULE | Freq: Two times a day (BID) | ORAL | 1 refills | Status: DC
  Filled 2021-11-13 – 2022-05-06 (×2): qty 60, 30d supply, fill #0
  Filled 2022-07-14: qty 60, 30d supply, fill #1

## 2021-11-13 MED ORDER — TRAZODONE HCL 150 MG PO TABS
150.0000 mg | ORAL_TABLET | Freq: Every day | ORAL | 1 refills | Status: DC
  Filled 2021-11-13 – 2021-12-12 (×2): qty 30, 30d supply, fill #0
  Filled 2022-01-12: qty 30, 30d supply, fill #1

## 2021-11-13 MED ORDER — FAMOTIDINE 20 MG PO TABS
20.0000 mg | ORAL_TABLET | Freq: Two times a day (BID) | ORAL | 1 refills | Status: DC
  Filled 2021-11-13: qty 60, 30d supply, fill #0
  Filled 2022-04-04: qty 60, 30d supply, fill #1

## 2021-11-13 MED ORDER — ELIQUIS 5 MG PO TABS
5.0000 mg | ORAL_TABLET | Freq: Every day | ORAL | 3 refills | Status: DC
  Filled 2021-11-13: qty 30, 30d supply, fill #0

## 2021-11-13 MED ORDER — RIZATRIPTAN BENZOATE 10 MG PO TABS
ORAL_TABLET | ORAL | 1 refills | Status: DC
  Filled 2021-11-13: qty 18, 30d supply, fill #0
  Filled 2022-05-03: qty 18, 30d supply, fill #1
  Filled 2022-08-02: qty 4, 7d supply, fill #2

## 2021-11-13 MED FILL — Duloxetine HCl Enteric Coated Pellets Cap 60 MG (Base Eq): ORAL | 90 days supply | Qty: 180 | Fill #1 | Status: AC

## 2021-11-14 ENCOUNTER — Other Ambulatory Visit (HOSPITAL_COMMUNITY): Payer: Self-pay

## 2021-11-14 LAB — HEPATIC FUNCTION PANEL
ALT: 15 U/L (ref 7–35)
AST: 17 (ref 13–35)
Alkaline Phosphatase: 112 (ref 25–125)
Bilirubin, Total: 0.4

## 2021-11-14 LAB — BASIC METABOLIC PANEL
BUN: 12 (ref 4–21)
CO2: 28 — AB (ref 13–22)
Chloride: 102 (ref 99–108)
Creatinine: 0.9 (ref 0.5–1.1)
Glucose: 86
Potassium: 4.1 mEq/L (ref 3.5–5.1)
Sodium: 142 (ref 137–147)

## 2021-11-14 LAB — CBC AND DIFFERENTIAL
HCT: 40 (ref 36–46)
Hemoglobin: 12.8 (ref 12.0–16.0)
Platelets: 274 10*3/uL (ref 150–400)
WBC: 5

## 2021-11-14 LAB — CBC: RBC: 4.51 (ref 3.87–5.11)

## 2021-11-14 LAB — IRON,TIBC AND FERRITIN PANEL
Ferritin: 105.6
Iron: 91
TIBC: 396
UIBC: 305

## 2021-11-14 LAB — VITAMIN B12: Vitamin B-12: 523

## 2021-11-14 LAB — LIPID PANEL
Cholesterol: 191 (ref 0–200)
HDL: 75 — AB (ref 35–70)
LDL Cholesterol: 97
Triglycerides: 96 (ref 40–160)

## 2021-11-14 LAB — TSH: TSH: 1.41 (ref 0.41–5.90)

## 2021-11-14 LAB — COMPREHENSIVE METABOLIC PANEL
Albumin: 4.4 (ref 3.5–5.0)
Calcium: 9.8 (ref 8.7–10.7)

## 2021-11-14 LAB — HEMOGLOBIN A1C: Hemoglobin A1C: 5.6

## 2021-11-14 LAB — VITAMIN D 25 HYDROXY (VIT D DEFICIENCY, FRACTURES): Vit D, 25-Hydroxy: 29.67

## 2021-11-14 LAB — HM HEPATITIS C SCREENING LAB: HM Hepatitis Screen: NEGATIVE

## 2021-11-14 MED ORDER — ELIQUIS 5 MG PO TABS
5.0000 mg | ORAL_TABLET | Freq: Two times a day (BID) | ORAL | 3 refills | Status: DC
  Filled 2021-11-14: qty 60, 30d supply, fill #0

## 2021-11-27 ENCOUNTER — Other Ambulatory Visit (HOSPITAL_COMMUNITY): Payer: Self-pay

## 2021-12-04 ENCOUNTER — Other Ambulatory Visit (HOSPITAL_COMMUNITY): Payer: Self-pay

## 2021-12-04 MED ORDER — ERGOCALCIFEROL 1.25 MG (50000 UT) PO CAPS
50000.0000 [IU] | ORAL_CAPSULE | ORAL | 3 refills | Status: DC
  Filled 2021-12-04: qty 4, 28d supply, fill #0
  Filled 2022-01-02: qty 4, 28d supply, fill #1
  Filled 2022-02-06: qty 4, 28d supply, fill #2

## 2021-12-12 MED FILL — Montelukast Sodium Tab 10 MG (Base Equiv): ORAL | 90 days supply | Qty: 90 | Fill #2 | Status: AC

## 2021-12-13 ENCOUNTER — Other Ambulatory Visit (HOSPITAL_COMMUNITY): Payer: Self-pay

## 2021-12-19 ENCOUNTER — Telehealth: Payer: Self-pay | Admitting: *Deleted

## 2021-12-19 NOTE — Telephone Encounter (Signed)
Faxed FMLA to [employer] at [phone]  

## 2021-12-23 ENCOUNTER — Other Ambulatory Visit (HOSPITAL_COMMUNITY): Payer: Self-pay

## 2022-01-02 ENCOUNTER — Other Ambulatory Visit (HOSPITAL_COMMUNITY): Payer: Self-pay

## 2022-01-02 MED FILL — Famotidine Tab 20 MG: ORAL | 90 days supply | Qty: 180 | Fill #2 | Status: AC

## 2022-01-13 ENCOUNTER — Other Ambulatory Visit (HOSPITAL_COMMUNITY): Payer: Self-pay

## 2022-01-20 ENCOUNTER — Other Ambulatory Visit (HOSPITAL_COMMUNITY): Payer: Self-pay

## 2022-01-20 ENCOUNTER — Other Ambulatory Visit: Payer: Self-pay

## 2022-01-20 ENCOUNTER — Ambulatory Visit: Payer: 59 | Admitting: Podiatry

## 2022-01-20 ENCOUNTER — Ambulatory Visit (INDEPENDENT_AMBULATORY_CARE_PROVIDER_SITE_OTHER): Payer: 59

## 2022-01-20 DIAGNOSIS — M722 Plantar fascial fibromatosis: Secondary | ICD-10-CM

## 2022-01-20 MED ORDER — BETAMETHASONE SOD PHOS & ACET 6 (3-3) MG/ML IJ SUSP: 3.0000 mg | Freq: Once | INTRAMUSCULAR | Status: DC

## 2022-01-20 MED ORDER — METHYLPREDNISOLONE 4 MG PO TBPK
ORAL_TABLET | ORAL | 0 refills | Status: DC
  Filled 2022-01-20: qty 21, 6d supply, fill #0

## 2022-01-20 NOTE — Progress Notes (Signed)
° °  Subjective: 46 y.o. female presenting today as a new patient for evaluation of pain and tenderness associated to the lateral aspect of the right heel has been going on for few months now.  Idiopathic onset.  She denies a history of injury.  She did purchase a new pair of tennis shoes with OTC insoles to help alleviate the pain which did not help.   Past Medical History:  Diagnosis Date   Allergy    Asthma    Chicken pox    Depression    Migraine    Migraines    Pulmonary embolism (HCC)    Pulmonary embolism (HCC) 01/2020   No past surgical history on file. Allergies  Allergen Reactions   Codeine Nausea And Vomiting   Penicillins Hives    Did it involve swelling of the face/tongue/throat, SOB, or low BP?No Did it involve sudden or severe rash/hives, skin peeling, or any reaction on the inside of your mouth or nose? Yes Did you need to seek medical attention at a hospital or doctor's office?No When did it last happen? Teenager    If all above answers are NO, may proceed with cephalosporin use.   Latex Rash     Objective: Physical Exam General: The patient is alert and oriented x3 in no acute distress.  Dermatology: Skin is warm, dry and supple bilateral lower extremities. Negative for open lesions or macerations bilateral.   Vascular: Dorsalis Pedis and Posterior Tibial pulses palpable bilateral.  Capillary fill time is immediate to all digits.  Neurological: Epicritic and protective threshold intact bilateral.   Musculoskeletal: Tenderness to palpation to the plantar aspect of the right heel along the plantar fascia. All other joints range of motion within normal limits bilateral. Strength 5/5 in all groups bilateral.   Radiographic exam: Normal osseous mineralization. Joint spaces preserved. No fracture/dislocation/boney destruction. No other soft tissue abnormalities or radiopaque foreign bodies.  Small plantar heel spur noted on lateral view  Assessment: 1.  Plantar fasciitis right  Plan of Care:  1. Patient evaluated. Xrays reviewed.   2. Injection of 0.5cc Celestone soluspan injected into the right plantar fascia  3. Rx for Medrol Dose Pack placed 4.  No NSAIDs prescribed.  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 Plantar fascial band(s) dispensed 6. Instructed patient regarding therapies and modalities at home to alleviate symptoms.  7. Return to clinic in 4 weeks.    *CV ICU nurse   Felecia Shelling, DPM [facility]  Dr. Felecia Shelling, DPM    [address] [place], [place] [address]                Office [phone]  Fax [phone]

## 2022-01-28 ENCOUNTER — Other Ambulatory Visit (HOSPITAL_COMMUNITY): Payer: Self-pay

## 2022-01-29 ENCOUNTER — Other Ambulatory Visit (HOSPITAL_COMMUNITY): Payer: Self-pay

## 2022-01-29 MED ORDER — TRAZODONE HCL 150 MG PO TABS
ORAL_TABLET | ORAL | 0 refills | Status: DC
  Filled 2022-01-29: qty 30, 30d supply, fill #0

## 2022-02-06 ENCOUNTER — Other Ambulatory Visit: Payer: Self-pay

## 2022-02-06 ENCOUNTER — Other Ambulatory Visit (HOSPITAL_COMMUNITY): Payer: Self-pay

## 2022-02-06 ENCOUNTER — Telehealth: Payer: Self-pay

## 2022-02-06 NOTE — Telephone Encounter (Signed)
Please clarify which medications she is interested in.  ? ?She has prior prescriptions for clonazepam, oxycodone, and Vyvanse. ? ?This is a combination of medication that I would not continue and any chronic use. ? ?These are 3 controlled substances and none would be able to be refilled at a new patient visit.  Per our clinic policy a UDS would need to be done if any of these medications were to be continued. ? ? ?

## 2022-02-06 NOTE — Telephone Encounter (Signed)
Patient called wanted to transfer care to our office. She was seen at [facility] but moved so that she changed to [facility] but they are not refusing to fill medications. She was seen by behavior health but her provider retired. Wanted to verify that she could get medication refills from our office and if she can set up New patient appointment.  ?

## 2022-02-06 NOTE — Telephone Encounter (Signed)
She is no longer on oxycodone that was only after procedure. She will need refill on Vyvanse and Clonazepam that is taken as needed. If patient is ok with policy is it ok to make appointment?  ? ? ?

## 2022-02-07 NOTE — Telephone Encounter (Signed)
That would be fine.  ? ?Reviewed behavioral health note and would be able to fill vyvanse. Would likely be OK for a rare/limited use of clonazepam ?

## 2022-02-13 DIAGNOSIS — E785 Hyperlipidemia, unspecified: Secondary | ICD-10-CM | POA: Diagnosis not present

## 2022-02-13 DIAGNOSIS — N951 Menopausal and female climacteric states: Secondary | ICD-10-CM | POA: Diagnosis not present

## 2022-02-13 DIAGNOSIS — E559 Vitamin D deficiency, unspecified: Secondary | ICD-10-CM | POA: Diagnosis not present

## 2022-02-13 DIAGNOSIS — E038 Other specified hypothyroidism: Secondary | ICD-10-CM | POA: Diagnosis not present

## 2022-02-13 DIAGNOSIS — Z6836 Body mass index (BMI) 36.0-36.9, adult: Secondary | ICD-10-CM | POA: Diagnosis not present

## 2022-02-13 DIAGNOSIS — R635 Abnormal weight gain: Secondary | ICD-10-CM | POA: Diagnosis not present

## 2022-02-13 LAB — T3, FREE: T3, Free: 2.6

## 2022-02-13 LAB — BASIC METABOLIC PANEL: Glucose: 99

## 2022-02-13 LAB — TESTOSTERONE, TOTAL, LC/MS
Free Testosterone(Direct): 1.3
Testosterone: 17

## 2022-02-13 LAB — PROGESTERONE: Progesterone: 0.2

## 2022-02-13 LAB — FOLLICLE STIMULATING HORMONE: FSH: 44.8

## 2022-02-13 LAB — LEPTIN: Leptin, Serum: 110.6

## 2022-02-13 LAB — DHEA-SULFATE, SERUM: DHEA-Sulfate, Serum: 30.7

## 2022-02-13 LAB — THYROID PEROXIDASE ANTIBODIES (TPO) (REFL): Anti-TPO Ab (RDL): 1

## 2022-02-13 LAB — ESTRADIOL: Estradiol: 17.5

## 2022-02-13 LAB — CHG ASSAY OF SEX HORMONE BINDING GLOBULIN: Sex Horm Binding Glob, Serum: 31.8

## 2022-02-13 LAB — HEMOGLOBIN A1C: Hemoglobin A1C: 5.3

## 2022-02-13 LAB — T4, FREE: Free T4: 0.76

## 2022-02-18 DIAGNOSIS — F329 Major depressive disorder, single episode, unspecified: Secondary | ICD-10-CM | POA: Diagnosis not present

## 2022-02-18 DIAGNOSIS — R635 Abnormal weight gain: Secondary | ICD-10-CM | POA: Diagnosis not present

## 2022-02-18 DIAGNOSIS — Z1339 Encounter for screening examination for other mental health and behavioral disorders: Secondary | ICD-10-CM | POA: Diagnosis not present

## 2022-02-18 DIAGNOSIS — Z1331 Encounter for screening for depression: Secondary | ICD-10-CM | POA: Diagnosis not present

## 2022-02-18 DIAGNOSIS — Z6837 Body mass index (BMI) 37.0-37.9, adult: Secondary | ICD-10-CM | POA: Diagnosis not present

## 2022-02-18 DIAGNOSIS — E559 Vitamin D deficiency, unspecified: Secondary | ICD-10-CM | POA: Diagnosis not present

## 2022-02-18 DIAGNOSIS — N951 Menopausal and female climacteric states: Secondary | ICD-10-CM | POA: Diagnosis not present

## 2022-02-18 DIAGNOSIS — E038 Other specified hypothyroidism: Secondary | ICD-10-CM | POA: Diagnosis not present

## 2022-02-20 ENCOUNTER — Other Ambulatory Visit (HOSPITAL_COMMUNITY): Payer: Self-pay

## 2022-02-21 ENCOUNTER — Other Ambulatory Visit (HOSPITAL_COMMUNITY): Payer: Self-pay

## 2022-02-21 MED ORDER — LIOTHYRONINE SODIUM 5 MCG PO TABS
ORAL_TABLET | ORAL | 2 refills | Status: DC
  Filled 2022-02-21 (×3): qty 60, 30d supply, fill #0
  Filled 2022-03-28 – 2022-03-31 (×2): qty 60, 30d supply, fill #1

## 2022-03-03 ENCOUNTER — Other Ambulatory Visit (HOSPITAL_COMMUNITY): Payer: Self-pay

## 2022-03-03 DIAGNOSIS — E038 Other specified hypothyroidism: Secondary | ICD-10-CM | POA: Diagnosis not present

## 2022-03-03 DIAGNOSIS — Z6837 Body mass index (BMI) 37.0-37.9, adult: Secondary | ICD-10-CM | POA: Diagnosis not present

## 2022-03-03 LAB — BASIC METABOLIC PANEL
BUN: 17 (ref 4–21)
CO2: 25 — AB (ref 13–22)
Chloride: 108 (ref 99–108)
Creatinine: 1 (ref 0.5–1.1)

## 2022-03-03 LAB — LIPID PANEL
Cholesterol: 202 — AB (ref 0–200)
HDL: 64 (ref 35–70)
LDL Cholesterol: 124
Triglycerides: 71 (ref 40–160)

## 2022-03-03 LAB — CBC AND DIFFERENTIAL
HCT: 39 (ref 36–46)
Hemoglobin: 12.7 (ref 12.0–16.0)
Platelets: 238 10*3/uL (ref 150–400)
WBC: 4.3

## 2022-03-03 LAB — IRON,TIBC AND FERRITIN PANEL
Ferritin: 95
Iron: 50

## 2022-03-03 LAB — VITAMIN D 25 HYDROXY (VIT D DEFICIENCY, FRACTURES): Vit D, 25-Hydroxy: 28.8

## 2022-03-03 LAB — COMPREHENSIVE METABOLIC PANEL
Albumin: 4.2 (ref 3.5–5.0)
Calcium: 9.4 (ref 8.7–10.7)

## 2022-03-03 LAB — HEPATIC FUNCTION PANEL
ALT: 15 U/L (ref 7–35)
AST: 15 (ref 13–35)
Alkaline Phosphatase: 116 (ref 25–125)
Bilirubin, Total: 0.3

## 2022-03-03 LAB — CBC: RBC: 4.42 (ref 3.87–5.11)

## 2022-03-03 LAB — TSH: TSH: 1.78 (ref 0.41–5.90)

## 2022-03-06 NOTE — Telephone Encounter (Signed)
Patient has NP appt with Audria Nine; NP on 03/28/22 ?

## 2022-03-12 DIAGNOSIS — K219 Gastro-esophageal reflux disease without esophagitis: Secondary | ICD-10-CM | POA: Diagnosis not present

## 2022-03-12 DIAGNOSIS — Z6836 Body mass index (BMI) 36.0-36.9, adult: Secondary | ICD-10-CM | POA: Diagnosis not present

## 2022-03-12 DIAGNOSIS — R140 Abdominal distension (gaseous): Secondary | ICD-10-CM | POA: Diagnosis not present

## 2022-03-18 DIAGNOSIS — E038 Other specified hypothyroidism: Secondary | ICD-10-CM | POA: Diagnosis not present

## 2022-03-18 DIAGNOSIS — Z6836 Body mass index (BMI) 36.0-36.9, adult: Secondary | ICD-10-CM | POA: Diagnosis not present

## 2022-03-25 DIAGNOSIS — Z6836 Body mass index (BMI) 36.0-36.9, adult: Secondary | ICD-10-CM | POA: Diagnosis not present

## 2022-03-25 DIAGNOSIS — K219 Gastro-esophageal reflux disease without esophagitis: Secondary | ICD-10-CM | POA: Diagnosis not present

## 2022-03-28 ENCOUNTER — Other Ambulatory Visit (HOSPITAL_COMMUNITY): Payer: Self-pay

## 2022-03-28 ENCOUNTER — Encounter: Payer: Self-pay | Admitting: Nurse Practitioner

## 2022-03-28 ENCOUNTER — Ambulatory Visit: Payer: 59 | Admitting: Nurse Practitioner

## 2022-03-28 VITALS — BP 124/78 | HR 77 | Temp 97.5°F | Resp 14 | Ht 68.0 in | Wt 239.6 lb

## 2022-03-28 DIAGNOSIS — I2699 Other pulmonary embolism without acute cor pulmonale: Secondary | ICD-10-CM | POA: Diagnosis not present

## 2022-03-28 DIAGNOSIS — G4700 Insomnia, unspecified: Secondary | ICD-10-CM

## 2022-03-28 DIAGNOSIS — F900 Attention-deficit hyperactivity disorder, predominantly inattentive type: Secondary | ICD-10-CM | POA: Diagnosis not present

## 2022-03-28 DIAGNOSIS — R21 Rash and other nonspecific skin eruption: Secondary | ICD-10-CM

## 2022-03-28 DIAGNOSIS — F3341 Major depressive disorder, recurrent, in partial remission: Secondary | ICD-10-CM

## 2022-03-28 MED ORDER — METHYLPREDNISOLONE ACETATE 40 MG/ML IJ SUSP
40.0000 mg | Freq: Once | INTRAMUSCULAR | Status: AC
  Administered 2022-03-28: 40 mg via INTRAMUSCULAR

## 2022-03-28 MED ORDER — TRAZODONE HCL 150 MG PO TABS
150.0000 mg | ORAL_TABLET | Freq: Every day | ORAL | 1 refills | Status: DC
  Filled 2022-03-28: qty 90, 90d supply, fill #0
  Filled 2022-06-24: qty 90, 90d supply, fill #1

## 2022-03-28 NOTE — Assessment & Plan Note (Signed)
Currently on Eliquis.  Patient has not seen hematology.  Will refer to hematology for further evaluation.  Patient does not want to come off of blood thinning medication currently.  Continue Eliquis ?

## 2022-03-28 NOTE — Assessment & Plan Note (Signed)
Patient has developed insomnia in the past.  Patient was placed on trazodone 100 mg nightly.  Patient has been on medication for approximate 3 days with the medication per patient request ?

## 2022-03-28 NOTE — Assessment & Plan Note (Signed)
Patient has been on Vyvanse in the past.  States she recently stopped the Vyvanse due to seeing [facility] MD and starting on the weight management program.  Defer refilling Vyvanse for now ?

## 2022-03-28 NOTE — Progress Notes (Signed)
? ?New Patient Office Visit ? ?Subjective   ? ?Patient ID: Sherri Miller, female    DOB: 01-06-1976  Age: 46 y.o. MRN: [id number] ? ?CC:  ?Chief Complaint  ?Patient presents with  ? Establish Care  ?  Previous [facility], also seen NP with Delrae Rend MD in [place]   ? Medication Refill  ? ? ?HPI ?Sherri Miller presents to establish care ? ?PE: history of bilateral PE. Currently on eliquis for anticoagulation.  Patient states she does not want to come off of medication.  States she has not seen a hematologist.  But is open to the idea ? ?Asthma: Advair and albuterol. Cannot remember when she used albuterol last.  Also on Singulair. ? ? ?MDD: currently on cymbalta and trazadone.  States that she has been out of trazodone for approximate last 3 days.  Does well on duloxetine currently.  Denies HI/SI/AVH ? ?ADHD: was on vyvanse but dc due to weight loss management. ? ?Insomnia: Has been managed on trazodone in the past.  Did speak with patient we will start patient with 150 mg trazodone can increase if not beneficial. ? ?Migrianes:once weekly with maxalt it will abort. That she is back on some hormones she has noticed an increase in her headaches. ?Has aura with nausea. Can be one unilarteral or bilateral and blindness ? ? ?Outpatient Encounter Medications as of 03/28/2022  ?Medication Sig  ? albuterol (VENTOLIN HFA) 108 (90 Base) MCG/ACT inhaler INHALE 2 PUFFS INTO THE LUNGS EVERY 6 HOURS AS NEEDED FOR WHEEZING OR SHORTNESS OF BREATH.  ? apixaban (ELIQUIS) 5 MG TABS tablet TAKE 1 TABLET BY MOUTH TWICE DAILY  ? Calcium Carbonate-Vit D-Min (CALCIUM 1200 PO) Take by mouth. Not sure of the dose  ? DULoxetine (CYMBALTA) 60 MG capsule Take 1 capsule by mouth 2 times daily.  ? famotidine (PEPCID) 20 MG tablet Take 1 tablet by mouth 2 times daily.  ? Fluticasone-Salmeterol (ADVAIR) 250-50 MCG/DOSE AEPB INHALE 1 PUFF INTO THE LUNGS IN THE MORNING AND AT BEDTIME.  ? liothyronine (CYTOMEL) 5 MCG tablet Take 1 tablet  daily on an empty stomach for 1 week then increase to 2 tablets daily  ? Naltrexone HCl, Pain, 4.5 MG CAPS To take 2 tablets daily  ? Prasterone, DHEA, (DHEA 50 PO) Take by mouth.  ? rizatriptan (MAXALT) 10 MG tablet Take 1 tablet by mouth as needed  ? Testosterone 10 MG/ACT (2%) GEL Place onto the skin. Apply 1 click from the bottle daily-compounded medication.  ? traZODone (DESYREL) 150 MG tablet Take 1 tablet (150 mg total) by mouth at bedtime.  ? VITAMIN D PO Take by mouth. With K-5000 u ?  ? [DISCONTINUED] traZODone (DESYREL) 150 MG tablet Take 1 tablet by mouth at bedtime (Patient taking differently: 2 at bedtime)  ? montelukast (SINGULAIR) 10 MG tablet TAKE 1 TABLET BY MOUTH AT BEDTIME  ? [DISCONTINUED] apixaban (ELIQUIS) 5 MG TABS tablet Take 1 tablet by mouth daily  ? [DISCONTINUED] apixaban (ELIQUIS) 5 MG TABS tablet Take 1 (one) Tablet by mouth two times daily  ? [DISCONTINUED] clonazePAM (KLONOPIN) 0.5 MG tablet TAKE 1 TABLET BY MOUTH 2 TIMES DAILY FOR ANXIETY IF NEEDED  ? [DISCONTINUED] drospirenone-ethinyl estradiol (YAZ) 3-0.02 MG tablet TAKE 1 TABLET BY MOUTH DAILY. SKIP PLACEBOS AND TAKE ACTIVE PILLS CONTINUOUSLY.  ? [DISCONTINUED] DULoxetine (CYMBALTA) 60 MG capsule TAKE 2 CAPSULES BY MOUTH DAILY  ? [DISCONTINUED] ergocalciferol (VITAMIN D2) 1.25 MG (50000 UT) capsule Take 1 capsule (50,000 Units  total) by mouth once a week as directed  ? [DISCONTINUED] famotidine (PEPCID) 20 MG tablet TAKE 1 TABLET BY MOUTH TWICE DAILY  ? [DISCONTINUED] furosemide (LASIX) 20 MG tablet Take 1 tablet (20 mg total) by mouth daily as needed for edema.  ? [DISCONTINUED] levocetirizine (XYZAL) 5 MG tablet TAKE 1 TABLET BY MOUTH EVERY MORNING  ? [DISCONTINUED] lisdexamfetamine (VYVANSE) 40 MG capsule Take 1 capsule (40 mg total) by mouth every morning.  ? [DISCONTINUED] methylPREDNISolone (MEDROL DOSEPAK) 4 MG TBPK tablet Take as directed per package instructions (6,5,4,3,2,1)  ? [DISCONTINUED] norethindrone (MICRONOR)  0.35 MG tablet TAKE 1 TABLET BY MOUTH ONCE A DAY (Patient not taking: No sig reported)  ? [DISCONTINUED] rizatriptan (MAXALT) 10 MG tablet TAKE ONE TABLET BY MOUTH AS NEEDED FOR MIGRAINE. MAY REPEAT IN 2 HOURS IF NEEDED  ? [DISCONTINUED] rizatriptan (MAXALT-MLT) 10 MG disintegrating tablet DISSOLVE 1 TABLET BY MOUTH AS NEEDED FOR MIGRAINE, MAY REPEAT IN 2 HOURS IF NEEDED. NO MORE THAN 30MG  IN 24 HOURS (Patient taking differently: Take 10 mg by mouth daily as needed for migraine.)  ? ?Facility-Administered Encounter Medications as of 03/28/2022  ?Medication  ? betamethasone acetate-betamethasone sodium phosphate (CELESTONE) injection 3 mg  ? [COMPLETED] methylPREDNISolone acetate (DEPO-MEDROL) injection 40 mg  ? ? ?Past Medical History:  ?Diagnosis Date  ? Allergy   ? Asthma   ? Chicken pox   ? Depression   ? Migraine   ? Migraines   ? Pulmonary embolism (HCC)   ? Pulmonary embolism (HCC) 01/2020  ? ? ?Past Surgical History:  ?Procedure Laterality Date  ? CHOLECYSTECTOMY    ? ? ?Family History  ?Adopted: Yes  ?Family history unknown: Yes  ? ? ?Social History  ? ?Socioeconomic History  ? Marital status: Single  ?  Spouse name: Not on file  ? Number of children: Not on file  ? Years of education: Not on file  ? Highest education level: Not on file  ?Occupational History  ? Occupation: Nurse  ?Tobacco Use  ? Smoking status: Never  ? Smokeless tobacco: Never  ?Vaping Use  ? Vaping Use: Never used  ?Substance and Sexual Activity  ? Alcohol use: No  ?  Alcohol/week: 0.0 standard drinks  ? Drug use: No  ? Sexual activity: Yes  ?Other Topics Concern  ? Not on file  ?Social History Narrative  ? Works as a Copycritical care nurse.  ? ?Social Determinants of Health  ? ?Financial Resource Strain: Not on file  ?Food Insecurity: Not on file  ?Transportation Needs: Not on file  ?Physical Activity: Not on file  ?Stress: Not on file  ?Social Connections: Not on file  ?Intimate Partner Violence: Not on file  ? ? ?Review of Systems   ?Constitutional:  Negative for chills and fever.  ?Respiratory:  Positive for cough. Negative for shortness of breath.   ?Cardiovascular:  Negative for chest pain, palpitations and leg swelling.  ?Gastrointestinal:  Negative for diarrhea, nausea and vomiting.  ?     BM daily  ?Genitourinary:  Negative for dysuria and hematuria.  ?     Nocturia x1  ?Neurological:  Positive for headaches.  ?Psychiatric/Behavioral:  Negative for hallucinations and suicidal ideas.   ? ?  ? ? ?Objective   ? ?BP 124/78   Pulse 77   Temp (!) 97.5 ?F (36.4 ?C)   Resp 14   Ht 5\' 8"  (1.727 m)   Wt 239 lb 9 oz (108.7 kg)   SpO2 97%   BMI  36.43 kg/m?  ? ?Physical Exam ?Vitals and nursing note reviewed.  ?Constitutional:   ?   Appearance: Normal appearance. She is obese.  ?HENT:  ?   Right Ear: Tympanic membrane, ear canal and external ear normal.  ?   Left Ear: Tympanic membrane, ear canal and external ear normal.  ?Eyes:  ?   Extraocular Movements: Extraocular movements intact.  ?   Pupils: Pupils are equal, round, and reactive to light.  ?Cardiovascular:  ?   Rate and Rhythm: Normal rate and regular rhythm.  ?   Pulses: Normal pulses.  ?   Heart sounds: Normal heart sounds.  ?Pulmonary:  ?   Effort: Pulmonary effort is normal.  ?   Breath sounds: Normal breath sounds.  ?Abdominal:  ?   General: Bowel sounds are normal.  ?Lymphadenopathy:  ?   Cervical: No cervical adenopathy.  ?Skin: ?   Findings: Rash present.  ? ?    ?   Comments: Diffuse macular rash to right upper quadrant of abdomen.  States has been on and off ever since she had a cholecystectomy.  ?Neurological:  ?   General: No focal deficit present.  ?   Mental Status: She is alert.  ?   Deep Tendon Reflexes:  ?   Reflex Scores: ?     Bicep reflexes are 2+ on the right side and 2+ on the left side. ?     Patellar reflexes are 2+ on the right side and 2+ on the left side. ?   Comments: Bilateral upper and lower extremity strength 5/5  ?Psychiatric:     ?   Mood and Affect:  Mood normal.     ?   Behavior: Behavior normal.     ?   Thought Content: Thought content normal.  ? ? ? ?  ? ?Assessment & Plan:  ? ?Problem List Items Addressed This Visit   ? ?  ? Cardiovascular and Mediastinum  ? Pul

## 2022-03-28 NOTE — Assessment & Plan Note (Signed)
Nonspecific rash rash to right upper quadrant.  We will administer Depo-Medrol 40 mg IM x1 in office today.  Follow-up if no improvement ?

## 2022-03-28 NOTE — Assessment & Plan Note (Signed)
Currently maintained on duloxetine 60 mg daily.  Patient seems to be doing well on medication, denies HI/SI/AVH.  To take medication as prescribed ?

## 2022-03-28 NOTE — Patient Instructions (Signed)
Nice to see you today ?I sent in the medication in a 90 day supply ?Follow up with me in 6 months. Please get me the labs from [facility] ?Follow up with me sooner if needed ?

## 2022-03-31 ENCOUNTER — Other Ambulatory Visit (HOSPITAL_COMMUNITY): Payer: Self-pay

## 2022-04-02 ENCOUNTER — Other Ambulatory Visit (HOSPITAL_COMMUNITY): Payer: Self-pay

## 2022-04-02 ENCOUNTER — Encounter: Payer: Self-pay | Admitting: Nurse Practitioner

## 2022-04-02 DIAGNOSIS — E785 Hyperlipidemia, unspecified: Secondary | ICD-10-CM | POA: Diagnosis not present

## 2022-04-02 DIAGNOSIS — Z6836 Body mass index (BMI) 36.0-36.9, adult: Secondary | ICD-10-CM | POA: Diagnosis not present

## 2022-04-02 MED ORDER — WEGOVY 0.5 MG/0.5ML ~~LOC~~ SOAJ
SUBCUTANEOUS | 0 refills | Status: DC
Start: 2022-04-02 — End: 2022-04-17
  Filled 2022-04-02 – 2022-04-05 (×2): qty 2, 28d supply, fill #0

## 2022-04-02 NOTE — Telephone Encounter (Signed)
See mychart.  

## 2022-04-03 ENCOUNTER — Other Ambulatory Visit (HOSPITAL_COMMUNITY): Payer: Self-pay

## 2022-04-04 ENCOUNTER — Inpatient Hospital Stay: Payer: 59

## 2022-04-04 ENCOUNTER — Other Ambulatory Visit (HOSPITAL_COMMUNITY): Payer: Self-pay

## 2022-04-04 ENCOUNTER — Inpatient Hospital Stay: Payer: 59 | Admitting: Oncology

## 2022-04-05 ENCOUNTER — Other Ambulatory Visit (HOSPITAL_COMMUNITY): Payer: Self-pay

## 2022-04-07 ENCOUNTER — Other Ambulatory Visit (HOSPITAL_COMMUNITY): Payer: Self-pay

## 2022-04-08 ENCOUNTER — Other Ambulatory Visit (HOSPITAL_COMMUNITY): Payer: Self-pay

## 2022-04-08 DIAGNOSIS — E038 Other specified hypothyroidism: Secondary | ICD-10-CM | POA: Diagnosis not present

## 2022-04-08 DIAGNOSIS — Z6836 Body mass index (BMI) 36.0-36.9, adult: Secondary | ICD-10-CM | POA: Diagnosis not present

## 2022-04-09 ENCOUNTER — Other Ambulatory Visit (HOSPITAL_COMMUNITY): Payer: Self-pay

## 2022-04-09 MED ORDER — LIOTHYRONINE SODIUM 5 MCG PO TABS
ORAL_TABLET | ORAL | 2 refills | Status: DC
Start: 2022-04-08 — End: 2022-10-23
  Filled 2022-05-03: qty 60, 30d supply, fill #0
  Filled 2022-06-04: qty 60, 30d supply, fill #1
  Filled 2022-07-09: qty 60, 30d supply, fill #2

## 2022-04-17 ENCOUNTER — Other Ambulatory Visit (HOSPITAL_COMMUNITY): Payer: Self-pay

## 2022-04-17 DIAGNOSIS — E038 Other specified hypothyroidism: Secondary | ICD-10-CM | POA: Diagnosis not present

## 2022-04-17 DIAGNOSIS — N951 Menopausal and female climacteric states: Secondary | ICD-10-CM | POA: Diagnosis not present

## 2022-04-17 DIAGNOSIS — K219 Gastro-esophageal reflux disease without esophagitis: Secondary | ICD-10-CM | POA: Diagnosis not present

## 2022-04-17 DIAGNOSIS — Z6835 Body mass index (BMI) 35.0-35.9, adult: Secondary | ICD-10-CM | POA: Diagnosis not present

## 2022-04-17 MED ORDER — WEGOVY 0.5 MG/0.5ML ~~LOC~~ SOAJ
SUBCUTANEOUS | 2 refills | Status: DC
  Filled 2022-04-17: qty 2, 30d supply, fill #0

## 2022-04-24 ENCOUNTER — Other Ambulatory Visit (HOSPITAL_COMMUNITY): Payer: Self-pay

## 2022-04-24 DIAGNOSIS — Z6835 Body mass index (BMI) 35.0-35.9, adult: Secondary | ICD-10-CM | POA: Diagnosis not present

## 2022-04-24 DIAGNOSIS — G4700 Insomnia, unspecified: Secondary | ICD-10-CM | POA: Diagnosis not present

## 2022-04-24 DIAGNOSIS — E785 Hyperlipidemia, unspecified: Secondary | ICD-10-CM | POA: Diagnosis not present

## 2022-04-24 MED ORDER — WEGOVY 1 MG/0.5ML ~~LOC~~ SOAJ
1.0000 mg | SUBCUTANEOUS | 0 refills | Status: DC
Start: 2022-04-24 — End: 2022-06-04
  Filled 2022-04-24 (×3): qty 2, 28d supply, fill #0

## 2022-05-06 ENCOUNTER — Other Ambulatory Visit (HOSPITAL_COMMUNITY): Payer: Self-pay

## 2022-05-07 ENCOUNTER — Other Ambulatory Visit (HOSPITAL_COMMUNITY): Payer: Self-pay

## 2022-05-07 ENCOUNTER — Other Ambulatory Visit: Payer: Self-pay | Admitting: Nurse Practitioner

## 2022-05-07 MED ORDER — MONTELUKAST SODIUM 10 MG PO TABS
ORAL_TABLET | Freq: Every day | ORAL | 3 refills | Status: DC
  Filled 2022-05-07: qty 90, 90d supply, fill #0
  Filled 2022-07-14: qty 90, 90d supply, fill #1
  Filled 2023-03-23: qty 90, 90d supply, fill #2

## 2022-05-07 MED ORDER — FAMOTIDINE 20 MG PO TABS
20.0000 mg | ORAL_TABLET | Freq: Every day | ORAL | 1 refills | Status: DC
  Filled 2022-05-07: qty 30, 30d supply, fill #0
  Filled 2022-05-26: qty 30, 30d supply, fill #1

## 2022-05-08 ENCOUNTER — Other Ambulatory Visit (HOSPITAL_COMMUNITY): Payer: Self-pay

## 2022-05-08 DIAGNOSIS — R110 Nausea: Secondary | ICD-10-CM | POA: Diagnosis not present

## 2022-05-08 DIAGNOSIS — E038 Other specified hypothyroidism: Secondary | ICD-10-CM | POA: Diagnosis not present

## 2022-05-08 DIAGNOSIS — Z6835 Body mass index (BMI) 35.0-35.9, adult: Secondary | ICD-10-CM | POA: Diagnosis not present

## 2022-05-08 IMAGING — CR DG CHEST 2V
2 series · 2 of 2 positions shown · non-contrast
Comparison: Chest x-ray 01/13/2020.

CLINICAL DATA: 45-year-old female with history of chest pain.

EXAM:
CHEST - 2 VIEW

[chest pa]
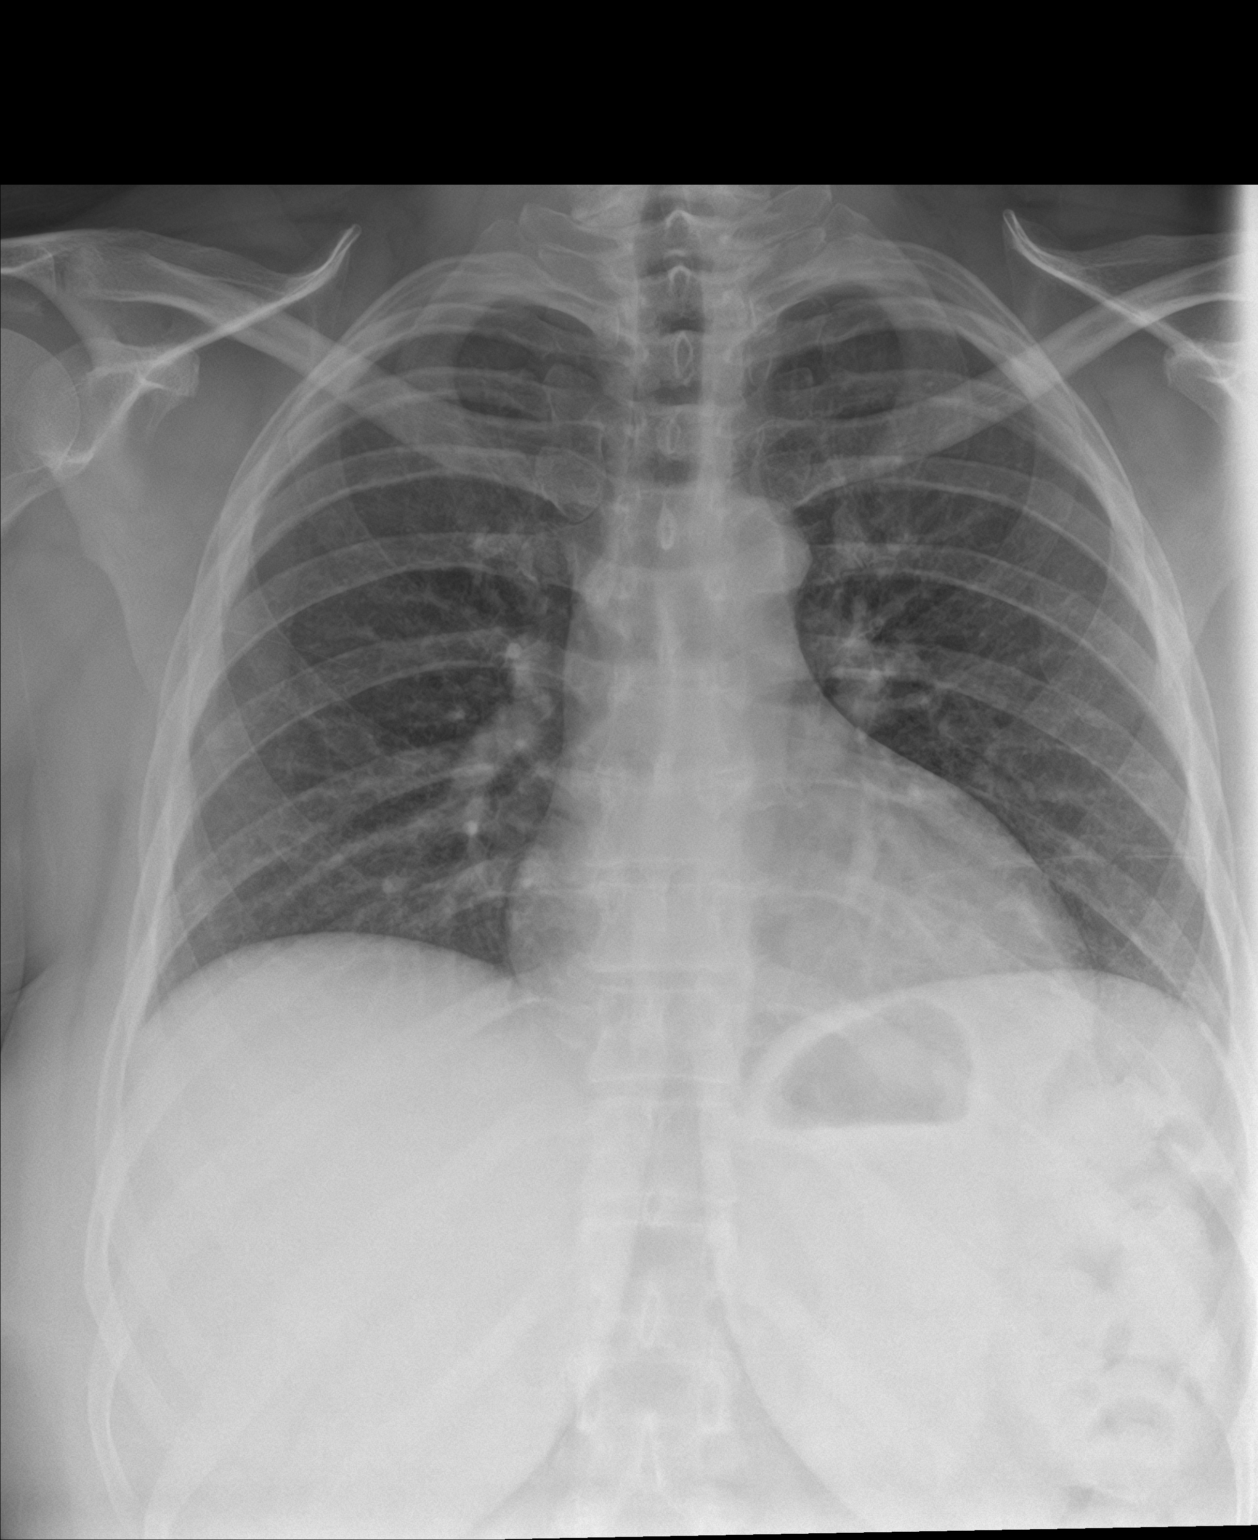

[chest lat]
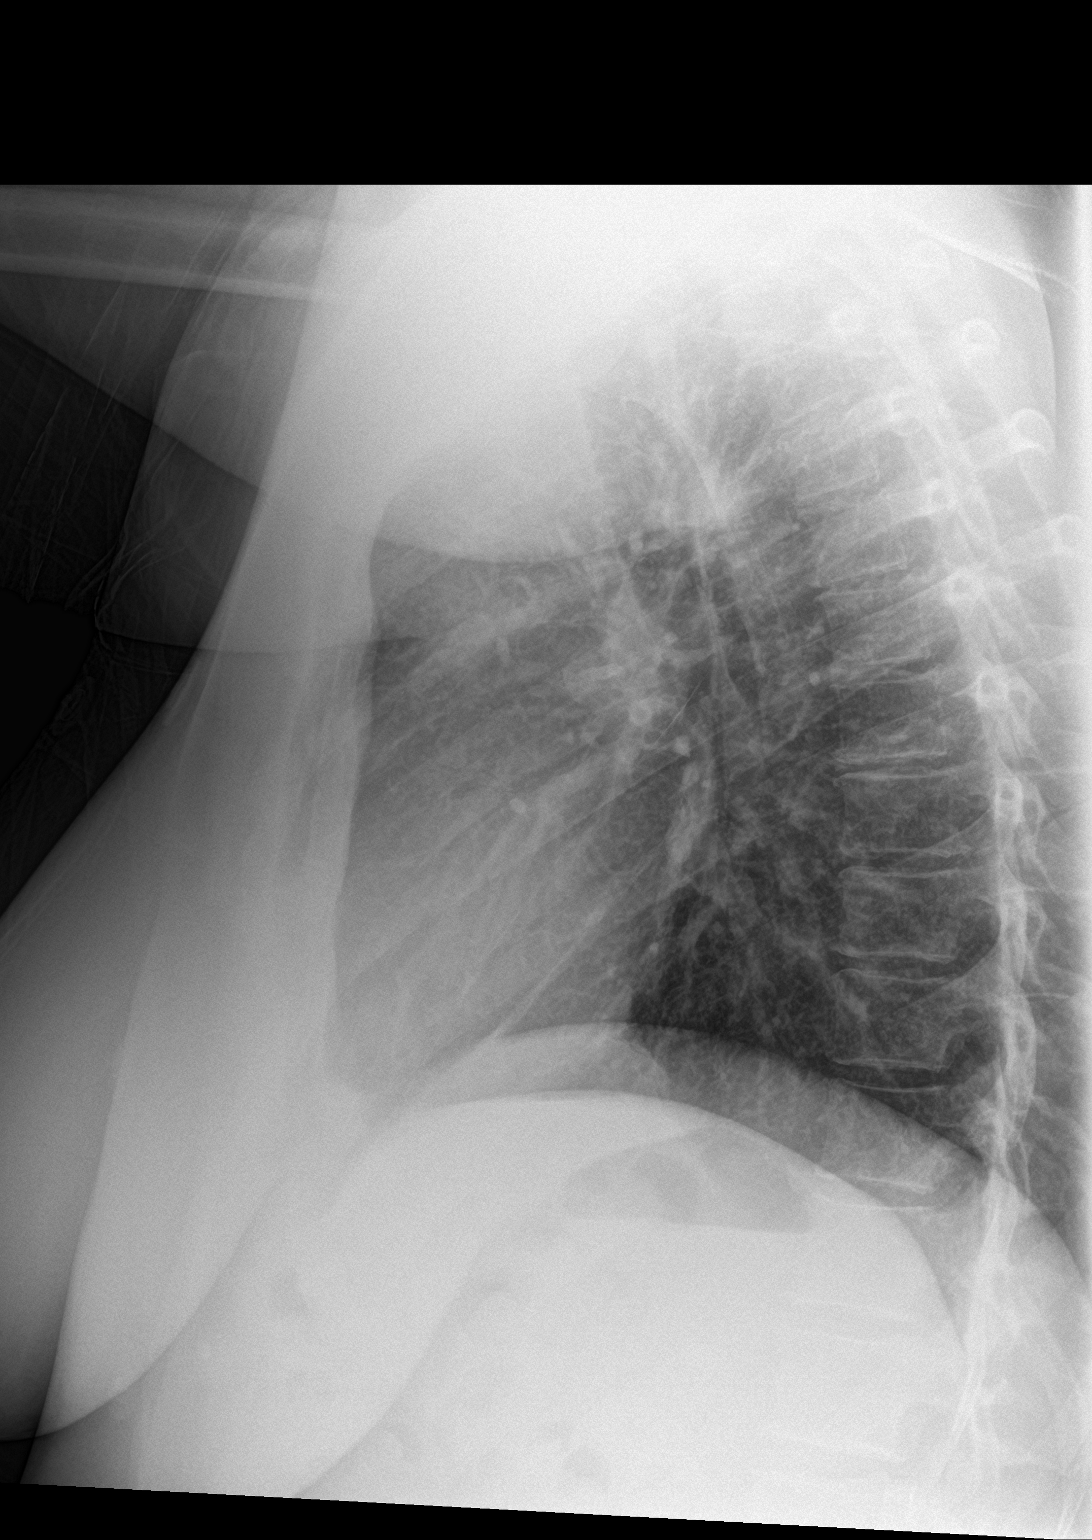

[2 of 2 positions shown; findings below may reference images not displayed]

FINDINGS: Lung volumes are normal. No consolidative airspace disease. No
pleural effusions. No pneumothorax. No pulmonary nodule or mass
noted. Pulmonary vasculature and the cardiomediastinal silhouette
are within normal limits.
IMPRESSION: 1.  No radiographic evidence of acute cardiopulmonary disease.

## 2022-05-08 IMAGING — US US ABDOMEN LIMITED
1 series · 14 of 25 positions shown · non-contrast
Comparison: CTA PE, 01/10/2020.

CLINICAL DATA: RIGHT upper quadrant pain.

EXAM:
ULTRASOUND ABDOMEN LIMITED RIGHT UPPER QUADRANT

[Series 1: us abdomen limited ruq (liver/gb) · 14 of 47 slices shown]
[im 1/47]
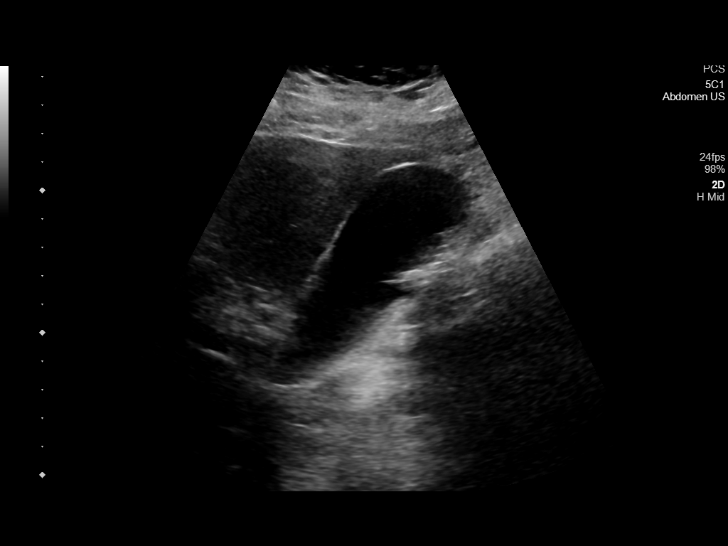
[im 4/47]
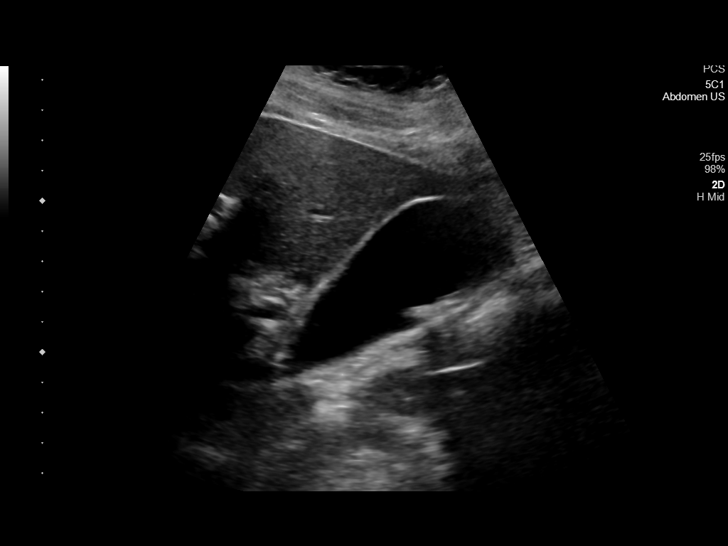
[im 8/47]
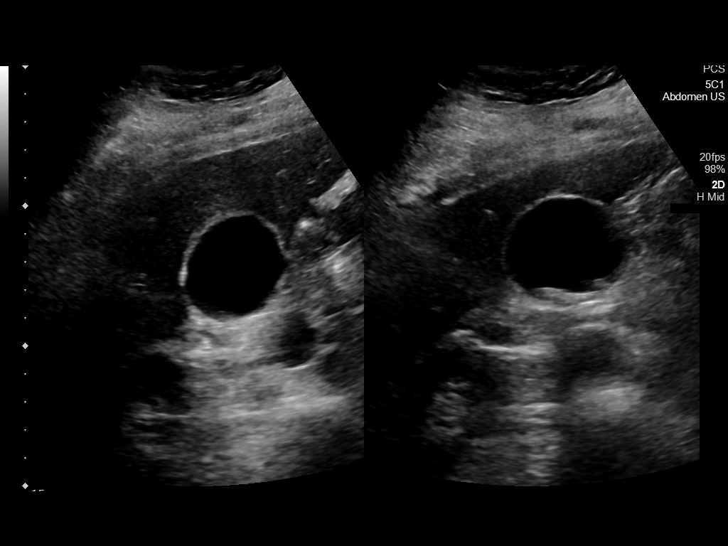
[im 12/47]
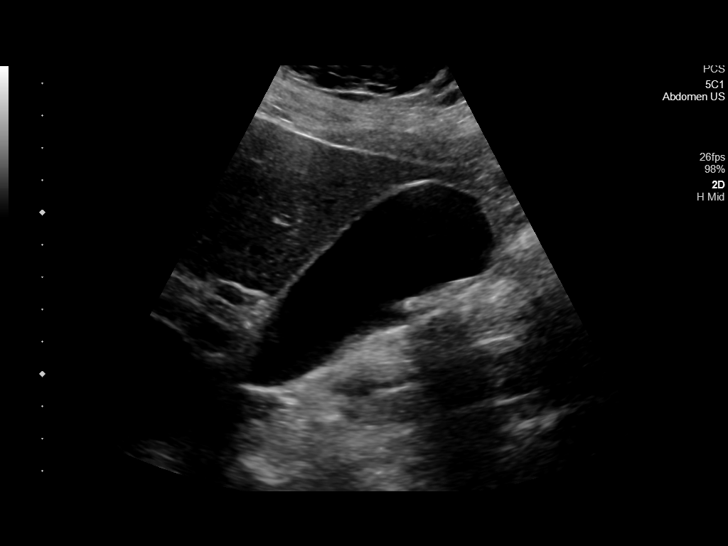
[im 16/47]
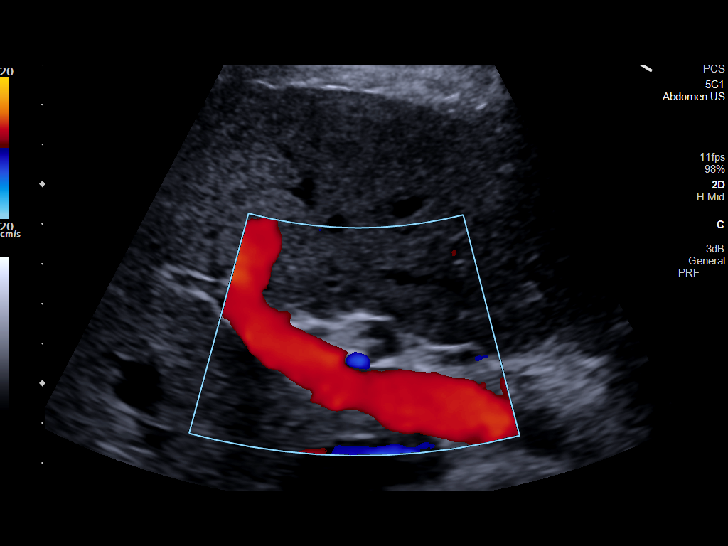
[im 18/47]
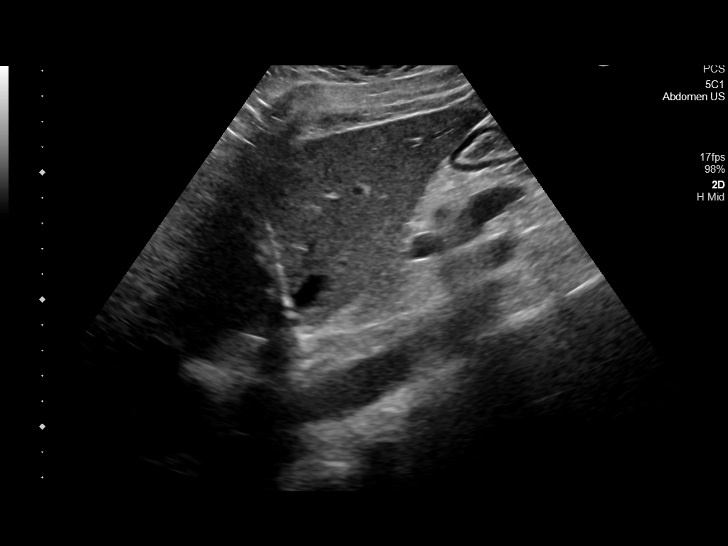
[im 22/47]
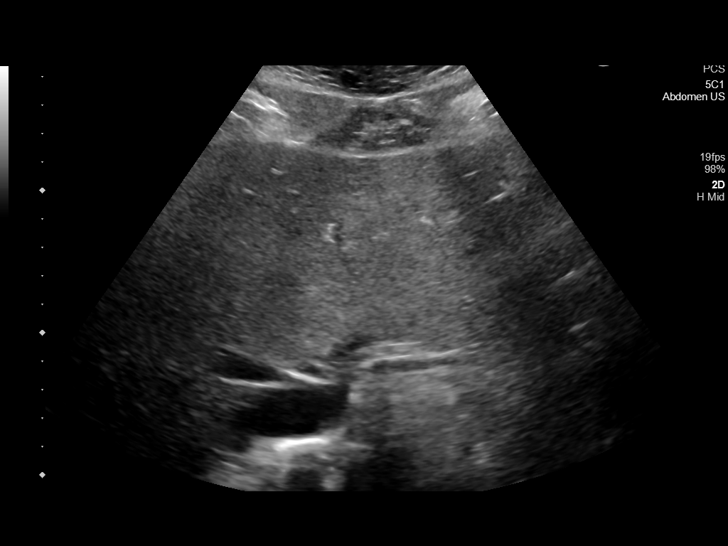
[im 25/47]
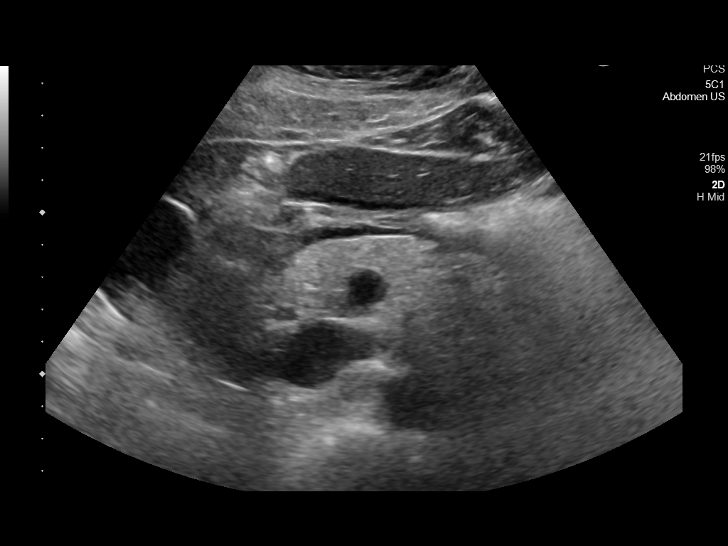
[im 29/47]
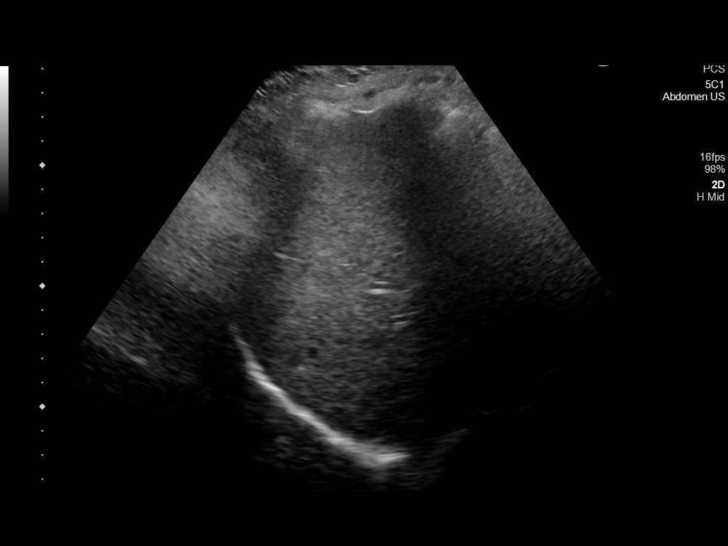
[im 31/47]
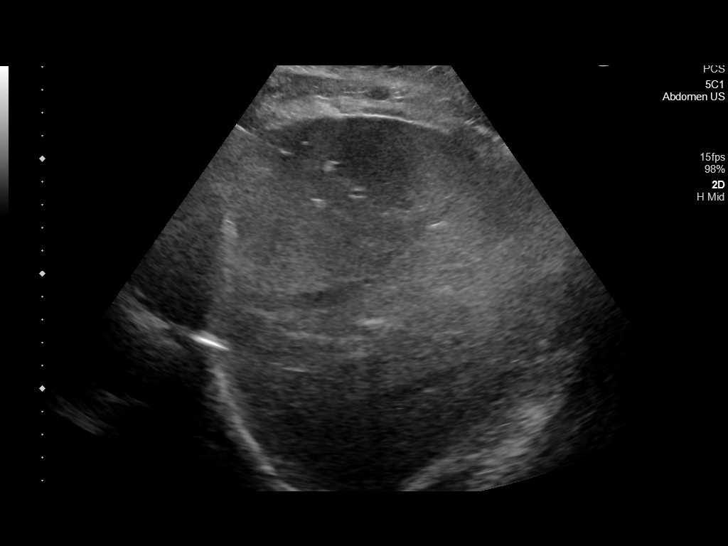
[im 35/47]
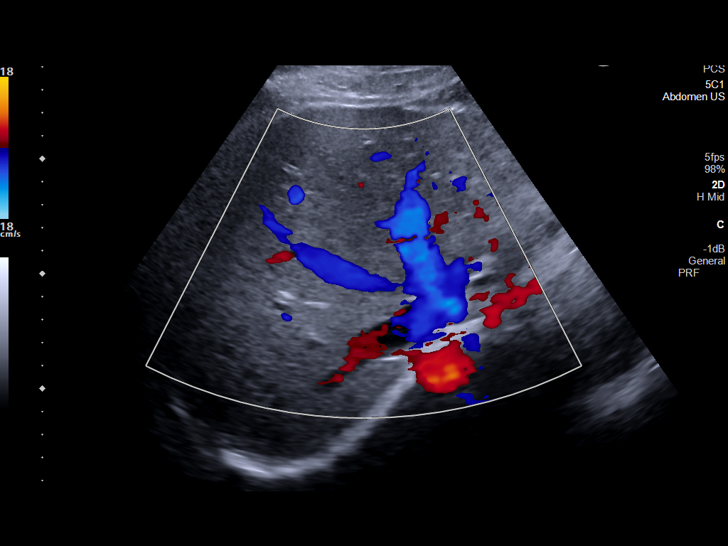
[im 39/47]
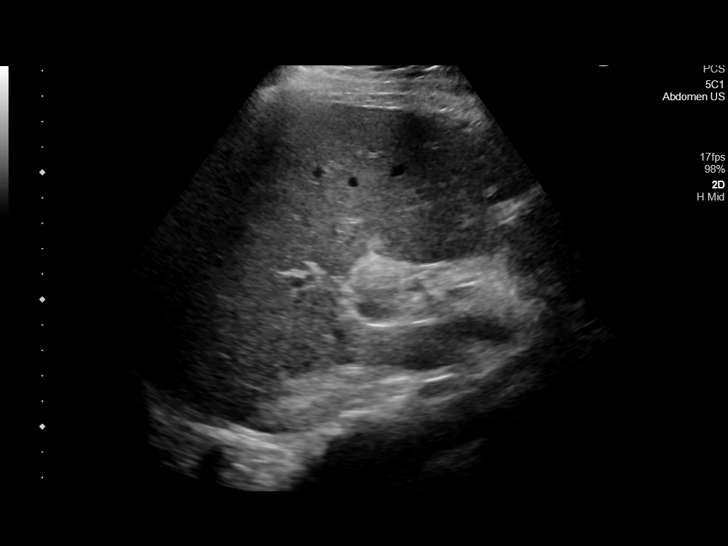
[im 43/47]
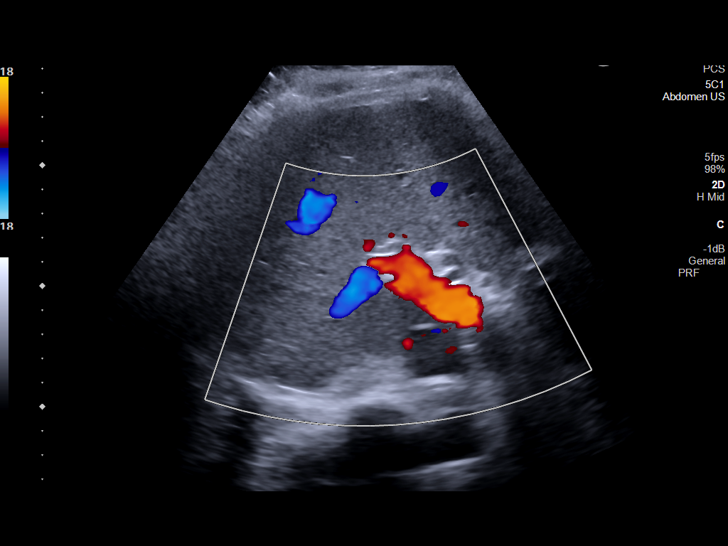
[im 47/47]
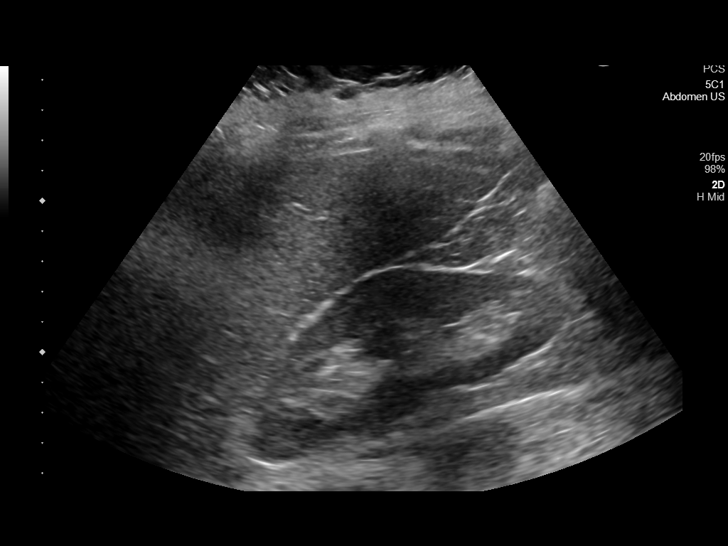

[14 of 25 positions shown; findings below may reference images not displayed]

FINDINGS: Gallbladder:

Dependent echogenic gallstones within a mildly distended
gallbladder, with largest stone measuring up to 1.3 cm. No discrete
gallbladder wall thickening or pericholecystic fluid. A positive
sonographic Murphy sign was reported by sonographer.

Common bile duct:

Diameter: 0.4 cm

Liver:

No focal lesion identified. Within normal limits in parenchymal
echogenicity. Portal vein is patent on color Doppler imaging with
normal direction of blood flow towards the liver.

Other: No perihepatic ascites
IMPRESSION: Mildly distended gallbladder with multiple stones. Positive
sonographic Murphy's sign.

Findings suspicious for acute calculus cholecystitis.

## 2022-05-08 MED ORDER — ONDANSETRON 4 MG PO TBDP
ORAL_TABLET | ORAL | 1 refills | Status: AC
  Filled 2022-05-08: qty 30, 10d supply, fill #0
  Filled 2022-08-02: qty 30, 10d supply, fill #1

## 2022-05-19 ENCOUNTER — Encounter: Payer: Self-pay | Admitting: Nurse Practitioner

## 2022-05-19 LAB — INSULIN, FREE: INSULIN: 16.9

## 2022-05-22 ENCOUNTER — Other Ambulatory Visit (HOSPITAL_COMMUNITY): Payer: Self-pay

## 2022-05-22 DIAGNOSIS — Z6834 Body mass index (BMI) 34.0-34.9, adult: Secondary | ICD-10-CM | POA: Diagnosis not present

## 2022-05-22 DIAGNOSIS — E785 Hyperlipidemia, unspecified: Secondary | ICD-10-CM | POA: Diagnosis not present

## 2022-05-22 MED ORDER — WEGOVY 1.7 MG/0.75ML ~~LOC~~ SOAJ
SUBCUTANEOUS | 0 refills | Status: DC
  Filled 2022-05-22: qty 3, 28d supply, fill #0

## 2022-05-27 ENCOUNTER — Encounter: Payer: Self-pay | Admitting: Nurse Practitioner

## 2022-05-27 ENCOUNTER — Other Ambulatory Visit (HOSPITAL_COMMUNITY): Payer: Self-pay

## 2022-05-31 ENCOUNTER — Other Ambulatory Visit (HOSPITAL_COMMUNITY): Payer: Self-pay

## 2022-06-04 ENCOUNTER — Ambulatory Visit: Payer: 59 | Admitting: Nurse Practitioner

## 2022-06-04 ENCOUNTER — Encounter: Payer: Self-pay | Admitting: Nurse Practitioner

## 2022-06-04 ENCOUNTER — Other Ambulatory Visit (HOSPITAL_COMMUNITY): Payer: Self-pay

## 2022-06-04 VITALS — BP 102/70 | HR 86 | Temp 97.3°F | Ht 68.0 in | Wt 226.5 lb

## 2022-06-04 DIAGNOSIS — Z79899 Other long term (current) drug therapy: Secondary | ICD-10-CM

## 2022-06-04 DIAGNOSIS — F900 Attention-deficit hyperactivity disorder, predominantly inattentive type: Secondary | ICD-10-CM

## 2022-06-04 MED ORDER — LISDEXAMFETAMINE DIMESYLATE 30 MG PO CAPS
30.0000 mg | ORAL_CAPSULE | Freq: Every day | ORAL | 0 refills | Status: DC
  Filled 2022-06-04: qty 30, 30d supply, fill #0

## 2022-06-04 NOTE — Assessment & Plan Note (Signed)
Patient is to be followed by psychiatry with an official diagnosis able to see notes in epic.  We will start patient back on Vyvanse that she goes there is being off of it we will start her on 30 mg and titrate up to 40 mg that she used to be on.

## 2022-06-04 NOTE — Assessment & Plan Note (Signed)
Discussed controlled substance agreement and UDS.  Obtain UDS in office today.

## 2022-06-04 NOTE — Progress Notes (Signed)
   Established Patient Office Visit  Subjective   Patient ID: Sherri Miller, female    DOB: [birth year]  Age: 46 y.o. MRN: [id number]  Chief Complaint  Patient presents with   medication discussion    Vyvanse    HPI  ADD: States that she took vyvanse for ADHD taken off for the naltrexone medication that she was prescribed to aid in weight loss States that when she was on the vyvanse and did not have any adverese drug events.  States that being off the medicatoin she states that he rhouse is messy and she knpws she needs to clean it but no motivation. Some concentratoin issues with work also    Review of Systems  Constitutional:  Negative for chills and fever.  Respiratory:  Negative for shortness of breath.   Cardiovascular:  Negative for chest pain.  Gastrointestinal:  Negative for abdominal pain, constipation, nausea and vomiting.      Objective:     BP 102/70   Pulse 86   Temp (!) 97.3 F (36.3 C) (Temporal)   Ht 5\' 8"  (1.727 m)   Wt 226 lb 8 oz (102.7 kg)   SpO2 97%   BMI 34.44 kg/m  BP Readings from Last 3 Encounters:  06/04/22 102/70  03/28/22 124/78  10/15/21 133/87   Wt Readings from Last 3 Encounters:  06/04/22 226 lb 8 oz (102.7 kg)  03/28/22 239 lb 9 oz (108.7 kg)  10/15/21 232 lb (105.2 kg)      Physical Exam Vitals and nursing note reviewed.  Constitutional:      Appearance: Normal appearance. She is obese.  Cardiovascular:     Rate and Rhythm: Normal rate and regular rhythm.     Heart sounds: Normal heart sounds.  Pulmonary:     Effort: Pulmonary effort is normal.     Breath sounds: Normal breath sounds.  Abdominal:     General: Bowel sounds are normal.  Neurological:     Mental Status: She is alert.      No results found for any visits on 06/04/22.    The 10-year ASCVD risk score (Arnett DK, et al., 2019) is: 0.5%* (Cholesterol units were assumed)    Assessment & Plan:   Problem List Items Addressed This Visit       Other    Attention deficit hyperactivity disorder (ADHD) - Primary    Patient is to be followed by psychiatry with an official diagnosis able to see notes in epic.  We will start patient back on Vyvanse that she goes there is being off of it we will start her on 30 mg and titrate up to 40 mg that she used to be on.      Relevant Medications   lisdexamfetamine (VYVANSE) 30 MG capsule   High risk medication use    Discussed controlled substance agreement and UDS.  Obtain UDS in office today.      Relevant Orders   DRUG MONITORING, PANEL 8 WITH CONFIRMATION, URINE    Return in about 6 months (around 12/04/2022) for Recheck/CPE.    12/06/2022, NP

## 2022-06-04 NOTE — Patient Instructions (Signed)
Nice to see you today I will need to see you in 6 months, sooner if you need me  

## 2022-06-05 ENCOUNTER — Other Ambulatory Visit (HOSPITAL_COMMUNITY): Payer: Self-pay

## 2022-06-07 LAB — DRUG MONITORING, PANEL 8 WITH CONFIRMATION, URINE
6 Acetylmorphine: NEGATIVE ng/mL (ref <10)
Alcohol Metabolites: NEGATIVE ng/mL (ref <500)
Amphetamine: 934 ng/mL — ABNORMAL HIGH (ref <250)
Amphetamines: POSITIVE ng/mL — AB (ref <500)
Benzodiazepines: NEGATIVE ng/mL (ref <100)
Buprenorphine, Urine: NEGATIVE ng/mL (ref <5)
Cocaine Metabolite: NEGATIVE ng/mL (ref <150)
Creatinine: 38 mg/dL (ref >=20.0)
MDMA: NEGATIVE ng/mL (ref <500)
Marijuana Metabolite: NEGATIVE ng/mL (ref <20)
Methamphetamine: NEGATIVE ng/mL (ref <250)
Opiates: NEGATIVE ng/mL (ref <100)
Oxidant: NEGATIVE ug/mL (ref <200)
Oxycodone: NEGATIVE ng/mL (ref <100)
pH: 6.1 (ref 4.5–9.0)

## 2022-06-07 LAB — DM TEMPLATE

## 2022-06-23 ENCOUNTER — Other Ambulatory Visit (HOSPITAL_COMMUNITY): Payer: Self-pay

## 2022-06-23 MED ORDER — WEGOVY 1.7 MG/0.75ML ~~LOC~~ SOAJ
SUBCUTANEOUS | 0 refills | Status: DC
  Filled 2022-06-23: qty 3, 28d supply, fill #0

## 2022-06-24 ENCOUNTER — Other Ambulatory Visit (HOSPITAL_COMMUNITY): Payer: Self-pay

## 2022-06-27 ENCOUNTER — Telehealth: Payer: 59 | Admitting: Family Medicine

## 2022-06-27 DIAGNOSIS — J208 Acute bronchitis due to other specified organisms: Secondary | ICD-10-CM

## 2022-06-27 MED ORDER — BENZONATATE 200 MG PO CAPS: 200.0000 mg | ORAL_CAPSULE | Freq: Two times a day (BID) | ORAL | 0 refills | Status: DC | PRN

## 2022-06-27 MED ORDER — PREDNISONE 10 MG PO TABS: 10.0000 mg | ORAL_TABLET | Freq: Every day | ORAL | 0 refills | Status: AC

## 2022-06-27 NOTE — Progress Notes (Signed)
We are sorry that you are not feeling well.  Here is how we plan to help!  Based on your presentation I believe you most likely have A cough due to a virus.  This is called viral bronchitis and is best treated by rest, plenty of fluids and control of the cough.  You may use Ibuprofen or Tylenol as directed to help your symptoms.     In addition you may use A prescription cough medication called Tessalon Perles 100mg . You may take 1-2 capsules every 8 hours as needed for your cough.  Prednisone 5 mg daily for 6 days (see taper instructions below)  From your responses in the eVisit questionnaire you describe inflammation in the upper respiratory tract which is causing a significant cough.  This is commonly called Bronchitis and has four common causes:   Allergies Viral Infections Acid Reflux Bacterial Infection Allergies, viruses and acid reflux are treated by controlling symptoms or eliminating the cause. An example might be a cough caused by taking certain blood pressure medications. You stop the cough by changing the medication. Another example might be a cough caused by acid reflux. Controlling the reflux helps control the cough.  USE OF BRONCHODILATOR ("RESCUE") INHALERS: There is a risk from using your bronchodilator too frequently.  The risk is that over-reliance on a medication which only relaxes the muscles surrounding the breathing tubes can reduce the effectiveness of medications prescribed to reduce swelling and congestion of the tubes themselves.  Although you feel brief relief from the bronchodilator inhaler, your asthma may actually be worsening with the tubes becoming more swollen and filled with mucus.  This can delay other crucial treatments, such as oral steroid medications. If you need to use a bronchodilator inhaler daily, several times per day, you should discuss this with your provider.  There are probably better treatments that could be used to keep your asthma under control.      HOME CARE Only take medications as instructed by your medical team. Complete the entire course of an antibiotic. Drink plenty of fluids and get plenty of rest. Avoid close contacts especially the very young and the elderly Cover your mouth if you cough or cough into your sleeve. Always remember to wash your hands A steam or ultrasonic humidifier can help congestion.   GET HELP RIGHT AWAY IF: You develop worsening fever. You become short of breath You cough up blood. Your symptoms persist after you have completed your treatment plan MAKE SURE YOU  Understand these instructions. Will watch your condition. Will get help right away if you are not doing well or get worse.    Thank you for choosing an e-visit.  Your e-visit answers were reviewed by a board certified advanced clinical practitioner to complete your personal care plan. Depending upon the condition, your plan could have included both over the counter or prescription medications.  Please review your pharmacy choice. Make sure the pharmacy is open so you can pick up prescription now. If there is a problem, you may contact your provider through [employer] and have the prescription routed to another pharmacy.  Your safety is important to [place]. If you have drug allergies check your prescription carefully.   For the next 24 hours you can use MyChart to ask questions about today's visit, request a non-urgent call back, or ask for a work or school excuse. You will get an email in the next two days asking about your experience. I hope that your e-visit has been  valuable and will speed your recovery.   I have provided 5 minutes of non face to face time during this encounter for chart review and documentation.

## 2022-06-30 ENCOUNTER — Encounter: Payer: Self-pay | Admitting: Nurse Practitioner

## 2022-06-30 ENCOUNTER — Other Ambulatory Visit: Payer: Self-pay | Admitting: Pulmonary Disease

## 2022-06-30 ENCOUNTER — Other Ambulatory Visit: Payer: Self-pay | Admitting: Nurse Practitioner

## 2022-06-30 DIAGNOSIS — R0602 Shortness of breath: Secondary | ICD-10-CM

## 2022-06-30 DIAGNOSIS — G4700 Insomnia, unspecified: Secondary | ICD-10-CM

## 2022-07-01 ENCOUNTER — Ambulatory Visit: Payer: 59 | Admitting: Family Medicine

## 2022-07-01 ENCOUNTER — Encounter: Payer: Self-pay | Admitting: Family Medicine

## 2022-07-01 ENCOUNTER — Other Ambulatory Visit (HOSPITAL_COMMUNITY): Payer: Self-pay

## 2022-07-01 VITALS — BP 100/70 | HR 78 | Temp 97.6°F | Ht 68.0 in | Wt 223.0 lb

## 2022-07-01 DIAGNOSIS — Z9189 Other specified personal risk factors, not elsewhere classified: Secondary | ICD-10-CM

## 2022-07-01 DIAGNOSIS — B379 Candidiasis, unspecified: Secondary | ICD-10-CM

## 2022-07-01 DIAGNOSIS — T3695XA Adverse effect of unspecified systemic antibiotic, initial encounter: Secondary | ICD-10-CM | POA: Diagnosis not present

## 2022-07-01 DIAGNOSIS — J309 Allergic rhinitis, unspecified: Secondary | ICD-10-CM | POA: Diagnosis not present

## 2022-07-01 DIAGNOSIS — R053 Chronic cough: Secondary | ICD-10-CM | POA: Diagnosis not present

## 2022-07-01 MED ORDER — HYDROCODONE BIT-HOMATROP MBR 5-1.5 MG/5ML PO SOLN
5.0000 mL | Freq: Three times a day (TID) | ORAL | 0 refills | Status: DC | PRN
  Filled 2022-07-01: qty 120, 8d supply, fill #0

## 2022-07-01 MED ORDER — ALBUTEROL SULFATE HFA 108 (90 BASE) MCG/ACT IN AERS
2.0000 | INHALATION_SPRAY | Freq: Four times a day (QID) | RESPIRATORY_TRACT | 1 refills | Status: DC | PRN
  Filled 2022-07-01: qty 18, 30d supply, fill #0

## 2022-07-01 MED ORDER — FAMOTIDINE 20 MG PO TABS
20.0000 mg | ORAL_TABLET | Freq: Two times a day (BID) | ORAL | 0 refills | Status: DC
  Filled 2022-07-01 – 2022-07-14 (×3): qty 180, 90d supply, fill #0

## 2022-07-01 MED ORDER — FLUTICASONE-SALMETEROL 250-50 MCG/ACT IN AEPB
1.0000 | INHALATION_SPRAY | Freq: Two times a day (BID) | RESPIRATORY_TRACT | 1 refills | Status: DC
  Filled 2022-07-01: qty 60, 30d supply, fill #0
  Filled 2022-08-02: qty 60, 30d supply, fill #1

## 2022-07-01 MED ORDER — FAMOTIDINE 20 MG PO TABS
20.0000 mg | ORAL_TABLET | Freq: Two times a day (BID) | ORAL | 1 refills | Status: DC
  Filled 2022-07-01: qty 60, 30d supply, fill #0

## 2022-07-01 MED ORDER — FLUCONAZOLE 150 MG PO TABS
150.0000 mg | ORAL_TABLET | Freq: Once | ORAL | 0 refills | Status: AC
  Filled 2022-07-01: qty 1, 1d supply, fill #0

## 2022-07-01 MED ORDER — AZITHROMYCIN 500 MG PO TABS
1000.0000 mg | ORAL_TABLET | Freq: Once | ORAL | 0 refills | Status: AC
  Filled 2022-07-01: qty 2, 1d supply, fill #0

## 2022-07-01 MED ORDER — ALBUTEROL SULFATE HFA 108 (90 BASE) MCG/ACT IN AERS
2.0000 | INHALATION_SPRAY | Freq: Four times a day (QID) | RESPIRATORY_TRACT | 2 refills | Status: DC | PRN
  Filled 2022-07-01: qty 6.7, 25d supply, fill #0

## 2022-07-01 NOTE — Assessment & Plan Note (Signed)
She is anticipating traveling to [place] with questionable water supply.  Azithromycin provided to take if needed for traveler's diarrhea.

## 2022-07-01 NOTE — Assessment & Plan Note (Signed)
Patient with chronic recurrent cough, typically triggered by viral syndrome with persisting bronchitis.  She feels recent flare may have been associated with no longer having her famotidine, this was refilled.  Refill albuterol, Advair.  She takes these as needed during cold season.  Prescription for Hycodan for nighttime cough.  Update if no improvement

## 2022-07-01 NOTE — Progress Notes (Signed)
Subjective:     Sherri Miller is a 46 y.o. female presenting for Cough     Cough    #chronic Bronchitis - was on pepcid bid for prevention  - refill ran out - was discussing weaning down - steroids don't help - had to take klonopin - 06/21/2022 was when symptoms started -   Covid negative Minimal improvement with steroids  Is traveling to [place] and worried about traveler's diarrhea   Review of Systems  Respiratory:  Positive for cough.      Social History   Tobacco Use  Smoking Status Never  Smokeless Tobacco Never        Objective:    BP Readings from Last 3 Encounters:  07/01/22 100/70  06/04/22 102/70  03/28/22 124/78   Wt Readings from Last 3 Encounters:  07/01/22 223 lb (101.2 kg)  06/04/22 226 lb 8 oz (102.7 kg)  03/28/22 239 lb 9 oz (108.7 kg)    BP 100/70   Pulse 78   Temp 97.6 F (36.4 C) (Temporal)   Ht 5\' 8"  (1.727 m)   Wt 223 lb (101.2 kg)   SpO2 98%   BMI 33.91 kg/m    Physical Exam Constitutional:      General: She is not in acute distress.    Appearance: She is well-developed. She is not diaphoretic.  HENT:     Head: Normocephalic and atraumatic.     Right Ear: Tympanic membrane and ear canal normal.     Left Ear: Tympanic membrane and ear canal normal.     Nose: Mucosal edema and rhinorrhea present.     Right Sinus: No maxillary sinus tenderness or frontal sinus tenderness.     Left Sinus: No maxillary sinus tenderness or frontal sinus tenderness.     Mouth/Throat:     Pharynx: Uvula midline. Posterior oropharyngeal erythema present. No oropharyngeal exudate.     Tonsils: 0 on the right. 0 on the left.  Eyes:     General: No scleral icterus.    Conjunctiva/sclera: Conjunctivae normal.  Cardiovascular:     Rate and Rhythm: Normal rate and regular rhythm.     Heart sounds: Normal heart sounds. No murmur heard. Pulmonary:     Effort: Pulmonary effort is normal. No respiratory distress.     Breath sounds: Normal  breath sounds. No wheezing or rhonchi.  Musculoskeletal:     Cervical back: Neck supple.  Lymphadenopathy:     Cervical: No cervical adenopathy.  Skin:    General: Skin is warm and dry.     Capillary Refill: Capillary refill takes less than 2 seconds.  Neurological:     Mental Status: She is alert.           Assessment & Plan:   Problem List Items Addressed This Visit       Respiratory   Allergic rhinitis - Primary   Relevant Medications   HYDROcodone bit-homatropine (HYCODAN) 5-1.5 MG/5ML syrup   famotidine (PEPCID) 20 MG tablet   fluticasone-salmeterol (ADVAIR DISKUS) 250-50 MCG/ACT AEPB     Other   Cough    Patient with chronic recurrent cough, typically triggered by viral syndrome with persisting bronchitis.  She feels recent flare may have been associated with no longer having her famotidine, this was refilled.  Refill albuterol, Advair.  She takes these as needed during cold season.  Prescription for Hycodan for nighttime cough.  Update if no improvement      Relevant Medications   HYDROcodone bit-homatropine (  HYCODAN) 5-1.5 MG/5ML syrup   famotidine (PEPCID) 20 MG tablet   fluticasone-salmeterol (ADVAIR DISKUS) 250-50 MCG/ACT AEPB   At risk for infectious disease due to recent foreign travel    She is anticipating traveling to [place] with questionable water supply.  Azithromycin provided to take if needed for traveler's diarrhea.      Relevant Medications   azithromycin (ZITHROMAX) 500 MG tablet   Other Visit Diagnoses     Antibiotic-induced yeast infection       Relevant Medications   azithromycin (ZITHROMAX) 500 MG tablet   fluconazole (DIFLUCAN) 150 MG tablet        Return if symptoms worsen or fail to improve.  Lynnda Child, MD

## 2022-07-02 ENCOUNTER — Other Ambulatory Visit (HOSPITAL_COMMUNITY): Payer: Self-pay

## 2022-07-09 ENCOUNTER — Other Ambulatory Visit: Payer: Self-pay | Admitting: Pulmonary Disease

## 2022-07-09 ENCOUNTER — Other Ambulatory Visit: Payer: Self-pay | Admitting: Nurse Practitioner

## 2022-07-09 DIAGNOSIS — F900 Attention-deficit hyperactivity disorder, predominantly inattentive type: Secondary | ICD-10-CM

## 2022-07-10 ENCOUNTER — Other Ambulatory Visit (HOSPITAL_COMMUNITY): Payer: Self-pay

## 2022-07-10 NOTE — Telephone Encounter (Signed)
See mychart response from patient I sent to you but patient said she will get the medication this time and see whats going on with the insurance. Nothing else is needed at this time.

## 2022-07-10 NOTE — Telephone Encounter (Signed)
Left message and sent my chart message  

## 2022-07-11 ENCOUNTER — Other Ambulatory Visit: Payer: Self-pay | Admitting: Nurse Practitioner

## 2022-07-11 ENCOUNTER — Other Ambulatory Visit (HOSPITAL_COMMUNITY): Payer: Self-pay

## 2022-07-11 DIAGNOSIS — F900 Attention-deficit hyperactivity disorder, predominantly inattentive type: Secondary | ICD-10-CM

## 2022-07-11 MED ORDER — LISDEXAMFETAMINE DIMESYLATE 40 MG PO CAPS
40.0000 mg | ORAL_CAPSULE | ORAL | 0 refills | Status: DC
  Filled 2022-07-11: qty 30, 30d supply, fill #0

## 2022-07-13 ENCOUNTER — Other Ambulatory Visit (HOSPITAL_COMMUNITY): Payer: Self-pay

## 2022-07-14 ENCOUNTER — Other Ambulatory Visit (HOSPITAL_COMMUNITY): Payer: Self-pay

## 2022-07-14 ENCOUNTER — Other Ambulatory Visit: Payer: Self-pay | Admitting: Nurse Practitioner

## 2022-07-14 ENCOUNTER — Other Ambulatory Visit: Payer: Self-pay | Admitting: Pulmonary Disease

## 2022-07-14 DIAGNOSIS — G4700 Insomnia, unspecified: Secondary | ICD-10-CM

## 2022-07-14 MED ORDER — TRAZODONE HCL 150 MG PO TABS
150.0000 mg | ORAL_TABLET | Freq: Every day | ORAL | 1 refills | Status: DC
  Filled 2022-07-14 – 2022-10-04 (×2): qty 90, 90d supply, fill #0
  Filled 2022-12-29: qty 90, 90d supply, fill #1

## 2022-07-14 MED ORDER — APIXABAN 5 MG PO TABS
ORAL_TABLET | Freq: Two times a day (BID) | ORAL | 3 refills | Status: DC
Start: 2022-07-14 — End: 2023-04-16
  Filled 2022-07-14: qty 180, 90d supply, fill #0
  Filled 2022-11-03: qty 180, 90d supply, fill #1
  Filled 2023-02-05: qty 180, 90d supply, fill #2

## 2022-07-14 NOTE — Addendum Note (Signed)
Addended by: Consuella Lose on: 07/14/2022 04:06 PM   Modules accepted: Orders

## 2022-07-15 ENCOUNTER — Other Ambulatory Visit (HOSPITAL_COMMUNITY): Payer: Self-pay

## 2022-07-15 DIAGNOSIS — E785 Hyperlipidemia, unspecified: Secondary | ICD-10-CM | POA: Diagnosis not present

## 2022-07-15 DIAGNOSIS — Z6833 Body mass index (BMI) 33.0-33.9, adult: Secondary | ICD-10-CM | POA: Diagnosis not present

## 2022-07-16 ENCOUNTER — Other Ambulatory Visit (HOSPITAL_COMMUNITY): Payer: Self-pay

## 2022-07-16 MED ORDER — WEGOVY 1.7 MG/0.75ML ~~LOC~~ SOAJ
SUBCUTANEOUS | 0 refills | Status: DC
  Filled 2022-07-16: qty 3, 28d supply, fill #0

## 2022-07-17 ENCOUNTER — Other Ambulatory Visit (HOSPITAL_COMMUNITY): Payer: Self-pay

## 2022-07-18 ENCOUNTER — Other Ambulatory Visit (HOSPITAL_COMMUNITY): Payer: Self-pay

## 2022-08-02 ENCOUNTER — Other Ambulatory Visit (HOSPITAL_COMMUNITY): Payer: Self-pay

## 2022-08-04 ENCOUNTER — Other Ambulatory Visit (HOSPITAL_COMMUNITY): Payer: Self-pay

## 2022-08-04 DIAGNOSIS — R7989 Other specified abnormal findings of blood chemistry: Secondary | ICD-10-CM | POA: Diagnosis not present

## 2022-08-04 DIAGNOSIS — R635 Abnormal weight gain: Secondary | ICD-10-CM | POA: Diagnosis not present

## 2022-08-04 DIAGNOSIS — E038 Other specified hypothyroidism: Secondary | ICD-10-CM | POA: Diagnosis not present

## 2022-08-04 DIAGNOSIS — N951 Menopausal and female climacteric states: Secondary | ICD-10-CM | POA: Diagnosis not present

## 2022-08-04 DIAGNOSIS — Z131 Encounter for screening for diabetes mellitus: Secondary | ICD-10-CM | POA: Diagnosis not present

## 2022-08-04 DIAGNOSIS — E785 Hyperlipidemia, unspecified: Secondary | ICD-10-CM | POA: Diagnosis not present

## 2022-08-04 DIAGNOSIS — Z6832 Body mass index (BMI) 32.0-32.9, adult: Secondary | ICD-10-CM | POA: Diagnosis not present

## 2022-08-04 DIAGNOSIS — E559 Vitamin D deficiency, unspecified: Secondary | ICD-10-CM | POA: Diagnosis not present

## 2022-08-04 MED ORDER — WEGOVY 1.7 MG/0.75ML ~~LOC~~ SOAJ
1.7000 mg | SUBCUTANEOUS | 2 refills | Status: DC
  Filled 2022-08-05 – 2022-08-08 (×3): qty 3, 28d supply, fill #0
  Filled 2022-09-24 – 2022-09-26 (×2): qty 3, 28d supply, fill #1

## 2022-08-05 ENCOUNTER — Other Ambulatory Visit (HOSPITAL_COMMUNITY): Payer: Self-pay

## 2022-08-05 ENCOUNTER — Encounter: Payer: Self-pay | Admitting: Obstetrics and Gynecology

## 2022-08-05 MED ORDER — DULOXETINE HCL 60 MG PO CPEP
60.0000 mg | ORAL_CAPSULE | Freq: Two times a day (BID) | ORAL | 1 refills | Status: DC
  Filled 2022-08-05 – 2022-10-16 (×2): qty 180, 90d supply, fill #0
  Filled 2023-02-05: qty 180, 90d supply, fill #1

## 2022-08-05 MED ORDER — LISDEXAMFETAMINE DIMESYLATE 30 MG PO CAPS
30.0000 mg | ORAL_CAPSULE | Freq: Every day | ORAL | 0 refills | Status: DC
  Filled 2022-08-05 – 2022-09-09 (×4): qty 30, 30d supply, fill #0

## 2022-08-05 NOTE — Addendum Note (Signed)
Addended by: Eden Emms on: 08/05/2022 10:14 AM   Modules accepted: Orders

## 2022-08-08 ENCOUNTER — Other Ambulatory Visit (HOSPITAL_COMMUNITY): Payer: Self-pay

## 2022-08-09 ENCOUNTER — Other Ambulatory Visit (HOSPITAL_COMMUNITY): Payer: Self-pay

## 2022-08-23 ENCOUNTER — Other Ambulatory Visit (HOSPITAL_COMMUNITY): Payer: Self-pay

## 2022-08-28 ENCOUNTER — Other Ambulatory Visit (HOSPITAL_COMMUNITY): Payer: Self-pay

## 2022-08-28 MED ORDER — LIOTHYRONINE SODIUM 5 MCG PO TABS
10.0000 ug | ORAL_TABLET | Freq: Every day | ORAL | 2 refills | Status: DC
  Filled 2022-08-28: qty 60, 30d supply, fill #0
  Filled 2022-10-04: qty 60, 30d supply, fill #1
  Filled 2022-11-03: qty 60, 30d supply, fill #2

## 2022-09-02 ENCOUNTER — Other Ambulatory Visit (HOSPITAL_COMMUNITY): Payer: Self-pay

## 2022-09-09 ENCOUNTER — Other Ambulatory Visit (HOSPITAL_COMMUNITY): Payer: Self-pay

## 2022-09-10 ENCOUNTER — Other Ambulatory Visit (HOSPITAL_COMMUNITY): Payer: Self-pay

## 2022-09-25 ENCOUNTER — Other Ambulatory Visit (HOSPITAL_COMMUNITY): Payer: Self-pay

## 2022-09-26 ENCOUNTER — Other Ambulatory Visit (HOSPITAL_COMMUNITY): Payer: Self-pay

## 2022-09-26 DIAGNOSIS — K219 Gastro-esophageal reflux disease without esophagitis: Secondary | ICD-10-CM | POA: Diagnosis not present

## 2022-09-26 DIAGNOSIS — Z6831 Body mass index (BMI) 31.0-31.9, adult: Secondary | ICD-10-CM | POA: Diagnosis not present

## 2022-09-29 ENCOUNTER — Ambulatory Visit: Payer: 59 | Admitting: Nurse Practitioner

## 2022-10-04 ENCOUNTER — Other Ambulatory Visit (HOSPITAL_COMMUNITY): Payer: Self-pay

## 2022-10-06 ENCOUNTER — Other Ambulatory Visit (HOSPITAL_COMMUNITY): Payer: Self-pay

## 2022-10-14 ENCOUNTER — Other Ambulatory Visit (HOSPITAL_COMMUNITY): Payer: Self-pay

## 2022-10-14 ENCOUNTER — Other Ambulatory Visit: Payer: Self-pay | Admitting: Nurse Practitioner

## 2022-10-14 MED ORDER — LISDEXAMFETAMINE DIMESYLATE 30 MG PO CAPS
30.0000 mg | ORAL_CAPSULE | Freq: Every day | ORAL | 0 refills | Status: DC
  Filled 2022-10-14: qty 30, 30d supply, fill #0

## 2022-10-16 ENCOUNTER — Other Ambulatory Visit (HOSPITAL_COMMUNITY): Payer: Self-pay

## 2022-10-21 ENCOUNTER — Other Ambulatory Visit (HOSPITAL_COMMUNITY): Payer: Self-pay

## 2022-10-21 DIAGNOSIS — K219 Gastro-esophageal reflux disease without esophagitis: Secondary | ICD-10-CM | POA: Diagnosis not present

## 2022-10-21 DIAGNOSIS — R5382 Chronic fatigue, unspecified: Secondary | ICD-10-CM | POA: Diagnosis not present

## 2022-10-21 DIAGNOSIS — Z6831 Body mass index (BMI) 31.0-31.9, adult: Secondary | ICD-10-CM | POA: Diagnosis not present

## 2022-10-21 MED ORDER — WEGOVY 2.4 MG/0.75ML ~~LOC~~ SOAJ
2.4000 mg | SUBCUTANEOUS | 0 refills | Status: DC
  Filled 2022-10-21 – 2022-10-31 (×3): qty 3, 28d supply, fill #0

## 2022-10-22 ENCOUNTER — Other Ambulatory Visit (HOSPITAL_COMMUNITY): Payer: Self-pay

## 2022-10-23 ENCOUNTER — Encounter: Payer: Self-pay | Admitting: Nurse Practitioner

## 2022-10-23 ENCOUNTER — Ambulatory Visit: Payer: 59 | Admitting: Nurse Practitioner

## 2022-10-23 ENCOUNTER — Other Ambulatory Visit (HOSPITAL_COMMUNITY): Payer: Self-pay

## 2022-10-23 VITALS — BP 118/70 | HR 71 | Temp 96.8°F | Resp 14 | Ht 68.0 in | Wt 208.1 lb

## 2022-10-23 DIAGNOSIS — Z86711 Personal history of pulmonary embolism: Secondary | ICD-10-CM

## 2022-10-23 DIAGNOSIS — F321 Major depressive disorder, single episode, moderate: Secondary | ICD-10-CM

## 2022-10-23 DIAGNOSIS — F5101 Primary insomnia: Secondary | ICD-10-CM

## 2022-10-23 DIAGNOSIS — F909 Attention-deficit hyperactivity disorder, unspecified type: Secondary | ICD-10-CM | POA: Diagnosis not present

## 2022-10-23 MED ORDER — DULOXETINE HCL 30 MG PO CPEP
30.0000 mg | ORAL_CAPSULE | Freq: Every day | ORAL | 0 refills | Status: DC
  Filled 2022-10-23: qty 90, 90d supply, fill #0

## 2022-10-23 NOTE — Assessment & Plan Note (Signed)
PHQ-9 and GAD-7 administered in office.  Patient denies HI/SI/AVH.  Patient states she does have some thoughts that if she were here would be a big deal but no concrete plan or action of SI.  We will increase patient's Cymbalta from 60 mg to 90 mg daily.  She will follow-up in 6 weeks virtually or in person for recheck.

## 2022-10-23 NOTE — Assessment & Plan Note (Signed)
Patient currently maintained on Vyvanse 30 mg daily.  Patient tolerating medication well.  Does seem beneficial.  UDS appropriate and within 1 year.  Controlled substance agreement also signed and within 1 year.

## 2022-10-23 NOTE — Patient Instructions (Signed)
Nice to see you today Send me the latest [facility] labs Follow up with me in 6 week for a recheck on medication, this can be virtual

## 2022-10-23 NOTE — Assessment & Plan Note (Signed)
Patient still currently maintained on apixaban 5 mg twice daily.  Patient was referred to hematology/allergy.  Patient was unable to make the appointment.  Did give patient information to call and schedule an appointment since she was referred.

## 2022-10-23 NOTE — Progress Notes (Signed)
Established Patient Office Visit  Subjective   Patient ID: Sherri Miller, female    DOB: 23-May-1976  Age: 46 y.o. MRN: [id number]  Chief Complaint  Patient presents with   follow up on medication    Last UDS 06/04/22    HPI  ADHD: Patient currently maintained on Vyvanse 30 mg daily.  Last urine drug screen was performed on 06/04/2022 that was appropriate.  Patient does have a controlled substance agreement on file. States that it is helping her. States that she is seeing  Asthma: Patient currently maintained on as needed albuterol inhaler and Advair maintenance inhaler. States the last use was approx 3 weeks ago. States that she was sick  Insomnia: Patient currently mains on trazodone 150 mg nightly. States that she is not sleeping well. States that she is unsure if it is depression. Currently on duloxetine 60mg  and has done therapy in the past and states that she does not do well with therapy.  Pulmonary embolus: Patient was referred to hematology to see whether she would need to be on lifetime anticoagulation.  Patient was referred on 03/28/2022.  Per referral notes patient no-showed for the new hematology appointment    Review of Systems  Constitutional:  Negative for chills and fever.  Respiratory:  Negative for shortness of breath.   Cardiovascular:  Negative for chest pain.  Psychiatric/Behavioral:  Negative for hallucinations and suicidal ideas.       Objective:     BP 118/70   Pulse 71   Temp (!) 96.8 F (36 C)   Resp 14   Ht 5\' 8"  (1.727 m)   Wt 208 lb 2 oz (94.4 kg)   SpO2 98%   BMI 31.65 kg/m  BP Readings from Last 3 Encounters:  10/23/22 118/70  07/01/22 100/70  06/04/22 102/70   Wt Readings from Last 3 Encounters:  10/23/22 208 lb 2 oz (94.4 kg)  07/01/22 223 lb (101.2 kg)  06/04/22 226 lb 8 oz (102.7 kg)      Physical Exam Vitals and nursing note reviewed.  Constitutional:      Appearance: Normal appearance.  Cardiovascular:     Rate and  Rhythm: Normal rate and regular rhythm.     Heart sounds: Normal heart sounds.  Pulmonary:     Effort: Pulmonary effort is normal.     Breath sounds: Normal breath sounds.  Neurological:     Mental Status: She is alert.  Psychiatric:        Attention and Perception: Attention normal.        Mood and Affect: Mood is depressed.        Speech: Speech normal.        Behavior: Behavior normal.        Thought Content: Thought content does not include homicidal or suicidal ideation. Thought content does not include homicidal or suicidal plan.        Cognition and Memory: Cognition normal.        Judgment: Judgment normal.      No results found for any visits on 10/23/22.    The 10-year ASCVD risk score (Arnett DK, et al., 2019) is: 0.7%* (Cholesterol units were assumed)    Assessment & Plan:   Problem List Items Addressed This Visit       Other   Depression    PHQ-9 and GAD-7 administered in office.  Patient denies HI/SI/AVH.  Patient states she does have some thoughts that if she were here would be a  big deal but no concrete plan or action of SI.  We will increase patient's Cymbalta from 60 mg to 90 mg daily.  She will follow-up in 6 weeks virtually or in person for recheck.      Relevant Medications   DULoxetine (CYMBALTA) 30 MG capsule   Attention deficit hyperactivity disorder (ADHD)    Patient currently maintained on Vyvanse 30 mg daily.  Patient tolerating medication well.  Does seem beneficial.  UDS appropriate and within 1 year.  Controlled substance agreement also signed and within 1 year.      Insomnia    Patient currently maintained on trazodone 150 mg nightly.  States she still having some difficulty with sleep as of late.  Do think this is probably more related to depression.  We will titrate up Cymbalta from 60 mg to 90 mg daily.  She can continue trazodone 150 mg nightly.      Other Visit Diagnoses     History of pulmonary embolism    -  Primary        Return in about 6 weeks (around 12/04/2022) for MDD recheck .    Audria Nine, NP

## 2022-10-23 NOTE — Assessment & Plan Note (Signed)
Patient currently maintained on trazodone 150 mg nightly.  States she still having some difficulty with sleep as of late.  Do think this is probably more related to depression.  We will titrate up Cymbalta from 60 mg to 90 mg daily.  She can continue trazodone 150 mg nightly.

## 2022-10-29 ENCOUNTER — Other Ambulatory Visit (HOSPITAL_COMMUNITY): Payer: Self-pay

## 2022-10-31 ENCOUNTER — Other Ambulatory Visit (HOSPITAL_COMMUNITY): Payer: Self-pay

## 2022-11-04 ENCOUNTER — Other Ambulatory Visit (HOSPITAL_COMMUNITY): Payer: Self-pay

## 2022-11-05 ENCOUNTER — Other Ambulatory Visit (HOSPITAL_COMMUNITY): Payer: Self-pay

## 2022-11-27 ENCOUNTER — Other Ambulatory Visit: Payer: Self-pay | Admitting: Nurse Practitioner

## 2022-11-28 ENCOUNTER — Other Ambulatory Visit: Payer: Self-pay

## 2022-11-28 ENCOUNTER — Other Ambulatory Visit (HOSPITAL_COMMUNITY): Payer: Self-pay

## 2022-11-28 ENCOUNTER — Other Ambulatory Visit: Payer: Self-pay | Admitting: Nurse Practitioner

## 2022-11-28 DIAGNOSIS — F909 Attention-deficit hyperactivity disorder, unspecified type: Secondary | ICD-10-CM

## 2022-11-28 MED ORDER — LISDEXAMFETAMINE DIMESYLATE 30 MG PO CAPS
30.0000 mg | ORAL_CAPSULE | Freq: Every day | ORAL | 0 refills | Status: DC
  Filled 2022-11-28 – 2023-02-05 (×3): qty 30, 30d supply, fill #0

## 2022-11-28 MED ORDER — LISDEXAMFETAMINE DIMESYLATE 30 MG PO CAPS
30.0000 mg | ORAL_CAPSULE | Freq: Every day | ORAL | 0 refills | Status: DC
  Filled 2022-11-28 – 2022-12-30 (×2): qty 30, 30d supply, fill #0

## 2022-11-28 MED ORDER — LISDEXAMFETAMINE DIMESYLATE 30 MG PO CAPS
30.0000 mg | ORAL_CAPSULE | Freq: Every day | ORAL | 0 refills | Status: DC
  Filled 2022-11-28 (×2): qty 30, 30d supply, fill #0

## 2022-11-28 MED ORDER — WEGOVY 2.4 MG/0.75ML ~~LOC~~ SOAJ
2.4000 mg | SUBCUTANEOUS | 0 refills | Status: DC
  Filled 2022-11-28 – 2022-12-03 (×2): qty 3, 28d supply, fill #0

## 2022-12-02 ENCOUNTER — Other Ambulatory Visit (HOSPITAL_COMMUNITY): Payer: Self-pay

## 2022-12-03 ENCOUNTER — Other Ambulatory Visit (HOSPITAL_COMMUNITY): Payer: Self-pay

## 2022-12-06 ENCOUNTER — Other Ambulatory Visit (HOSPITAL_COMMUNITY): Payer: Self-pay

## 2022-12-11 ENCOUNTER — Other Ambulatory Visit (HOSPITAL_COMMUNITY): Payer: Self-pay

## 2022-12-12 ENCOUNTER — Other Ambulatory Visit (HOSPITAL_COMMUNITY): Payer: Self-pay

## 2022-12-19 ENCOUNTER — Emergency Department (HOSPITAL_BASED_OUTPATIENT_CLINIC_OR_DEPARTMENT_OTHER): Payer: Commercial Managed Care - PPO

## 2022-12-19 ENCOUNTER — Encounter (HOSPITAL_BASED_OUTPATIENT_CLINIC_OR_DEPARTMENT_OTHER): Payer: Self-pay | Admitting: Emergency Medicine

## 2022-12-19 ENCOUNTER — Emergency Department (HOSPITAL_BASED_OUTPATIENT_CLINIC_OR_DEPARTMENT_OTHER)
Admission: EM | Admit: 2022-12-19 | Discharge: 2022-12-19 | Disposition: A | Discharge status: Home / Self Care | Payer: Commercial Managed Care - PPO | Attending: Emergency Medicine | Admitting: Emergency Medicine

## 2022-12-19 ENCOUNTER — Other Ambulatory Visit: Payer: Self-pay

## 2022-12-19 DIAGNOSIS — R0609 Other forms of dyspnea: Secondary | ICD-10-CM | POA: Diagnosis not present

## 2022-12-19 DIAGNOSIS — Z7901 Long term (current) use of anticoagulants: Secondary | ICD-10-CM | POA: Insufficient documentation

## 2022-12-19 DIAGNOSIS — Z91040 Latex allergy status: Secondary | ICD-10-CM | POA: Insufficient documentation

## 2022-12-19 DIAGNOSIS — R079 Chest pain, unspecified: Secondary | ICD-10-CM | POA: Insufficient documentation

## 2022-12-19 DIAGNOSIS — Z794 Long term (current) use of insulin: Secondary | ICD-10-CM | POA: Insufficient documentation

## 2022-12-19 DIAGNOSIS — Z86711 Personal history of pulmonary embolism: Secondary | ICD-10-CM | POA: Diagnosis not present

## 2022-12-19 DIAGNOSIS — R000 Tachycardia, unspecified: Secondary | ICD-10-CM | POA: Diagnosis not present

## 2022-12-19 DIAGNOSIS — Z20822 Contact with and (suspected) exposure to covid-19: Secondary | ICD-10-CM | POA: Insufficient documentation

## 2022-12-19 DIAGNOSIS — Z0389 Encounter for observation for other suspected diseases and conditions ruled out: Secondary | ICD-10-CM | POA: Diagnosis not present

## 2022-12-19 DIAGNOSIS — R0602 Shortness of breath: Secondary | ICD-10-CM | POA: Diagnosis not present

## 2022-12-19 LAB — RESP PANEL BY RT-PCR (RSV, FLU A&B, COVID)  RVPGX2
Influenza A by PCR: NEGATIVE
Influenza B by PCR: NEGATIVE
Resp Syncytial Virus by PCR: NEGATIVE
SARS Coronavirus 2 by RT PCR: NEGATIVE

## 2022-12-19 LAB — CBC WITH DIFFERENTIAL/PLATELET
Abs Immature Granulocytes: 0 10*3/uL (ref 0.00–0.07)
Basophils Absolute: 0 10*3/uL (ref 0.0–0.1)
Basophils Relative: 1 %
Eosinophils Absolute: 0.1 10*3/uL (ref 0.0–0.5)
Eosinophils Relative: 3 %
HCT: 43 % (ref 36.0–46.0)
Hemoglobin: 14.3 g/dL (ref 12.0–15.0)
Immature Granulocytes: 0 %
Lymphocytes Relative: 43 %
Lymphs Abs: 2.3 10*3/uL (ref 0.7–4.0)
MCH: 28.1 pg (ref 26.0–34.0)
MCHC: 33.3 g/dL (ref 30.0–36.0)
MCV: 84.5 fL (ref 80.0–100.0)
Monocytes Absolute: 0.5 10*3/uL (ref 0.1–1.0)
Monocytes Relative: 10 %
Neutro Abs: 2.3 10*3/uL (ref 1.7–7.7)
Neutrophils Relative %: 43 %
Platelets: 243 10*3/uL (ref 150–400)
RBC: 5.09 MIL/uL (ref 3.87–5.11)
RDW: 12.7 % (ref 11.5–15.5)
WBC: 5.3 10*3/uL (ref 4.0–10.5)
nRBC: 0 % (ref 0.0–0.2)

## 2022-12-19 LAB — TROPONIN I (HIGH SENSITIVITY): Troponin I (High Sensitivity): <2 ng/L (ref <18)

## 2022-12-19 LAB — PROTIME-INR
INR: 1.1 (ref 0.8–1.2)
Prothrombin Time: 13.7 seconds (ref 11.4–15.2)

## 2022-12-19 LAB — BASIC METABOLIC PANEL
Anion gap: 11 (ref 5–15)
BUN: 16 mg/dL (ref 6–20)
CO2: 24 mmol/L (ref 22–32)
Calcium: 10.3 mg/dL (ref 8.9–10.3)
Chloride: 102 mmol/L (ref 98–111)
Creatinine, Ser: 1.07 mg/dL — ABNORMAL HIGH (ref 0.44–1.00)
GFR, Estimated: >60 mL/min (ref >60)
Glucose, Bld: 87 mg/dL (ref 70–99)
Potassium: 3.5 mmol/L (ref 3.5–5.1)
Sodium: 137 mmol/L (ref 135–145)

## 2022-12-19 MED ORDER — IOHEXOL 350 MG/ML SOLN
100.0000 mL | Freq: Once | INTRAVENOUS | Status: AC | PRN
  Administered 2022-12-19: 80 mL via INTRAVENOUS

## 2022-12-19 NOTE — ED Provider Notes (Signed)
[facility] Provider Note   CSN: [id number] Arrival date & time: 12/19/22  1216     History  Chief Complaint  Patient presents with   Shortness of Breath    Sherri Miller is a 47 y.o. female.  Prior history of PE.  She unfortunately did not have her Eliquis available while she went on a trip this past week to [place].  10 hours on the road.  For the last 4 days she has noticed some discomfort in her left calf and tightness in her chest.  When she exerts herself she felt short of breath and her heart rate was elevated into the 130s.  She had been taking aspirin during the time that she did not have access to her Eliquis.  She restarted her Eliquis yesterday but continued to feel the symptoms today.  No fevers or chills.  Non-smoker  The history is provided by the patient.  Shortness of Breath Severity:  Moderate Onset quality:  Gradual Duration:  4 days Timing:  Intermittent Progression:  Unchanged Chronicity:  Recurrent Context: activity   Relieved by:  Rest Worsened by:  Activity Ineffective treatments: aspirin. Associated symptoms: chest pain   Associated symptoms: no abdominal pain, no hemoptysis, no sputum production and no vomiting   Risk factors: hx of PE/DVT   Risk factors: no tobacco use        Home Medications Prior to Admission medications   Medication Sig Start Date End Date Taking? Authorizing Provider  albuterol (VENTOLIN HFA) 108 (90 Base) MCG/ACT inhaler Inhale 2 puffs into the lungs every 6 (six) hours as needed for wheezing or shortness of breath. 07/01/22   Michela Pitcher, NP  apixaban (ELIQUIS) 5 MG TABS tablet TAKE 1 TABLET BY MOUTH TWICE DAILY 07/14/22 07/14/23  Michela Pitcher, NP  Calcium Carbonate-Vit D-Min (CALCIUM 1200 PO) Take by mouth. Not sure of the dose    [provider]  DULoxetine (CYMBALTA) 30 MG capsule Take 1 capsule (30 mg total) by mouth daily. Take with cymbalta 60mg  tablet 10/23/22   Michela Pitcher,  NP  DULoxetine (CYMBALTA) 60 MG capsule Take 1 capsule by mouth 2 times daily. 08/05/22   Michela Pitcher, NP  famotidine (PEPCID) 20 MG tablet Take 1 tablet (20 mg total) by mouth 2 (two) times daily. 07/01/22   Waunita Schooner, MD  fluticasone-salmeterol (ADVAIR DISKUS) 250-50 MCG/ACT AEPB Inhale 1 puff into the lungs in the morning and at bedtime. 07/01/22   Waunita Schooner, MD  HYDROcodone bit-homatropine (HYCODAN) 5-1.5 MG/5ML syrup Take 5 mLs by mouth every 8 (eight) hours as needed for cough. 07/01/22   Waunita Schooner, MD  liothyronine (CYTOMEL) 5 MCG tablet Take 2 tablets (10 mcg total) by mouth daily. 08/28/22     lisdexamfetamine (VYVANSE) 30 MG capsule Take 1 capsule (30 mg total) by mouth daily. 11/28/22   Michela Pitcher, NP  lisdexamfetamine (VYVANSE) 30 MG capsule Take 1 capsule (30 mg total) by mouth daily. 12/26/22   Michela Pitcher, NP  lisdexamfetamine (VYVANSE) 30 MG capsule Take 1 capsule (30 mg total) by mouth daily. 11/28/22   Michela Pitcher, NP  montelukast (SINGULAIR) 10 MG tablet TAKE 1 TABLET BY MOUTH AT BEDTIME 05/07/22 05/07/23  Michela Pitcher, NP  ondansetron (ZOFRAN-ODT) 4 MG disintegrating tablet Dissolve 1 tablet on the tongue every 8 hours as needed. 05/08/22     Prasterone, DHEA, (DHEA 50 PO) Take by mouth.    [provider]  rizatriptan (MAXALT) 10 MG tablet Take 1 tablet by mouth as needed. May repeat once as directed 11/13/21     Semaglutide-Weight Management (WEGOVY) 2.4 MG/0.75ML SOAJ Inject 2.4 mg into the skin once a week. 11/28/22     traZODone (DESYREL) 150 MG tablet Take 1 tablet (150 mg total) by mouth at bedtime. 07/14/22   Michela Pitcher, NP  VITAMIN D PO Take by mouth. With K-5000 u ?    [provider]      Allergies    Codeine, Penicillins, and Latex    Review of Systems   Review of Systems  Constitutional:  Positive for fatigue.  Respiratory:  Positive for shortness of breath. Negative for hemoptysis and sputum production.   Cardiovascular:   Positive for chest pain.  Gastrointestinal:  Negative for abdominal pain and vomiting.    Physical Exam Updated Vital Signs BP (!) 132/102 (BP Location: Right Arm)   Pulse (!) 103   Temp 98.6 F (37 C) (Oral)   Resp 18   SpO2 100%  Physical Exam Vitals and nursing note reviewed.  Constitutional:      General: She is not in acute distress.    Appearance: She is well-developed.  HENT:     Head: Normocephalic and atraumatic.  Eyes:     Conjunctiva/sclera: Conjunctivae normal.  Cardiovascular:     Rate and Rhythm: Regular rhythm. Tachycardia present.     Heart sounds: No murmur heard. Pulmonary:     Effort: Pulmonary effort is normal. No respiratory distress.     Breath sounds: Normal breath sounds.  Abdominal:     Palpations: Abdomen is soft.     Tenderness: There is no abdominal tenderness.  Musculoskeletal:        General: No swelling.     Cervical back: Neck supple.     Right lower leg: No edema.     Left lower leg: No edema.  Skin:    General: Skin is warm and dry.     Capillary Refill: Capillary refill takes less than 2 seconds.  Neurological:     General: No focal deficit present.     Mental Status: She is alert.     ED Results / Procedures / Treatments   Labs (all labs ordered are listed, but only abnormal results are displayed) Labs Reviewed  BASIC METABOLIC PANEL - Abnormal; Notable for the following components:      Result Value   Creatinine, Ser 1.07 (*)    All other components within normal limits  RESP PANEL BY RT-PCR (RSV, FLU A&B, COVID)  RVPGX2  CBC WITH DIFFERENTIAL/PLATELET  PROTIME-INR  TROPONIN I (HIGH SENSITIVITY)  TROPONIN I (HIGH SENSITIVITY)    EKG EKG Interpretation  Date/Time:  Friday December 19 2022 12:23:10 EST Ventricular Rate:  88 PR Interval:  138 QRS Duration: 76 QT Interval:  340 QTC Calculation: 411 R Axis:   33 Text Interpretation: Normal sinus rhythm Nonspecific ST abnormality Abnormal ECG When compared with ECG of  03-Oct-2021 05:06, new ST depressions ant and inf Confirmed by Aletta Edouard [phone]) on 12/19/2022 12:25:55 PM  Radiology CT Angio Chest PE W/Cm &/Or Wo Cm  Result Date: 12/19/2022 CLINICAL DATA:  Pulmonary embolism (PE) suspected, high prob EXAM: CT ANGIOGRAPHY CHEST WITH CONTRAST TECHNIQUE: Multidetector CT imaging of the chest was performed using the standard protocol during bolus administration of intravenous contrast. Multiplanar CT image reconstructions and MIPs were obtained to evaluate the vascular anatomy. RADIATION DOSE REDUCTION: This exam was performed according to  the departmental dose-optimization program which includes automated exposure control, adjustment of the mA and/or kV according to patient size and/or use of iterative reconstruction technique. CONTRAST:  74mL OMNIPAQUE IOHEXOL 350 MG/ML SOLN COMPARISON:  01/10/2020 FINDINGS: Cardiovascular: Satisfactory opacification of the pulmonary arteries to the segmental level. No evidence of pulmonary embolism. Thoracic aorta is normal in course and caliber. Normal heart size. No pericardial effusion. Mediastinum/Nodes: No enlarged mediastinal, hilar, or axillary lymph nodes. Thyroid gland, trachea, and esophagus demonstrate no significant findings. Lungs/Pleura: Lungs are clear. No pleural effusion or pneumothorax. Upper Abdomen: Partially visualized possible asymmetric wall thickening involving the proximal descending colon (series 4, image 152). Otherwise negative. Musculoskeletal: No chest wall abnormality. No acute or significant osseous findings. Review of the MIP images confirms the above findings. IMPRESSION: 1. No evidence of pulmonary embolism or other acute intrathoracic process. 2. Partially visualized possible asymmetric wall thickening involving the proximal descending colon. While this may reflect a combination of fluid intermixed with stool and peristalsis, underlying colonic neoplasm cannot be excluded and warrants further  evaluation with nonemergent colonoscopy, if not recently performed. Electronically Signed   By: Duanne Guess D.O.   On: 12/19/2022 13:53    Procedures Procedures    Medications Ordered in ED Medications  iohexol (OMNIPAQUE) 350 MG/ML injection 100 mL (80 mLs Intravenous Contrast Given 12/19/22 1329)    ED Course/ Medical Decision Making/ A&P Clinical Course as of 12/19/22 1651  Fri Dec 19, 2022  1414 Reviewed results of CT with patient.  I offered her a duplex of her lower extremity which she declines.  She is going to follow-up with her cardiologist.  Return instructions discussed I did review the findings of the CAT scan regarding her abdomen which she has no symptoms of and she will follow this up [MB]    Clinical Course User Index [MB] Terrilee Files, MD                           Medical Decision Making Amount and/or Complexity of Data Reviewed Labs: ordered. Radiology: ordered.  Risk Prescription drug management.   This patient complains of chest tightness shortness of breath dyspnea on exertion; this involves an extensive number of treatment Options and is a complaint that carries with it a high risk of complications and morbidity. The differential includes PE, anemia, infection, COVID, flu, pneumothorax  I ordered, reviewed and interpreted labs, which included CBC with normal white count normal hemoglobin chemistries normal COVID and flu negative troponin flat I ordered imaging studies which included CT angio chest and I independently    visualized and interpreted imaging which showed no acute chest findings Previous records obtained and reviewed in epic including prior PCP notes Cardiac monitoring reviewed, normal sinus rhythm Social determinants considered, no significant barriers Critical Interventions: None  After the interventions stated above, I reevaluated the patient and found patient to be oxygenating well on room air in no distress Admission and  further testing considered, she was offered a duplex which she declines.  She will follow-up with her cardiology and other treatment team.  Recommended continuing her anticoagulation.  Return instructions discussed         Final Clinical Impression(s) / ED Diagnoses Final diagnoses:  Dyspnea on exertion    Rx / DC Orders ED Discharge Orders     None         Terrilee Files, MD 12/19/22 [phone]

## 2022-12-19 NOTE — ED Triage Notes (Addendum)
Pt presents to ED POV. Pt c/o chest discomfort, tachycardia, and SOB since Wednesday. L leg cramping that started wed after driving to [facility]. Takes eliquis but missed a couple doses this week. Hx of PE

## 2022-12-19 NOTE — Discharge Instructions (Signed)
You were seen in the emergency department for evaluation of shortness of breath.  Your lab work was fairly unremarkable including COVID and flu testing.  A CT angio was done which did not show any evidence of blood clot.  There was an incidental finding in the abdomen that will need follow-up.  Please continue your blood thinner and regular medications.  Follow-up with your primary care doctor and cardiologist.  Included below is report of the CAT scan that you will need follow-up on  IMPRESSION:  1. No evidence of pulmonary embolism or other acute intrathoracic  process.  2. Partially visualized possible asymmetric wall thickening  involving the proximal descending colon. While this may reflect a  combination of fluid intermixed with stool and peristalsis,  underlying colonic neoplasm cannot be excluded and warrants further  evaluation with nonemergent colonoscopy, if not recently performed.

## 2022-12-19 NOTE — ED Notes (Signed)
Patient verbalizes understanding of discharge instructions. Opportunity for questioning and answers were provided. Patient discharged from ED.

## 2022-12-19 NOTE — ED Notes (Signed)
Patient transported to CT 

## 2022-12-24 ENCOUNTER — Inpatient Hospital Stay (HOSPITAL_COMMUNITY)
Admission: RE | Admit: 2022-12-24 | Discharge: 2022-12-24 | Disposition: A | Discharge status: Home / Self Care | Payer: Commercial Managed Care - PPO | Source: Ambulatory Visit | Attending: Cardiology | Admitting: Cardiology

## 2022-12-24 ENCOUNTER — Encounter (HOSPITAL_COMMUNITY): Payer: Self-pay

## 2022-12-24 ENCOUNTER — Ambulatory Visit (HOSPITAL_COMMUNITY): Admission: RE | Admit: 2022-12-24 | Payer: Commercial Managed Care - PPO | Source: Ambulatory Visit

## 2022-12-24 ENCOUNTER — Other Ambulatory Visit (HOSPITAL_COMMUNITY): Payer: Self-pay | Admitting: Cardiology

## 2022-12-24 DIAGNOSIS — I493 Ventricular premature depolarization: Secondary | ICD-10-CM

## 2022-12-29 ENCOUNTER — Other Ambulatory Visit (HOSPITAL_COMMUNITY): Payer: Self-pay

## 2022-12-29 ENCOUNTER — Other Ambulatory Visit: Payer: Self-pay

## 2022-12-30 ENCOUNTER — Other Ambulatory Visit: Payer: Self-pay

## 2022-12-30 ENCOUNTER — Other Ambulatory Visit (HOSPITAL_COMMUNITY): Payer: Self-pay

## 2022-12-31 ENCOUNTER — Other Ambulatory Visit (HOSPITAL_COMMUNITY): Payer: Self-pay

## 2022-12-31 DIAGNOSIS — N951 Menopausal and female climacteric states: Secondary | ICD-10-CM | POA: Diagnosis not present

## 2022-12-31 DIAGNOSIS — E785 Hyperlipidemia, unspecified: Secondary | ICD-10-CM | POA: Diagnosis not present

## 2022-12-31 DIAGNOSIS — Z6830 Body mass index (BMI) 30.0-30.9, adult: Secondary | ICD-10-CM | POA: Diagnosis not present

## 2022-12-31 DIAGNOSIS — M25519 Pain in unspecified shoulder: Secondary | ICD-10-CM | POA: Diagnosis not present

## 2022-12-31 DIAGNOSIS — E038 Other specified hypothyroidism: Secondary | ICD-10-CM | POA: Diagnosis not present

## 2022-12-31 MED ORDER — WEGOVY 2.4 MG/0.75ML ~~LOC~~ SOAJ
2.4000 mg | SUBCUTANEOUS | 0 refills | Status: DC
  Filled 2022-12-31: qty 3, 28d supply, fill #0

## 2022-12-31 MED ORDER — LIOTHYRONINE SODIUM 5 MCG PO TABS
10.0000 ug | ORAL_TABLET | Freq: Every day | ORAL | 2 refills | Status: DC
  Filled 2022-12-31: qty 60, 30d supply, fill #0
  Filled 2023-02-05: qty 60, 30d supply, fill #1
  Filled 2023-03-23: qty 60, 30d supply, fill #2

## 2023-01-02 ENCOUNTER — Telehealth: Payer: Commercial Managed Care - PPO | Admitting: Family Medicine

## 2023-01-02 DIAGNOSIS — J208 Acute bronchitis due to other specified organisms: Secondary | ICD-10-CM | POA: Diagnosis not present

## 2023-01-02 MED ORDER — PREDNISONE 20 MG PO TABS: 20.0000 mg | ORAL_TABLET | Freq: Two times a day (BID) | ORAL | 0 refills | Status: AC

## 2023-01-02 MED ORDER — BENZONATATE 200 MG PO CAPS: 200.0000 mg | ORAL_CAPSULE | Freq: Two times a day (BID) | ORAL | 0 refills | Status: DC | PRN

## 2023-01-02 NOTE — Progress Notes (Signed)
We are sorry that you are not feeling well.  Here is how we plan to help!  Based on your presentation I believe you most likely have A cough due to a virus.  This is called viral bronchitis and is best treated by rest, plenty of fluids and control of the cough.  You may use Ibuprofen or Tylenol as directed to help your symptoms.     In addition you may use A prescription cough medication called Tessalon Perles 100mg. You may take 1-2 capsules every 8 hours as needed for your cough.  Prednisone 20 mg po bid for 5 days.   From your responses in the eVisit questionnaire you describe inflammation in the upper respiratory tract which is causing a significant cough.  This is commonly called Bronchitis and has four common causes:   Allergies Viral Infections Acid Reflux Bacterial Infection Allergies, viruses and acid reflux are treated by controlling symptoms or eliminating the cause. An example might be a cough caused by taking certain blood pressure medications. You stop the cough by changing the medication. Another example might be a cough caused by acid reflux. Controlling the reflux helps control the cough.  USE OF BRONCHODILATOR ("RESCUE") INHALERS: There is a risk from using your bronchodilator too frequently.  The risk is that over-reliance on a medication which only relaxes the muscles surrounding the breathing tubes can reduce the effectiveness of medications prescribed to reduce swelling and congestion of the tubes themselves.  Although you feel brief relief from the bronchodilator inhaler, your asthma may actually be worsening with the tubes becoming more swollen and filled with mucus.  This can delay other crucial treatments, such as oral steroid medications. If you need to use a bronchodilator inhaler daily, several times per day, you should discuss this with your provider.  There are probably better treatments that could be used to keep your asthma under control.     HOME CARE Only take  medications as instructed by your medical team. Complete the entire course of an antibiotic. Drink plenty of fluids and get plenty of rest. Avoid close contacts especially the very young and the elderly Cover your mouth if you cough or cough into your sleeve. Always remember to wash your hands A steam or ultrasonic humidifier can help congestion.   GET HELP RIGHT AWAY IF: You develop worsening fever. You become short of breath You cough up blood. Your symptoms persist after you have completed your treatment plan MAKE SURE YOU  Understand these instructions. Will watch your condition. Will get help right away if you are not doing well or get worse.    Thank you for choosing an e-visit.  Your e-visit answers were reviewed by a board certified advanced clinical practitioner to complete your personal care plan. Depending upon the condition, your plan could have included both over the counter or prescription medications.  Please review your pharmacy choice. Make sure the pharmacy is open so you can pick up prescription now. If there is a problem, you may contact your provider through MyChart messaging and have the prescription routed to another pharmacy.  Your safety is important to us. If you have drug allergies check your prescription carefully.   For the next 24 hours you can use MyChart to ask questions about today's visit, request a non-urgent call back, or ask for a work or school excuse. You will get an email in the next two days asking about your experience. I hope that your e-visit has been valuable and   will speed your recovery.    have provided 5 minutes of non face to face time during this encounter for chart review and documentation.   

## 2023-01-03 ENCOUNTER — Telehealth: Payer: Commercial Managed Care - PPO | Admitting: Physician Assistant

## 2023-01-03 ENCOUNTER — Encounter: Payer: Self-pay | Admitting: Physician Assistant

## 2023-01-03 ENCOUNTER — Other Ambulatory Visit (HOSPITAL_COMMUNITY): Payer: Self-pay

## 2023-01-03 DIAGNOSIS — R6889 Other general symptoms and signs: Secondary | ICD-10-CM

## 2023-01-03 MED ORDER — OSELTAMIVIR PHOSPHATE 75 MG PO CAPS: 75.0000 mg | ORAL_CAPSULE | Freq: Two times a day (BID) | ORAL | 0 refills | Status: AC

## 2023-01-03 NOTE — Progress Notes (Signed)
E visit for Flu like symptoms   We are sorry that you are not feeling well.  Here is how we plan to help! Based on what you have shared with me it looks like you may have a respiratory virus that may be influenza.  Influenza or "the flu" is   an infection caused by a respiratory virus. The flu virus is highly contagious and persons who did not receive their yearly flu vaccination may "catch" the flu from close contact.  We have anti-viral medications to treat the viruses that cause this infection. They are not a "cure" and only shorten the course of the infection. These prescriptions are most effective when they are given within the first 2 days of "flu" symptoms. Antiviral medication are indicated if you have a high risk of complications from the flu. You should  also consider an antiviral medication if you are in close contact with someone who is at risk. These medications can help patients avoid complications from the flu  but have side effects that you should know. Possible side effects from Tamiflu or oseltamivir include nausea, vomiting, diarrhea, dizziness, headaches, eye redness, sleep problems or other respiratory symptoms. You should not take Tamiflu if you have an allergy to oseltamivir or any to the ingredients in Tamiflu.  Based upon your symptoms and potential risk factors I have prescribed Oseltamivir (Tamiflu).  It has been sent to your designated pharmacy.  You will take one 75 mg capsule orally twice a day for the next 5 days. and I recommend that you follow the flu symptoms recommendation that I have listed below.  ANYONE WHO HAS FLU SYMPTOMS SHOULD: Stay home. The flu is highly contagious and going out or to work exposes others! Be sure to drink plenty of fluids. Water is fine as well as fruit juices, sodas and electrolyte beverages. You may want to stay away from caffeine or alcohol. If you are nauseated, try taking small sips of liquids. How do you know if you are getting enough  fluid? Your urine should be a pale yellow or almost colorless. Get rest. Taking a steamy shower or using a humidifier may help nasal congestion and ease sore throat pain. Using a saline nasal spray works much the same way. Cough drops, hard candies and sore throat lozenges may ease your cough. Line up a caregiver. Have someone check on you regularly.   GET HELP RIGHT AWAY IF: You cannot keep down liquids or your medications. You become short of breath Your fell like you are going to pass out or loose consciousness. Your symptoms persist after you have completed your treatment plan MAKE SURE YOU  Understand these instructions. Will watch your condition. Will get help right away if you are not doing well or get worse.  Your e-visit answers were reviewed by a board certified advanced clinical practitioner to complete your personal care plan.  Depending on the condition, your plan could have included both over the counter or prescription medications.  If there is a problem please reply  once you have received a response from your provider.  Your safety is important to us.  If you have drug allergies check your prescription carefully.    You can use MyChart to ask questions about today's visit, request a non-urgent call back, or ask for a work or school excuse for 24 hours related to this e-Visit. If it has been greater than 24 hours you will need to follow up with your provider, or enter a   new e-Visit to address those concerns.  You will get an e-mail in the next two days asking about your experience.  I hope that your e-visit has been valuable and will speed your recovery. Thank you for using e-visits.  I have spent 5 minutes in review of e-visit questionnaire, review and updating patient chart, medical decision making and response to patient.   Loraine Grip Mayers, PA-C

## 2023-01-05 ENCOUNTER — Other Ambulatory Visit (HOSPITAL_COMMUNITY): Payer: Self-pay

## 2023-01-07 ENCOUNTER — Other Ambulatory Visit: Payer: Self-pay | Admitting: Nurse Practitioner

## 2023-01-07 ENCOUNTER — Other Ambulatory Visit (HOSPITAL_COMMUNITY): Payer: Self-pay

## 2023-01-08 ENCOUNTER — Other Ambulatory Visit (HOSPITAL_COMMUNITY): Payer: Self-pay

## 2023-01-08 ENCOUNTER — Ambulatory Visit: Payer: Commercial Managed Care - PPO | Admitting: Nurse Practitioner

## 2023-01-08 MED ORDER — RIZATRIPTAN BENZOATE 10 MG PO TABS
10.0000 mg | ORAL_TABLET | ORAL | 1 refills | Status: DC | PRN
  Filled 2023-01-08 – 2023-02-05 (×2): qty 20, 60d supply, fill #0

## 2023-01-21 ENCOUNTER — Other Ambulatory Visit (HOSPITAL_COMMUNITY): Payer: Self-pay

## 2023-01-27 ENCOUNTER — Telehealth (HOSPITAL_COMMUNITY): Payer: Self-pay | Admitting: Surgery

## 2023-01-27 ENCOUNTER — Other Ambulatory Visit (HOSPITAL_COMMUNITY): Payer: Self-pay

## 2023-01-27 NOTE — Telephone Encounter (Signed)
I called patient regarding her Zio monitor return.  I received email from company saying that her device and results were delayed.  She confirms that she returned the device weeks ago.  I will follow up with the company and call her back if repeat needed.

## 2023-02-05 ENCOUNTER — Other Ambulatory Visit (HOSPITAL_COMMUNITY): Payer: Self-pay

## 2023-02-05 ENCOUNTER — Other Ambulatory Visit: Payer: Self-pay | Admitting: Nurse Practitioner

## 2023-02-05 DIAGNOSIS — E038 Other specified hypothyroidism: Secondary | ICD-10-CM | POA: Diagnosis not present

## 2023-02-05 DIAGNOSIS — F321 Major depressive disorder, single episode, moderate: Secondary | ICD-10-CM

## 2023-02-05 DIAGNOSIS — Z6830 Body mass index (BMI) 30.0-30.9, adult: Secondary | ICD-10-CM | POA: Diagnosis not present

## 2023-02-05 DIAGNOSIS — K219 Gastro-esophageal reflux disease without esophagitis: Secondary | ICD-10-CM | POA: Diagnosis not present

## 2023-02-05 MED ORDER — WEGOVY 2.4 MG/0.75ML ~~LOC~~ SOAJ
2.4000 mg | SUBCUTANEOUS | 0 refills | Status: DC
  Filled 2023-02-05: qty 3, 28d supply, fill #0
  Filled 2023-03-02: qty 3, 28d supply, fill #1
  Filled 2023-03-23: qty 3, 28d supply, fill #2

## 2023-02-06 ENCOUNTER — Other Ambulatory Visit (HOSPITAL_COMMUNITY): Payer: Self-pay

## 2023-02-06 MED ORDER — DULOXETINE HCL 30 MG PO CPEP
30.0000 mg | ORAL_CAPSULE | Freq: Every day | ORAL | 0 refills | Status: DC
  Filled 2023-02-06: qty 90, 90d supply, fill #0

## 2023-02-10 ENCOUNTER — Other Ambulatory Visit (HOSPITAL_COMMUNITY): Payer: Self-pay

## 2023-03-01 ENCOUNTER — Other Ambulatory Visit: Payer: Self-pay | Admitting: Nurse Practitioner

## 2023-03-01 DIAGNOSIS — F909 Attention-deficit hyperactivity disorder, unspecified type: Secondary | ICD-10-CM

## 2023-03-02 ENCOUNTER — Other Ambulatory Visit (HOSPITAL_COMMUNITY): Payer: Self-pay

## 2023-03-02 ENCOUNTER — Other Ambulatory Visit: Payer: Self-pay | Admitting: Nurse Practitioner

## 2023-03-02 DIAGNOSIS — F909 Attention-deficit hyperactivity disorder, unspecified type: Secondary | ICD-10-CM

## 2023-03-02 MED ORDER — LISDEXAMFETAMINE DIMESYLATE 30 MG PO CAPS
30.0000 mg | ORAL_CAPSULE | Freq: Every day | ORAL | 0 refills | Status: DC
  Filled 2023-03-02 – 2023-05-14 (×2): qty 30, 30d supply, fill #0

## 2023-03-02 MED ORDER — LISDEXAMFETAMINE DIMESYLATE 30 MG PO CAPS
30.0000 mg | ORAL_CAPSULE | Freq: Every day | ORAL | 0 refills | Status: DC
  Filled 2023-03-02 – 2023-03-23 (×2): qty 30, 30d supply, fill #0

## 2023-03-02 MED ORDER — LISDEXAMFETAMINE DIMESYLATE 30 MG PO CAPS
30.0000 mg | ORAL_CAPSULE | Freq: Every day | ORAL | 0 refills | Status: DC
  Filled 2023-03-02 – 2023-06-10 (×2): qty 30, 30d supply, fill #0

## 2023-03-06 ENCOUNTER — Other Ambulatory Visit (HOSPITAL_COMMUNITY): Payer: Self-pay

## 2023-03-23 ENCOUNTER — Other Ambulatory Visit: Payer: Self-pay | Admitting: Nurse Practitioner

## 2023-03-23 ENCOUNTER — Other Ambulatory Visit: Payer: Self-pay

## 2023-03-23 DIAGNOSIS — F321 Major depressive disorder, single episode, moderate: Secondary | ICD-10-CM

## 2023-03-23 DIAGNOSIS — G4700 Insomnia, unspecified: Secondary | ICD-10-CM

## 2023-03-24 ENCOUNTER — Other Ambulatory Visit (HOSPITAL_COMMUNITY): Payer: Self-pay

## 2023-03-24 ENCOUNTER — Other Ambulatory Visit: Payer: Self-pay

## 2023-03-24 ENCOUNTER — Telehealth: Payer: Self-pay | Admitting: Nurse Practitioner

## 2023-03-24 NOTE — Telephone Encounter (Signed)
Called and left detailed message on VM about duloxetine. Need clarification if she is taking the  BID and a  daily. If she is she can d/c the  one If she is taking 1  and 1  that is ok and she can continue

## 2023-03-25 NOTE — Telephone Encounter (Signed)
Left a message on voicemail for patient to call the office back. 

## 2023-03-26 ENCOUNTER — Other Ambulatory Visit: Payer: Self-pay

## 2023-03-26 ENCOUNTER — Other Ambulatory Visit (HOSPITAL_COMMUNITY): Payer: Self-pay

## 2023-03-27 ENCOUNTER — Encounter: Payer: Self-pay | Admitting: Nurse Practitioner

## 2023-03-27 ENCOUNTER — Telehealth: Payer: Self-pay | Admitting: Nurse Practitioner

## 2023-03-27 NOTE — Telephone Encounter (Signed)
Called to clarify the duloxetine prescription. Left voicemail to return call to office. Also sent a my chart message to the patient

## 2023-03-30 ENCOUNTER — Other Ambulatory Visit: Payer: Self-pay | Admitting: Nurse Practitioner

## 2023-03-30 ENCOUNTER — Other Ambulatory Visit (HOSPITAL_COMMUNITY): Payer: Self-pay

## 2023-03-30 DIAGNOSIS — G4700 Insomnia, unspecified: Secondary | ICD-10-CM

## 2023-03-30 DIAGNOSIS — J309 Allergic rhinitis, unspecified: Secondary | ICD-10-CM

## 2023-03-30 DIAGNOSIS — R053 Chronic cough: Secondary | ICD-10-CM

## 2023-03-30 MED ORDER — TRAZODONE HCL 150 MG PO TABS
150.0000 mg | ORAL_TABLET | Freq: Every day | ORAL | 1 refills | Status: DC
  Filled 2023-03-30 – 2023-04-16 (×3): qty 90, 90d supply, fill #0
  Filled 2023-06-19 – 2023-07-08 (×2): qty 90, 90d supply, fill #1

## 2023-03-30 MED ORDER — FAMOTIDINE 20 MG PO TABS
20.0000 mg | ORAL_TABLET | Freq: Two times a day (BID) | ORAL | 0 refills | Status: DC
  Filled 2023-03-30: qty 180, 90d supply, fill #0

## 2023-03-30 NOTE — Telephone Encounter (Signed)
Unable to reach patient. Left voicemail to return call to our office.   Also sending MyChart message to patient

## 2023-03-31 ENCOUNTER — Other Ambulatory Visit (HOSPITAL_COMMUNITY): Payer: Self-pay

## 2023-03-31 NOTE — Telephone Encounter (Signed)
See other mychart message routed to [facility] .

## 2023-04-01 ENCOUNTER — Encounter: Payer: Self-pay | Admitting: Nurse Practitioner

## 2023-04-01 MED ORDER — DULOXETINE HCL 60 MG PO CPEP: 60.0000 mg | ORAL_CAPSULE | Freq: Every day | ORAL | 0 refills | Status: DC

## 2023-04-01 NOTE — Addendum Note (Signed)
Addended by: Eden Emms on: 04/01/2023 07:54 AM   Modules accepted: Orders

## 2023-04-08 ENCOUNTER — Other Ambulatory Visit (HOSPITAL_COMMUNITY): Payer: Self-pay

## 2023-04-16 ENCOUNTER — Other Ambulatory Visit (HOSPITAL_COMMUNITY): Payer: Self-pay

## 2023-04-16 ENCOUNTER — Encounter: Payer: Self-pay | Admitting: Nurse Practitioner

## 2023-04-16 ENCOUNTER — Other Ambulatory Visit: Payer: Self-pay

## 2023-04-16 ENCOUNTER — Ambulatory Visit (INDEPENDENT_AMBULATORY_CARE_PROVIDER_SITE_OTHER): Payer: Commercial Managed Care - PPO | Admitting: Nurse Practitioner

## 2023-04-16 VITALS — BP 136/84 | HR 81 | Temp 98.8°F | Resp 16 | Ht 68.0 in | Wt 193.5 lb

## 2023-04-16 DIAGNOSIS — G4700 Insomnia, unspecified: Secondary | ICD-10-CM | POA: Diagnosis not present

## 2023-04-16 DIAGNOSIS — F909 Attention-deficit hyperactivity disorder, unspecified type: Secondary | ICD-10-CM

## 2023-04-16 DIAGNOSIS — R634 Abnormal weight loss: Secondary | ICD-10-CM | POA: Insufficient documentation

## 2023-04-16 DIAGNOSIS — G43829 Menstrual migraine, not intractable, without status migrainosus: Secondary | ICD-10-CM

## 2023-04-16 DIAGNOSIS — F3341 Major depressive disorder, recurrent, in partial remission: Secondary | ICD-10-CM | POA: Diagnosis not present

## 2023-04-16 DIAGNOSIS — Z0000 Encounter for general adult medical examination without abnormal findings: Secondary | ICD-10-CM | POA: Diagnosis not present

## 2023-04-16 DIAGNOSIS — F411 Generalized anxiety disorder: Secondary | ICD-10-CM | POA: Diagnosis not present

## 2023-04-16 DIAGNOSIS — Z1211 Encounter for screening for malignant neoplasm of colon: Secondary | ICD-10-CM

## 2023-04-16 DIAGNOSIS — Z1231 Encounter for screening mammogram for malignant neoplasm of breast: Secondary | ICD-10-CM

## 2023-04-16 DIAGNOSIS — F3289 Other specified depressive episodes: Secondary | ICD-10-CM

## 2023-04-16 NOTE — Assessment & Plan Note (Signed)
Patient currently maintained on Vyvanse 30 mg daily.  Continue medication as prescribed

## 2023-04-16 NOTE — Assessment & Plan Note (Signed)
Patient currently maintained on duloxetine 90 mg total she will take 60 mg capsule and a 30 mg capsule.  Patient denies HI SI/AVH.  Continue medication as prescribed PHQ-9 and GAD-7 administered in office

## 2023-04-16 NOTE — Progress Notes (Signed)
Established Patient Office Visit  Subjective   Patient ID: Sherri Miller, female    DOB: 07/07/1976  Age: 47 y.o. MRN: [id number]  Chief Complaint  Patient presents with   Annual Exam    HPI  for complete physical and follow up of chronic conditions. ADHD  Anxiety/depression: currently  on duloxetine 90mg  daily.  Patient states medication is working well result lost her father unexpectedly and it did help her through a rough time  Asthma: states that she is using the advair as needed along with the albuterol as needed.  She is still taking the Singulair  PE: never was seen by hematology. Has been off Eliquis for 5 months. States that she was on it for several years.    Insomnia: currently on trazodone 150mg  at night. States that it is a mood stabilizer  Immunizations: -Tetanus: Completed in within 10 years -Influenza: Completed this season -Shingles: Too young -Pneumonia: Too young -covid: pfizer   Diet: Fair diet. States that she is doing 3 meals a day with protein and calorie goal. Has a water goal. She is followed by [employer] Exercise: walking at least a mile on her days off. With employment   Eye exam: Completes annually. contacts  Dental exam: Completes semi-annually    Colonoscopy: due. cologuard Lung Cancer Screening:  N/A   Pap smear: [facility]  Mammogram: 2018, [facility]  Sleep: states that she will go aorun 11-7 when off work. States that she feels rested.   Dexa: Too young       Review of Systems  Constitutional:  Negative for chills and fever.  Respiratory:  Negative for shortness of breath.   Cardiovascular:  Positive for palpitations. Negative for chest pain and leg swelling.  Gastrointestinal:  Negative for abdominal pain, blood in stool, constipation, diarrhea, nausea and vomiting.       BM daily   Genitourinary:  Negative for dysuria and hematuria.  Neurological:  Negative for tingling and headaches.   Psychiatric/Behavioral:  Negative for hallucinations and suicidal ideas.       Objective:     BP 136/84   Pulse 81   Temp 98.8 F (37.1 C)   Resp 16   Ht 5\' 8"  (1.727 m)   Wt 193 lb 8 oz (87.8 kg)   SpO2 98%   BMI 29.42 kg/m  BP Readings from Last 3 Encounters:  04/16/23 136/84  12/19/22 129/89  10/23/22 118/70   Wt Readings from Last 3 Encounters:  04/16/23 193 lb 8 oz (87.8 kg)  10/23/22 208 lb 2 oz (94.4 kg)  07/01/22 223 lb (101.2 kg)      Physical Exam Vitals and nursing note reviewed.  Constitutional:      Appearance: Normal appearance.  HENT:     Right Ear: Tympanic membrane, ear canal and external ear normal.     Left Ear: Tympanic membrane, ear canal and external ear normal.     Mouth/Throat:     Mouth: Mucous membranes are moist.     Pharynx: Oropharynx is clear.  Eyes:     Extraocular Movements: Extraocular movements intact.     Pupils: Pupils are equal, round, and reactive to light.  Cardiovascular:     Rate and Rhythm: Normal rate and regular rhythm.     Pulses: Normal pulses.     Heart sounds: Normal heart sounds.  Pulmonary:     Effort: Pulmonary effort is normal.     Breath  sounds: Normal breath sounds.  Abdominal:     General: Bowel sounds are normal. There is no distension.     Palpations: There is no mass.     Tenderness: There is no abdominal tenderness.     Hernia: No hernia is present.  Musculoskeletal:     Right lower leg: No edema.     Left lower leg: No edema.  Lymphadenopathy:     Cervical: No cervical adenopathy.  Skin:    General: Skin is warm.  Neurological:     General: No focal deficit present.     Mental Status: She is alert.     Deep Tendon Reflexes:     Reflex Scores:      Bicep reflexes are 2+ on the right side and 2+ on the left side.      Patellar reflexes are 2+ on the right side and 2+ on the left side.    Comments: Bilateral upper and lower extremity strength 5/5  Psychiatric:        Mood and Affect: Mood  normal.        Behavior: Behavior normal.        Thought Content: Thought content normal.        Judgment: Judgment normal.      No results found for any visits on 04/16/23.    The 10-year ASCVD risk score (Arnett DK, et al., 2019) is: 1%* (Cholesterol units were assumed)    Assessment & Plan:   Problem List Items Addressed This Visit       Cardiovascular and Mediastinum   Headache, menstrual migraine    Will use rizatriptan as needed.  Stable        Other   GAD (generalized anxiety disorder)    Patient does have Klonopin to use as needed along with duloxetine 90 mg total.  PHQ-9 and GAD-7 administered in office continue medication as prescribed      Depression   Attention deficit hyperactivity disorder (ADHD)    Patient currently maintained on Vyvanse 30 mg daily.  Continue medication as prescribed      Major depressive disorder, recurrent episode, in partial remission (HCC)    Patient currently maintained on duloxetine 90 mg total she will take 60 mg capsule and a 30 mg capsule.  Patient denies HI SI/AVH.  Continue medication as prescribed PHQ-9 and GAD-7 administered in office      Insomnia    Patient currently maintained on trazodone 150 mg nightly.  Tolerating medication well effective continue      Excessive body weight loss    Patient has lost a lot of weight intentionally.  States she has sagging skin that is touching rub along with some back and shoulder pain due to her pendulous breasts.  Ambulatory referral to plastic surgery      Relevant Orders   Ambulatory referral to Plastic Surgery   Preventative health care - Primary    Discussed age-appropriate immunizations and screening exams.  Did review patient's surgical personal social and family histories.  Patient is up-to-date on all vaccinations for her age range.  Cologuard ordered for CRC screening mammogram ordered for breast cancer screening patient will update her Pap smear at [facility] per her  report.  Patient was given information at discharge about preventative healthcare maintenance with anticipatory guidance.      Other Visit Diagnoses     Screening mammogram for breast cancer       Relevant Orders   MM 3D SCREENING MAMMOGRAM  BILATERAL BREAST   Screening for colon cancer       Relevant Orders   Cologuard       Return in about 6 months (around 10/17/2023) for ADHD med recheck .    Audria Nine, NP

## 2023-04-16 NOTE — Assessment & Plan Note (Signed)
Patient does have Klonopin to use as needed along with duloxetine 90 mg total.  PHQ-9 and GAD-7 administered in office continue medication as prescribed

## 2023-04-16 NOTE — Assessment & Plan Note (Signed)
Patient has been referred to hematology but has never went.  She is no longer on anticoagulation.  Unsure if it was provoked or unprovoked.  Patient was on anticoagulation for several years.  Recommend following up with hematology

## 2023-04-16 NOTE — Assessment & Plan Note (Signed)
Patient currently maintained on trazodone 150 mg nightly.  Tolerating medication well effective continue

## 2023-04-16 NOTE — Patient Instructions (Addendum)
Nice to see you today  Send me the updated labs when you get them Get your pap and mammogram updated Just call the GI breast center. I have placed the order Follow up with me in 6 months, sooner if you need me

## 2023-04-16 NOTE — Assessment & Plan Note (Signed)
Discussed age-appropriate immunizations and screening exams.  Did review patient's surgical personal social and family histories.  Patient is up-to-date on all vaccinations for her age range.  Cologuard ordered for CRC screening mammogram ordered for breast cancer screening patient will update her Pap smear at [facility] per her report.  Patient was given information at discharge about preventative healthcare maintenance with anticipatory guidance.

## 2023-04-16 NOTE — Assessment & Plan Note (Signed)
Will use rizatriptan as needed.  Stable

## 2023-04-16 NOTE — Assessment & Plan Note (Signed)
Patient has lost a lot of weight intentionally.  States she has sagging skin that is touching rub along with some back and shoulder pain due to her pendulous breasts.  Ambulatory referral to plastic surgery

## 2023-04-27 ENCOUNTER — Encounter: Payer: Self-pay | Admitting: Plastic Surgery

## 2023-04-27 ENCOUNTER — Ambulatory Visit: Payer: Commercial Managed Care - PPO | Admitting: Plastic Surgery

## 2023-04-27 VITALS — BP 118/77 | HR 74 | Ht 68.0 in | Wt 196.2 lb

## 2023-04-27 DIAGNOSIS — Z86718 Personal history of other venous thrombosis and embolism: Secondary | ICD-10-CM

## 2023-04-27 DIAGNOSIS — N62 Hypertrophy of breast: Secondary | ICD-10-CM | POA: Diagnosis not present

## 2023-04-27 DIAGNOSIS — Z6829 Body mass index (BMI) 29.0-29.9, adult: Secondary | ICD-10-CM | POA: Diagnosis not present

## 2023-04-27 DIAGNOSIS — M542 Cervicalgia: Secondary | ICD-10-CM

## 2023-04-27 DIAGNOSIS — M546 Pain in thoracic spine: Secondary | ICD-10-CM

## 2023-04-27 NOTE — Progress Notes (Signed)
Referring Provider Eden Emms, NP [address] [place],  [place] [address]   CC:  Chief Complaint  Patient presents with   Advice Only      Sherri Miller is an 47 y.o. female.  HPI: Sherri Miller is a 47 year old female who presents with a with complaints of upper back and neck pain which she attributes to the large size of her breast.  She states that she has had large breasts most of her life since she began breast development estimated less than however her upper back and neck pain and difficulty finding bras that fit have become more severe over the past year since she lost 70 pounds.  She is requesting surgical reduction of the size of her breast.  Allergies  Allergen Reactions   Penicillins Hives    Did it involve swelling of the face/tongue/throat, SOB, or low BP?No Did it involve sudden or severe rash/hives, skin peeling, or any reaction on the inside of your mouth or nose? Yes Did you need to seek medical attention at a hospital or doctor's office?No When did it last happen? Teenager    If all above answers are "NO", may proceed with cephalosporin use.   Latex Rash   Oxycodone-Acetaminophen Other (See Comments)    Outpatient Encounter Medications as of 04/27/2023  Medication Sig   albuterol (VENTOLIN HFA) 108 (90 Base) MCG/ACT inhaler Inhale 2 puffs into the lungs every 6 (six) hours as needed for wheezing or shortness of breath.   Calcium Carbonate-Vit D-Min (CALCIUM 1200 PO) Take by mouth. Not sure of the dose   DULoxetine (CYMBALTA) 30 MG capsule Take 1 capsule (30 mg total) by mouth daily. Take with cymbalta 60mg  tablet   DULoxetine (CYMBALTA) 60 MG capsule Take 1 capsule (60 mg total) by mouth daily.   fluticasone-salmeterol (ADVAIR DISKUS) 250-50 MCG/ACT AEPB Inhale 1 puff into the lungs in the morning and at bedtime.   HYDROcodone bit-homatropine (HYCODAN) 5-1.5 MG/5ML syrup Take 5 mLs by mouth every 8 (eight) hours as needed for cough.   liothyronine  (CYTOMEL) 5 MCG tablet Take 2 tablets (10 mcg total) by mouth daily.   lisdexamfetamine (VYVANSE) 30 MG capsule Take 1 capsule (30 mg total) by mouth daily.   lisdexamfetamine (VYVANSE) 30 MG capsule Take 1 capsule (30 mg total) by mouth daily.   [START ON 04/30/2023] lisdexamfetamine (VYVANSE) 30 MG capsule Take 1 capsule (30 mg total) by mouth daily. NEEDS OFFICE VISIT FOR FURTHER REFILLS   montelukast (SINGULAIR) 10 MG tablet TAKE 1 TABLET BY MOUTH AT BEDTIME   ondansetron (ZOFRAN-ODT) 4 MG disintegrating tablet Dissolve 1 tablet on the tongue every 8 hours as needed.   Prasterone, DHEA, (DHEA 50 PO) Take by mouth.   rizatriptan (MAXALT) 10 MG tablet Take 1 tablet (10 mg total) by mouth as needed for migraine. May repeat in 2 hours if needed. NO more than 2 doses in 24 hours   Semaglutide-Weight Management (WEGOVY) 2.4 MG/0.75ML SOAJ Inject 2.4 mg into the skin once a week.   traZODone (DESYREL) 150 MG tablet Take 1 tablet by mouth at bedtime.   VITAMIN D PO Take by mouth. With K-5000 u ?   Facility-Administered Encounter Medications as of 04/27/2023  Medication   betamethasone acetate-betamethasone sodium phosphate (CELESTONE) injection 3 mg     Past Medical History:  Diagnosis Date   Allergy    Asthma    Chicken pox    COPD (chronic obstructive pulmonary disease) (HCC)  Depression    Migraine    Migraines    Pulmonary embolism (HCC)    Pulmonary embolism (HCC) 01/2020    Past Surgical History:  Procedure Laterality Date   CHOLECYSTECTOMY      Family History  Adopted: Yes  Family history unknown: Yes    Social History   Social History Narrative   Works as a Copy.     Review of Systems General: Denies fevers, chills, weight loss CV: Denies chest pain, shortness of breath, palpitations Breast: Patient has large pendulous breasts which she attributes to the upper back and neck pain that she is currently having.  Physical Exam    04/27/2023    2:56  PM 04/16/2023    2:33 PM 12/19/2022    2:30 PM  Vitals with BMI  Height 5\' 8"  5\' 8"    Weight 196 lbs 3 oz 193 lbs 8 oz   BMI 29.84 29.43   Systolic 118 136   Diastolic 77 84   Pulse 74 81 70    General:  No acute distress,  Alert and oriented, Non-Toxic, Normal speech and affect Breast: The patient has large pendulous breasts with grade 3 ptosis.  She does not have any dominant masses on physical exam and the nipples are normal in appearance without evidence of nipple discharge.  Her sternal notch to nipple distance on the right is 33 cm and 34 cm on the left her fold to nipple distance is 15 on the right 13 on the left. Mammogram: Patient's last recorded mammogram was in 2018 and was BI-RADS 1.  She currently has an active order for a mammogram and was told that she will have to have this completed prior to scheduling surgery. Assessment/Plan Macromastia: Patient has moderately large breasts and would likely benefit from a bilateral breast reduction.  I believe that I can remove 600 g per breast.  We discussed the conduct of a breast reduction at length.  I showed her the location of the incision and we discussed the unpredictable nature of scarring.  We discussed strategies to improve the quality of the scars postoperatively.  We discussed the risk of bleeding, infection, and seroma formation.  The patient understands that she will have drains will be kept in at least overnight.  She also understands the risk of nipple loss due to nipple ischemia.  We discussed the fact that after breast reduction can sometimes be more difficult to interpret mammograms in patients that have had breast reductions are often more likely to have breast biopsies to rule out abnormalities.  The patient understands that she will have certain limitations postoperatively.  These include no heavy lifting greater than 20 pounds, no vigorous activity, and no submerging the incisions in water.  She may return to light activity as  tolerated.  The patient does have a history of pulmonary embolus.  Her pulmonary embolus was attributed to her COVID-vaccine and she has no other known risk factors.  She is no longer on blood thinners.  We discussed this at length.  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  We will not use pharmacologic therapy during her surgery.  She understands that she will need to wear a compressive garment postoperatively.  All questions were answered to her satisfaction.  Photographs were obtained today with her consent.  Will submit a surgical route for a bilateral breast reduction per request. She is  also requesting a surgical quote for an abdominoplasty.  Santiago Glad 04/27/2023, 4:52 PM

## 2023-04-28 ENCOUNTER — Encounter: Payer: Self-pay | Admitting: Nurse Practitioner

## 2023-04-28 DIAGNOSIS — N62 Hypertrophy of breast: Secondary | ICD-10-CM

## 2023-04-29 ENCOUNTER — Telehealth: Payer: Commercial Managed Care - PPO | Admitting: Physician Assistant

## 2023-04-29 ENCOUNTER — Other Ambulatory Visit (HOSPITAL_COMMUNITY): Payer: Self-pay

## 2023-04-29 DIAGNOSIS — H00015 Hordeolum externum left lower eyelid: Secondary | ICD-10-CM | POA: Diagnosis not present

## 2023-04-29 MED ORDER — POLYMYXIN B-TRIMETHOPRIM 10000-0.1 UNIT/ML-% OP SOLN
OPHTHALMIC | 0 refills | Status: AC
Start: 2023-04-29 — End: ?
  Filled 2023-04-29: qty 10, 7d supply, fill #0

## 2023-04-29 NOTE — Progress Notes (Signed)
  E-Visit for Stye   We are sorry that you are not feeling well. Here is how we plan to help!  Based on what you have shared with me it looks like you have a stye.  A stye is an inflammation of the eyelid.  It is often a red, painful lump near the edge of the eyelid that may look like a boil or a pimple.  A stye develops when an infection occurs at the base of an eyelash.   We have made appropriate suggestions for you based upon your presentation: Simple styes can be treated without medical intervention.  Most styes either resolve spontaneously or resolve with simple home treatment by applying warm compresses or heated washcloth to the stye for about 10-15 minutes three to four times a day. This causes the stye to drain and resolve. This can take a week or so.   HOME CARE:  Wash your hands often! Let the stye open on its own. Don't squeeze or open it. Don't rub your eyes. This can irritate your eyes and let in bacteria.  If you need to touch your eyes, wash your hands first. Don't wear eye makeup or contact lenses until the area has healed.  GET HELP RIGHT AWAY IF:  Your symptoms do not improve. You develop blurred or loss of vision. Your symptoms worsen (increased discharge, pain or redness).   Thank you for choosing an e-visit.  Your e-visit answers were reviewed by a board certified advanced clinical practitioner to complete your personal care plan. Depending upon the condition, your plan could have included both over the counter or prescription medications.  Please review your pharmacy choice. Make sure the pharmacy is open so you can pick up prescription now. If there is a problem, you may contact your provider through [employer] and have the prescription routed to another pharmacy.  Your safety is important to [place]. If you have drug allergies check your prescription carefully.   For the next 24 hours you can use MyChart to ask questions about today's visit, request a  non-urgent call back, or ask for a work or school excuse. You will get an email in the next two days asking about your experience. I hope that your e-visit has been valuable and will speed your recovery.

## 2023-04-29 NOTE — Progress Notes (Signed)
I have spent 5 minutes in review of e-visit questionnaire, review and updating patient chart, medical decision making and response to patient.   Dondre Catalfamo Cody Jaquay Morneault, PA-C    

## 2023-04-29 NOTE — Addendum Note (Signed)
Addended by: Waldon Merl on: 04/29/2023 12:43 PM   Modules accepted: Orders

## 2023-05-05 DIAGNOSIS — E559 Vitamin D deficiency, unspecified: Secondary | ICD-10-CM | POA: Diagnosis not present

## 2023-05-05 DIAGNOSIS — E038 Other specified hypothyroidism: Secondary | ICD-10-CM | POA: Diagnosis not present

## 2023-05-05 DIAGNOSIS — K219 Gastro-esophageal reflux disease without esophagitis: Secondary | ICD-10-CM | POA: Diagnosis not present

## 2023-05-05 DIAGNOSIS — R5382 Chronic fatigue, unspecified: Secondary | ICD-10-CM | POA: Diagnosis not present

## 2023-05-05 DIAGNOSIS — Z6829 Body mass index (BMI) 29.0-29.9, adult: Secondary | ICD-10-CM | POA: Diagnosis not present

## 2023-05-11 ENCOUNTER — Encounter: Payer: Self-pay | Admitting: *Deleted

## 2023-05-14 ENCOUNTER — Other Ambulatory Visit (HOSPITAL_COMMUNITY): Payer: Self-pay

## 2023-05-14 ENCOUNTER — Encounter: Payer: Self-pay | Admitting: Nurse Practitioner

## 2023-05-14 ENCOUNTER — Other Ambulatory Visit: Payer: Self-pay | Admitting: Nurse Practitioner

## 2023-05-14 DIAGNOSIS — F909 Attention-deficit hyperactivity disorder, unspecified type: Secondary | ICD-10-CM

## 2023-05-14 DIAGNOSIS — F321 Major depressive disorder, single episode, moderate: Secondary | ICD-10-CM

## 2023-05-14 MED ORDER — LIOTHYRONINE SODIUM 5 MCG PO TABS
10.0000 ug | ORAL_TABLET | Freq: Every day | ORAL | 2 refills | Status: DC
  Filled 2023-05-14: qty 60, 30d supply, fill #0
  Filled 2023-06-19: qty 60, 30d supply, fill #1
  Filled 2023-07-17 – 2023-08-19 (×5): qty 60, 30d supply, fill #2

## 2023-05-14 MED ORDER — DULOXETINE HCL 30 MG PO CPEP
30.0000 mg | ORAL_CAPSULE | Freq: Every day | ORAL | 0 refills | Status: DC
Start: 2023-05-14 — End: 2023-09-21
  Filled 2023-05-14 – 2023-06-04 (×3): qty 90, 90d supply, fill #0

## 2023-05-15 ENCOUNTER — Other Ambulatory Visit (HOSPITAL_COMMUNITY): Payer: Self-pay

## 2023-05-25 ENCOUNTER — Other Ambulatory Visit (HOSPITAL_COMMUNITY): Payer: Self-pay

## 2023-06-03 ENCOUNTER — Institutional Professional Consult (permissible substitution): Payer: Commercial Managed Care - PPO | Admitting: Plastic Surgery

## 2023-06-04 ENCOUNTER — Other Ambulatory Visit (HOSPITAL_COMMUNITY): Payer: Self-pay

## 2023-06-04 ENCOUNTER — Other Ambulatory Visit: Payer: Self-pay

## 2023-06-04 ENCOUNTER — Other Ambulatory Visit: Payer: Self-pay | Admitting: Nurse Practitioner

## 2023-06-04 DIAGNOSIS — F909 Attention-deficit hyperactivity disorder, unspecified type: Secondary | ICD-10-CM

## 2023-06-05 ENCOUNTER — Ambulatory Visit
Admission: RE | Admit: 2023-06-05 | Discharge: 2023-06-05 | Disposition: A | Discharge status: Home / Self Care | Payer: Commercial Managed Care - PPO | Source: Ambulatory Visit | Attending: Nurse Practitioner | Admitting: Nurse Practitioner

## 2023-06-05 DIAGNOSIS — Z1231 Encounter for screening mammogram for malignant neoplasm of breast: Secondary | ICD-10-CM

## 2023-06-10 ENCOUNTER — Other Ambulatory Visit: Payer: Self-pay

## 2023-06-10 ENCOUNTER — Other Ambulatory Visit (HOSPITAL_COMMUNITY): Payer: Self-pay

## 2023-06-15 DIAGNOSIS — L304 Erythema intertrigo: Secondary | ICD-10-CM | POA: Diagnosis not present

## 2023-06-15 DIAGNOSIS — M549 Dorsalgia, unspecified: Secondary | ICD-10-CM | POA: Diagnosis not present

## 2023-06-15 DIAGNOSIS — N62 Hypertrophy of breast: Secondary | ICD-10-CM | POA: Diagnosis not present

## 2023-06-15 DIAGNOSIS — M542 Cervicalgia: Secondary | ICD-10-CM | POA: Diagnosis not present

## 2023-06-15 DIAGNOSIS — G8929 Other chronic pain: Secondary | ICD-10-CM | POA: Diagnosis not present

## 2023-06-16 ENCOUNTER — Telehealth: Payer: Self-pay | Admitting: *Deleted

## 2023-06-16 NOTE — Telephone Encounter (Signed)
Spoke with patient, she saw another provider but wants to go with Dr. Ladona Ridgel.  Will submit new auth today.

## 2023-06-19 ENCOUNTER — Other Ambulatory Visit: Payer: Self-pay | Admitting: Nurse Practitioner

## 2023-06-19 ENCOUNTER — Telehealth: Payer: Self-pay | Admitting: *Deleted

## 2023-06-19 ENCOUNTER — Other Ambulatory Visit: Payer: Self-pay

## 2023-06-19 DIAGNOSIS — F909 Attention-deficit hyperactivity disorder, unspecified type: Secondary | ICD-10-CM

## 2023-06-19 NOTE — Telephone Encounter (Signed)
LVM notifying patient of insurance denial due to the est amount of tissue to be removed not meeting the medical necessity criteria for [employer].  [employer] requires 1000g, provider estimates 600g

## 2023-06-22 ENCOUNTER — Other Ambulatory Visit: Payer: Self-pay | Admitting: Nurse Practitioner

## 2023-06-22 ENCOUNTER — Other Ambulatory Visit (HOSPITAL_COMMUNITY): Payer: Self-pay

## 2023-06-22 ENCOUNTER — Other Ambulatory Visit: Payer: Self-pay

## 2023-06-22 DIAGNOSIS — F909 Attention-deficit hyperactivity disorder, unspecified type: Secondary | ICD-10-CM

## 2023-06-22 MED ORDER — LISDEXAMFETAMINE DIMESYLATE 30 MG PO CAPS
30.0000 mg | ORAL_CAPSULE | Freq: Every day | ORAL | 0 refills | Status: DC
Start: 2023-06-22 — End: 2023-09-02
  Filled 2023-06-22: qty 30, 30d supply, fill #0

## 2023-06-22 MED ORDER — LISDEXAMFETAMINE DIMESYLATE 30 MG PO CAPS
30.0000 mg | ORAL_CAPSULE | Freq: Every day | ORAL | 0 refills | Status: DC
Start: 2023-06-22 — End: 2023-09-02
  Filled 2023-06-22 – 2023-08-04 (×2): qty 30, 30d supply, fill #0

## 2023-06-22 MED ORDER — MONTELUKAST SODIUM 10 MG PO TABS
10.0000 mg | ORAL_TABLET | Freq: Every day | ORAL | 3 refills | Status: DC
  Filled 2023-06-22: qty 90, 90d supply, fill #0

## 2023-07-17 ENCOUNTER — Other Ambulatory Visit (HOSPITAL_COMMUNITY): Payer: Self-pay

## 2023-07-21 ENCOUNTER — Other Ambulatory Visit (HOSPITAL_COMMUNITY): Payer: Self-pay

## 2023-07-27 ENCOUNTER — Other Ambulatory Visit (HOSPITAL_COMMUNITY): Payer: Self-pay

## 2023-08-04 ENCOUNTER — Other Ambulatory Visit (HOSPITAL_COMMUNITY): Payer: Self-pay

## 2023-08-04 ENCOUNTER — Other Ambulatory Visit: Payer: Self-pay

## 2023-08-05 ENCOUNTER — Other Ambulatory Visit (HOSPITAL_COMMUNITY): Payer: Self-pay

## 2023-08-14 ENCOUNTER — Other Ambulatory Visit (HOSPITAL_COMMUNITY): Payer: Self-pay

## 2023-08-19 ENCOUNTER — Other Ambulatory Visit (HOSPITAL_COMMUNITY): Payer: Self-pay

## 2023-09-01 ENCOUNTER — Other Ambulatory Visit (HOSPITAL_COMMUNITY): Payer: Self-pay

## 2023-09-01 ENCOUNTER — Encounter: Payer: Self-pay | Admitting: Nurse Practitioner

## 2023-09-02 ENCOUNTER — Other Ambulatory Visit (HOSPITAL_COMMUNITY): Payer: Self-pay

## 2023-09-02 MED ORDER — LISDEXAMFETAMINE DIMESYLATE 30 MG PO CAPS: 30.0000 mg | ORAL_CAPSULE | Freq: Every day | ORAL | 0 refills | Status: DC

## 2023-09-08 ENCOUNTER — Other Ambulatory Visit (HOSPITAL_COMMUNITY): Payer: Self-pay

## 2023-09-08 MED ORDER — WEGOVY 2.4 MG/0.75ML ~~LOC~~ SOAJ
0.7500 mL | SUBCUTANEOUS | 1 refills | Status: DC
Start: 2023-09-08 — End: 2023-11-11
  Filled 2023-09-08: qty 1.5, 28d supply, fill #0
  Filled 2023-09-11 – 2023-09-15 (×2): qty 3, 28d supply, fill #0
  Filled 2023-10-09: qty 3, 28d supply, fill #1

## 2023-09-10 ENCOUNTER — Other Ambulatory Visit (HOSPITAL_COMMUNITY): Payer: Self-pay

## 2023-09-11 ENCOUNTER — Other Ambulatory Visit (HOSPITAL_COMMUNITY): Payer: Self-pay

## 2023-09-15 ENCOUNTER — Other Ambulatory Visit (HOSPITAL_COMMUNITY): Payer: Self-pay

## 2023-09-16 ENCOUNTER — Other Ambulatory Visit (HOSPITAL_COMMUNITY): Payer: Self-pay

## 2023-09-21 ENCOUNTER — Encounter: Payer: Self-pay | Admitting: Nurse Practitioner

## 2023-09-21 DIAGNOSIS — F321 Major depressive disorder, single episode, moderate: Secondary | ICD-10-CM

## 2023-09-21 MED ORDER — DULOXETINE HCL 30 MG PO CPEP
30.0000 mg | ORAL_CAPSULE | Freq: Every day | ORAL | 1 refills | Status: DC
Start: 2023-09-21 — End: 2023-10-22

## 2023-09-21 MED ORDER — DULOXETINE HCL 60 MG PO CPEP
60.0000 mg | ORAL_CAPSULE | Freq: Every day | ORAL | 1 refills | Status: DC
Start: 2023-09-21 — End: 2023-10-22

## 2023-10-09 ENCOUNTER — Other Ambulatory Visit (HOSPITAL_COMMUNITY): Payer: Self-pay

## 2023-10-12 ENCOUNTER — Other Ambulatory Visit (HOSPITAL_COMMUNITY): Payer: Self-pay

## 2023-10-13 ENCOUNTER — Other Ambulatory Visit (HOSPITAL_COMMUNITY): Payer: Self-pay

## 2023-10-14 ENCOUNTER — Other Ambulatory Visit (HOSPITAL_COMMUNITY): Payer: Self-pay

## 2023-10-19 ENCOUNTER — Ambulatory Visit: Payer: PRIVATE HEALTH INSURANCE | Admitting: Nurse Practitioner

## 2023-10-19 VITALS — BP 102/82 | HR 84 | Temp 98.5°F | Ht 68.0 in | Wt 204.4 lb

## 2023-10-19 DIAGNOSIS — M545 Low back pain, unspecified: Secondary | ICD-10-CM | POA: Diagnosis not present

## 2023-10-19 DIAGNOSIS — G8929 Other chronic pain: Secondary | ICD-10-CM | POA: Insufficient documentation

## 2023-10-19 DIAGNOSIS — F909 Attention-deficit hyperactivity disorder, unspecified type: Secondary | ICD-10-CM

## 2023-10-19 DIAGNOSIS — G4700 Insomnia, unspecified: Secondary | ICD-10-CM | POA: Diagnosis not present

## 2023-10-19 MED ORDER — METHOCARBAMOL 500 MG PO TABS: 500.0000 mg | ORAL_TABLET | Freq: Three times a day (TID) | ORAL | 0 refills | Status: AC | PRN

## 2023-10-19 MED ORDER — TRAZODONE HCL 150 MG PO TABS
150.0000 mg | ORAL_TABLET | Freq: Every day | ORAL | 0 refills | Status: DC
Start: 2023-10-19 — End: 2023-10-19

## 2023-10-19 MED ORDER — TRAZODONE HCL 150 MG PO TABS: 150.0000 mg | ORAL_TABLET | Freq: Every day | ORAL | 1 refills | Status: DC

## 2023-10-19 NOTE — Patient Instructions (Signed)
Nice to see you today Follow up with me in 6 months for your physical and labs, sooner if you need me
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
## 2023-10-19 NOTE — Assessment & Plan Note (Signed)
Patient currently maintained on Vyvanse 30 mg daily.  Update controlled substance agreement and urinary drug screen today.  Patient is tolerating and doing well on the medication.

## 2023-10-20 LAB — DRUG MONITORING, PANEL 8 WITH CONFIRMATION, URINE
6 Acetylmorphine: NEGATIVE ng/mL (ref <10)
Alcohol Metabolites: NEGATIVE ng/mL (ref <500)
Amphetamines: NEGATIVE ng/mL (ref <500)
Benzodiazepines: NEGATIVE ng/mL (ref <100)
Buprenorphine, Urine: NEGATIVE ng/mL (ref <5)
Cocaine Metabolite: NEGATIVE ng/mL (ref <150)
Creatinine: 116.5 mg/dL (ref >=20.0)
MDMA: NEGATIVE ng/mL (ref <500)
Marijuana Metabolite: NEGATIVE ng/mL (ref <20)
Opiates: NEGATIVE ng/mL (ref <100)
Oxidant: NEGATIVE ug/mL (ref <200)
Oxycodone: NEGATIVE ng/mL (ref <100)
pH: 6.3 (ref 4.5–9.0)

## 2023-10-20 LAB — DM TEMPLATE

## 2023-10-21 ENCOUNTER — Encounter: Payer: Self-pay | Admitting: Nurse Practitioner

## 2023-10-21 ENCOUNTER — Other Ambulatory Visit: Payer: Self-pay | Admitting: Nurse Practitioner

## 2023-10-21 DIAGNOSIS — F321 Major depressive disorder, single episode, moderate: Secondary | ICD-10-CM

## 2023-10-21 DIAGNOSIS — F909 Attention-deficit hyperactivity disorder, unspecified type: Secondary | ICD-10-CM

## 2023-10-22 MED ORDER — LISDEXAMFETAMINE DIMESYLATE 30 MG PO CAPS
30.0000 mg | ORAL_CAPSULE | Freq: Every day | ORAL | 0 refills | Status: DC
Start: 2023-10-22 — End: 2024-01-14

## 2023-10-22 MED ORDER — DULOXETINE HCL 60 MG PO CPEP: 60.0000 mg | ORAL_CAPSULE | Freq: Every day | ORAL | 1 refills | Status: DC

## 2023-10-22 MED ORDER — DULOXETINE HCL 30 MG PO CPEP: 30.0000 mg | ORAL_CAPSULE | Freq: Every day | ORAL | 1 refills | Status: DC

## 2023-11-09 ENCOUNTER — Other Ambulatory Visit (HOSPITAL_COMMUNITY): Payer: Self-pay

## 2023-11-09 ENCOUNTER — Encounter: Payer: Self-pay | Admitting: Nurse Practitioner

## 2023-11-11 MED ORDER — WEGOVY 2.4 MG/0.75ML ~~LOC~~ SOAJ: 2.4000 mg | SUBCUTANEOUS | 0 refills | Status: DC

## 2024-01-14 ENCOUNTER — Other Ambulatory Visit: Payer: Self-pay | Admitting: Nurse Practitioner

## 2024-01-14 DIAGNOSIS — F909 Attention-deficit hyperactivity disorder, unspecified type: Secondary | ICD-10-CM

## 2024-01-14 NOTE — Telephone Encounter (Signed)
 Last OV: 10/19/23 Pending OV: 04/20/24 Medication: Vyvanse  30mg  Directions: Take one capsule daily Last Refill: 10/22/23 Qty: #30 with 0 refills

## 2024-01-15 MED ORDER — LISDEXAMFETAMINE DIMESYLATE 30 MG PO CAPS: 30.0000 mg | ORAL_CAPSULE | Freq: Every day | ORAL | 0 refills | Status: DC

## 2024-01-18 ENCOUNTER — Ambulatory Visit: Payer: PRIVATE HEALTH INSURANCE | Admitting: Nurse Practitioner

## 2024-01-19 ENCOUNTER — Encounter: Payer: Self-pay | Admitting: Nurse Practitioner

## 2024-02-05 ENCOUNTER — Ambulatory Visit: Payer: PRIVATE HEALTH INSURANCE | Admitting: Nurse Practitioner

## 2024-02-05 VITALS — BP 110/80 | HR 66 | Temp 97.9°F | Ht 68.0 in | Wt 210.6 lb

## 2024-02-05 DIAGNOSIS — F324 Major depressive disorder, single episode, in partial remission: Secondary | ICD-10-CM

## 2024-02-05 DIAGNOSIS — F411 Generalized anxiety disorder: Secondary | ICD-10-CM

## 2024-02-05 DIAGNOSIS — E669 Obesity, unspecified: Secondary | ICD-10-CM | POA: Insufficient documentation

## 2024-02-05 MED ORDER — WEGOVY 2.4 MG/0.75ML ~~LOC~~ SOAJ: 2.4000 mg | SUBCUTANEOUS | 0 refills | Status: DC

## 2024-02-05 NOTE — Patient Instructions (Signed)
 Nice to see you today Continue working on the diet and exercise Follow up with me as scheduled, sooner if you need me

## 2024-02-05 NOTE — Assessment & Plan Note (Signed)
 Patient was on duloxetine 90 mg daily but was feeling "numb".  She self discontinued duloxetine 30 mg and continue duloxetine 60 mg.  Continue duloxetine 60 mg total daily.

## 2024-02-05 NOTE — Assessment & Plan Note (Signed)
 Patient currently maintained on Wegovy 2.4 mg daily.  Encouraged to get back on the bandwagon and continue her lifestyle modifications inclusive of diet and exercise.  Continue Wegovy 2.4 mg once weekly.

## 2024-02-05 NOTE — Progress Notes (Signed)
 Established Patient Office Visit  Subjective   Patient ID: Sherri Miller, female    DOB: 07/27/1976  Age: 48 y.o. MRN: [id number]  Chief Complaint  Patient presents with   Medication Refill    Wegovy      Obesity: patient is currently on wegocy 2.4mg  once a week. She was getting this from a different clinic previously and ask that I take over the management of it  Highest weight with me was 58 State that the past week was a cruise that was 7 days and she did not eat the best.  She is tolerating the wegovy  well. States that she is popping daily to every other day. States that she is has not been exercising. States that she hates cold weather and going to gym    MDD: states that she did come off the duloxetine 30 and jjust doing the 60mg . States that she felt that she was just too "numb". States that she has been off for the past 2 months. She did wean herself off and is doing well. No HI/SI.AVH      Review of Systems  Constitutional:  Negative for chills and fever.  Respiratory:  Negative for shortness of breath.   Cardiovascular:  Negative for chest pain.  Gastrointestinal:  Negative for abdominal pain, constipation, diarrhea, nausea and vomiting.  Psychiatric/Behavioral:  Negative for hallucinations and suicidal ideas.       Objective:     BP 110/80   Pulse 66   Temp 97.9 F (36.6 C) (Oral)   Ht 5\' 8"  (1.727 m)   Wt 210 lb 9.6 oz (95.5 kg)   SpO2 99%   BMI 32.02 kg/m  BP Readings from Last 3 Encounters:  02/05/24 110/80  10/19/23 102/82  04/27/23 118/77   Wt Readings from Last 3 Encounters:  02/05/24 210 lb 9.6 oz (95.5 kg)  10/19/23 204 lb 6.4 oz (92.7 kg)  04/27/23 196 lb 3.2 oz (89 kg)   SpO2 Readings from Last 3 Encounters:  02/05/24 99%  10/19/23 96%  04/27/23 99%      Physical Exam Vitals and nursing note reviewed.  Constitutional:      Appearance: Normal appearance.  Cardiovascular:     Rate and Rhythm: Normal rate and regular rhythm.      Heart sounds: Normal heart sounds.  Pulmonary:     Effort: Pulmonary effort is normal.     Breath sounds: Normal breath sounds.  Abdominal:     General: Bowel sounds are normal.  Neurological:     Mental Status: She is alert.      No results found for any visits on 02/05/24.    The 10-year ASCVD risk score (Arnett DK, et al., 2019) is: 0.6%* (Cholesterol units were assumed)    Assessment & Plan:   Problem List Items Addressed This Visit       Other   GAD (generalized anxiety disorder)   Patient was on duloxetine 90 mg daily but was feeling "numb".  She self discontinued duloxetine 30 mg and continue duloxetine 60 mg.  Continue duloxetine 60 mg total daily.      Depression   Patient was on duloxetine 90 mg daily.  This was beneficial after her father had passed but since doing better with that she has felt "numb".  Patient discontinued duloxetine 30 mg and continue duloxetine 60 mg.  Patient has HI/SI/AVH.  Continue duloxetine 60 mg daily      Obesity (BMI 30-39.9) - Primary  Patient currently maintained on Wegovy 2.4 mg daily.  Encouraged to get back on the bandwagon and continue her lifestyle modifications inclusive of diet and exercise.  Continue Wegovy 2.4 mg once weekly.      Relevant Medications   Semaglutide-Weight Management (WEGOVY) 2.4 MG/0.75ML SOAJ    Return if symptoms worsen or fail to improve, for As scheduled for CPE .    Audria Nine, NP

## 2024-02-05 NOTE — Assessment & Plan Note (Signed)
 Patient was on duloxetine 90 mg daily.  This was beneficial after her father had passed but since doing better with that she has felt "numb".  Patient discontinued duloxetine 30 mg and continue duloxetine 60 mg.  Patient has HI/SI/AVH.  Continue duloxetine 60 mg daily

## 2024-04-07 ENCOUNTER — Other Ambulatory Visit: Payer: Self-pay | Admitting: Nurse Practitioner

## 2024-04-20 ENCOUNTER — Encounter: Payer: PRIVATE HEALTH INSURANCE | Admitting: Nurse Practitioner

## 2024-04-22 ENCOUNTER — Telehealth: Payer: Self-pay | Admitting: Nurse Practitioner

## 2024-04-22 NOTE — Telephone Encounter (Signed)
 Noted. We can continue refills since I will see her within a week

## 2024-04-22 NOTE — Telephone Encounter (Signed)
 Pt came to office believing she r/s her cpe from 5/14 to today, 5/16. Pt r/s appt to 07/08/24, due to [facility] being booked out until Aug for cpe slots. R/s appt to sooner date, 04/27/24. Pt states she was worried she wouldn't be able to continue to receive refills from [facility]. Call back # [phone]

## 2024-04-25 ENCOUNTER — Other Ambulatory Visit: Payer: Self-pay | Admitting: Nurse Practitioner

## 2024-04-25 ENCOUNTER — Encounter: Payer: Self-pay | Admitting: Nurse Practitioner

## 2024-04-25 DIAGNOSIS — G4700 Insomnia, unspecified: Secondary | ICD-10-CM

## 2024-04-25 MED ORDER — TRAZODONE HCL 150 MG PO TABS: 150.0000 mg | ORAL_TABLET | Freq: Every day | ORAL | 1 refills | Status: DC

## 2024-04-25 NOTE — Telephone Encounter (Signed)
 Copied from CRM [id number]. Topic: Clinical - Medication Refill >> Apr 25, 2024 11:42 AM Howard Macho wrote: Medication: traZODone  (DESYREL ) 150 MG tablet  Has the patient contacted their pharmacy? Yes (Agent: If no, request that the patient contact the pharmacy for the refill. If patient does not wish to contact the pharmacy document the reason why and proceed with request.) (Agent: If yes, when and what did the pharmacy advise?)  This is the patient's preferred pharmacy:   [facility] - [address], [facility] [place] [address] Phone: [phone] Fax: [phone]  Is this the correct pharmacy for this prescription? Yes If no, delete pharmacy and type the correct one.   Has the prescription been filled recently? No  Is the patient out of the medication? Yes  Has the patient been seen for an appointment in the last year OR does the patient have an upcoming appointment? Yes  Can we respond through MyChart? Yes  Agent: Please be advised that Rx refills may take up to 3 business days. We ask that you follow-up with your pharmacy.

## 2024-04-27 ENCOUNTER — Encounter: Payer: Self-pay | Admitting: Nurse Practitioner

## 2024-04-27 ENCOUNTER — Other Ambulatory Visit (HOSPITAL_COMMUNITY): Payer: Self-pay

## 2024-04-27 ENCOUNTER — Telehealth: Payer: Self-pay

## 2024-04-27 ENCOUNTER — Ambulatory Visit (INDEPENDENT_AMBULATORY_CARE_PROVIDER_SITE_OTHER): Payer: PRIVATE HEALTH INSURANCE | Admitting: Nurse Practitioner

## 2024-04-27 VITALS — BP 102/80 | HR 71 | Temp 98.3°F | Ht 67.85 in | Wt 211.0 lb

## 2024-04-27 DIAGNOSIS — F411 Generalized anxiety disorder: Secondary | ICD-10-CM

## 2024-04-27 DIAGNOSIS — Z0000 Encounter for general adult medical examination without abnormal findings: Secondary | ICD-10-CM

## 2024-04-27 DIAGNOSIS — E669 Obesity, unspecified: Secondary | ICD-10-CM | POA: Diagnosis not present

## 2024-04-27 DIAGNOSIS — R202 Paresthesia of skin: Secondary | ICD-10-CM | POA: Diagnosis not present

## 2024-04-27 DIAGNOSIS — Z131 Encounter for screening for diabetes mellitus: Secondary | ICD-10-CM

## 2024-04-27 DIAGNOSIS — G43829 Menstrual migraine, not intractable, without status migrainosus: Secondary | ICD-10-CM

## 2024-04-27 DIAGNOSIS — Z1211 Encounter for screening for malignant neoplasm of colon: Secondary | ICD-10-CM

## 2024-04-27 DIAGNOSIS — Z13220 Encounter for screening for lipoid disorders: Secondary | ICD-10-CM | POA: Diagnosis not present

## 2024-04-27 DIAGNOSIS — F329 Major depressive disorder, single episode, unspecified: Secondary | ICD-10-CM

## 2024-04-27 DIAGNOSIS — F909 Attention-deficit hyperactivity disorder, unspecified type: Secondary | ICD-10-CM

## 2024-04-27 LAB — COMPREHENSIVE METABOLIC PANEL WITH GFR
ALT: 14 U/L (ref 0–35)
AST: 17 U/L (ref 0–37)
Albumin: 4.6 g/dL (ref 3.5–5.2)
Alkaline Phosphatase: 90 U/L (ref 39–117)
BUN: 18 mg/dL (ref 6–23)
CO2: 29 meq/L (ref 19–32)
Calcium: 9.6 mg/dL (ref 8.4–10.5)
Chloride: 103 meq/L (ref 96–112)
Creatinine, Ser: 0.9 mg/dL (ref 0.40–1.20)
GFR: 75.23 mL/min (ref >60.00)
Glucose, Bld: 84 mg/dL (ref 70–99)
Potassium: 4.5 meq/L (ref 3.5–5.1)
Sodium: 139 meq/L (ref 135–145)
Total Bilirubin: 0.6 mg/dL (ref 0.2–1.2)
Total Protein: 7.1 g/dL (ref 6.0–8.3)

## 2024-04-27 LAB — CBC
HCT: 38.5 % (ref 36.0–46.0)
Hemoglobin: 12.9 g/dL (ref 12.0–15.0)
MCHC: 33.6 g/dL (ref 30.0–36.0)
MCV: 84.1 fl (ref 78.0–100.0)
Platelets: 233 10*3/uL (ref 150.0–400.0)
RBC: 4.58 Mil/uL (ref 3.87–5.11)
RDW: 12.9 % (ref 11.5–15.5)
WBC: 4.8 10*3/uL (ref 4.0–10.5)

## 2024-04-27 LAB — VITAMIN B12: Vitamin B-12: 523 pg/mL (ref 211–911)

## 2024-04-27 LAB — TSH: TSH: 1.54 u[IU]/mL (ref 0.35–5.50)

## 2024-04-27 LAB — LIPID PANEL
Cholesterol: 198 mg/dL (ref 0–200)
HDL: 68.2 mg/dL (ref >39.00)
LDL Cholesterol: 114 mg/dL — ABNORMAL HIGH (ref 0–99)
NonHDL: 129.69
Total CHOL/HDL Ratio: 3
Triglycerides: 78 mg/dL (ref 0.0–149.0)
VLDL: 15.6 mg/dL (ref 0.0–40.0)

## 2024-04-27 LAB — HEMOGLOBIN A1C: Hgb A1c MFr Bld: 5.4 % (ref 4.6–6.5)

## 2024-04-27 MED ORDER — WEGOVY 2.4 MG/0.75ML ~~LOC~~ SOAJ: 2.4000 mg | SUBCUTANEOUS | 0 refills | Status: DC

## 2024-04-27 NOTE — Assessment & Plan Note (Signed)
 History of the same.  Patient having more frequent headaches since coming off the hormone replacement therapy through [facility].  She anticipates going back to them and restarting.  Continue using Maxalt  10 mg daily as needed.

## 2024-04-27 NOTE — Assessment & Plan Note (Signed)
 Currently maintained on duloxetine  60 mg daily.  Continue.  Patient denies HI/SI/AVH

## 2024-04-27 NOTE — Patient Instructions (Signed)
 Nice to see you today I will be in touch with the labs once I have them Follow up with me in 3 month, sooner if you need me

## 2024-04-27 NOTE — Assessment & Plan Note (Signed)
 History of the same.  Currently maintained on Wegovy  2.4 mg weekly.  Pending TSH, A1c, lipid panel

## 2024-04-27 NOTE — Assessment & Plan Note (Signed)
 Discussed age-appropriate immunization screening exams.  Did review patient's personal, surgical, social, family histories.  Patient is up-to-date on all age-appropriate vaccinations she would like.  Cologuard ordered for CRC screening.  Patient up-to-date on breast cancer screening and cervical cancer screening.  Patient was given information at discharge about preventative healthcare maintenance with anticipatory guidance.

## 2024-04-27 NOTE — Assessment & Plan Note (Signed)
 Maintained on duloxetine  60 mg daily.  Continue.  Patient denies HI/SI/AVH

## 2024-04-27 NOTE — Assessment & Plan Note (Signed)
 Patient currently maintained on Vyvanse  30 mg daily.  Continue

## 2024-04-27 NOTE — Telephone Encounter (Signed)
 Pharmacy Patient Advocate Encounter   Received notification from Patient Pharmacy that prior authorization for Wegovy  2.4 is required/requested.   Insurance verification completed.   The patient is insured through [employer] .   Per test claim: PA required; PA submitted to above mentioned insurance via CoverMyMeds Key/confirmation #/EOC [id number] Status is pending

## 2024-04-27 NOTE — Progress Notes (Signed)
 Established Patient Office Visit  Subjective   Patient ID: Sherri Miller, female    DOB: 31-Jan-1976  Age: 48 y.o. MRN: [id number]  Chief Complaint  Patient presents with   Annual Exam    HPI  MDD/GAD: Patient currently maintained on duloxetine  60 mg daily. States that she is doing well with mood.   Insomnia: Patient currently maintained on trazodone  150 mg at bedtime  ADHD: Patient currently maintained on Vyvanse  30 mg daily.  Migraine: Patient currently maintained on Maxalt  10 mg as needed. She is having to use the maxalt  almost weekly. Since she has been out of her HRT. She was having then 1-2 times a year with HRT through [facility]   History of obesity: Patient currently maintained on Wegovy  2.4 mg weekly  for complete physical and follow up of chronic conditions.  Immunizations: -Tetanus: Completed in?  Works for hospital -Influenza: Out of season -Shingles: Too young  -Pneumonia: too young  Diet: Fair diet. She is eating 3 meals a day and some snakcs that are healthier version  Exercise: No regular exercise. Limited due to [employer] exam: Completes annually. Glasses up  Dental exam: Completes semi-annually    Colonoscopy: Cologuard ordered Lung Cancer Screening: NA   Pap smear: Does have Dr Sallyanne Creamer [facility] [place]. States that it has been 4 years   Mammogram: 06/05/2023, repeat in 1 year. States she got a letter about doing mobile bus again  Dexa: too young  Sleep: going to bed at 9 with work days and off 11 or so. She is getting 6 hours. She feels rested most of the time        Review of Systems  Constitutional:  Negative for chills and fever.  Respiratory:  Negative for shortness of breath.   Cardiovascular:  Negative for chest pain and leg swelling.  Gastrointestinal:  Negative for abdominal pain, blood in stool, constipation, diarrhea, nausea and vomiting.       Bm dialy   Genitourinary:  Negative for dysuria and hematuria.   Musculoskeletal:  Positive for back pain.  Neurological:  Positive for tingling (right leg and arm  intermittent) and headaches.  Psychiatric/Behavioral:  Negative for hallucinations and suicidal ideas.       Objective:     BP 102/80   Pulse 71   Temp 98.3 F (36.8 C) (Oral)   Ht 5' 7.85" (1.723 m)   Wt 211 lb (95.7 kg)   SpO2 96%   BMI 32.22 kg/m  BP Readings from Last 3 Encounters:  04/27/24 102/80  02/05/24 110/80  10/19/23 102/82   Wt Readings from Last 3 Encounters:  04/27/24 211 lb (95.7 kg)  02/05/24 210 lb 9.6 oz (95.5 kg)  10/19/23 204 lb 6.4 oz (92.7 kg)   SpO2 Readings from Last 3 Encounters:  04/27/24 96%  02/05/24 99%  10/19/23 96%      Physical Exam Vitals and nursing note reviewed.  Constitutional:      Appearance: Normal appearance.  HENT:     Right Ear: Tympanic membrane, ear canal and external ear normal.     Left Ear: Tympanic membrane, ear canal and external ear normal.     Mouth/Throat:     Mouth: Mucous membranes are moist.     Pharynx: Oropharynx is clear.  Eyes:     Extraocular Movements: Extraocular movements intact.     Pupils: Pupils are equal, round, and reactive to light.  Cardiovascular:     Rate  and Rhythm: Normal rate and regular rhythm.     Pulses: Normal pulses.     Heart sounds: Normal heart sounds.  Pulmonary:     Effort: Pulmonary effort is normal.     Breath sounds: Normal breath sounds.  Abdominal:     General: Bowel sounds are normal. There is no distension.     Palpations: There is no mass.     Tenderness: There is no abdominal tenderness.     Hernia: No hernia is present.  Musculoskeletal:     Right lower leg: No edema.     Left lower leg: No edema.  Lymphadenopathy:     Cervical: No cervical adenopathy.  Skin:    General: Skin is warm.  Neurological:     General: No focal deficit present.     Mental Status: She is alert.     Deep Tendon Reflexes:     Reflex Scores:      Bicep reflexes are 2+ on the  right side and 2+ on the left side.      Patellar reflexes are 2+ on the right side and 2+ on the left side.    Comments: Bilateral upper and lower extremity strength 5/5  Psychiatric:        Mood and Affect: Mood normal.        Behavior: Behavior normal.        Thought Content: Thought content normal.        Judgment: Judgment normal.      No results found for any visits on 04/27/24.    The 10-year ASCVD risk score (Arnett DK, et al., 2019) is: 0.6%* (Cholesterol units were assumed)    Assessment & Plan:   Problem List Items Addressed This Visit       Cardiovascular and Mediastinum   Headache, menstrual migraine   History of the same.  Patient having more frequent headaches since coming off the hormone replacement therapy through [facility].  She anticipates going back to them and restarting.  Continue using Maxalt  10 mg daily as needed.        Other   GAD (generalized anxiety disorder)   Currently maintained on duloxetine  60 mg daily.  Continue.  Patient denies HI/SI/AVH      Depression   Maintained on duloxetine  60 mg daily.  Continue.  Patient denies HI/SI/AVH      Attention deficit hyperactivity disorder (ADHD)   Patient currently maintained on Vyvanse  30 mg daily.  Continue      Preventative health care - Primary   Discussed age-appropriate immunization screening exams.  Did review patient's personal, surgical, social, family histories.  Patient is up-to-date on all age-appropriate vaccinations she would like.  Cologuard ordered for CRC screening.  Patient up-to-date on breast cancer screening and cervical cancer screening.  Patient was given information at discharge about preventative healthcare maintenance with anticipatory guidance.      Relevant Orders   CBC   Comprehensive metabolic panel with GFR   TSH   Obesity (BMI 30-39.9)   History of the same.  Currently maintained on Wegovy  2.4 mg weekly.  Pending TSH, A1c, lipid panel      Relevant Medications    Semaglutide -Weight Management (WEGOVY ) 2.4 MG/0.75ML SOAJ   Other Relevant Orders   Hemoglobin A1c   Lipid panel   Other Visit Diagnoses       Screening for lipid disorders       Relevant Orders   Lipid panel     Screening for  diabetes mellitus       Relevant Orders   Hemoglobin A1c     Paresthesia       Relevant Orders   Vitamin B12     Screening for colon cancer       Relevant Orders   Cologuard       Return in about 3 months (around 07/28/2024) for wegovy  recheck .    Margarie Shay, NP

## 2024-04-27 NOTE — Telephone Encounter (Signed)
 Left detailed voicemail for patient to call the office back if she has any trouble picking up script of wegovy .

## 2024-04-27 NOTE — Telephone Encounter (Signed)
 Pharmacy Patient Advocate Encounter  Received notification from Rx Advocate that Prior Authorization for Wegovy  2.4 has been APPROVED from 04/27/24 to 10/24/24.   PA #/Case ID/Reference #: [id number] Per patient insurance plan, must be transferred to [facility] pharmacy.

## 2024-04-29 ENCOUNTER — Ambulatory Visit: Payer: Self-pay | Admitting: Nurse Practitioner

## 2024-05-04 ENCOUNTER — Encounter: Payer: Self-pay | Admitting: Nurse Practitioner

## 2024-05-05 ENCOUNTER — Other Ambulatory Visit: Payer: Self-pay

## 2024-05-05 ENCOUNTER — Other Ambulatory Visit (HOSPITAL_COMMUNITY): Payer: Self-pay

## 2024-05-05 MED ORDER — VALACYCLOVIR HCL 1 G PO TABS: 1000.0000 mg | ORAL_TABLET | Freq: Three times a day (TID) | ORAL | 0 refills | Status: DC

## 2024-05-05 MED ORDER — VALACYCLOVIR HCL 1 G PO TABS
1000.0000 mg | ORAL_TABLET | Freq: Three times a day (TID) | ORAL | 0 refills | Status: DC
  Filled 2024-05-05: qty 21, 7d supply, fill #0

## 2024-05-05 NOTE — Telephone Encounter (Signed)
 Copied from CRM [id number]. Topic: Clinical - Prescription Issue >> May 05, 2024  3:34 PM Sherri Miller wrote: Reason for CRM: Pt stated that she got a medication refill for the valACYclovir  (VALTREX ) 1000 MG tablet and it was sent to the wrong pharmacy. Pt stated that it was sent to the [facility] pharmacy and it should have been sent to the the [facility] in [place] . Address is [address] [place] , [place] [address], pt would like for it be sent today if possible.

## 2024-05-05 NOTE — Addendum Note (Signed)
 Addended by: Dorothe Gaster on: 05/05/2024 01:25 PM   Modules accepted: Orders

## 2024-05-05 NOTE — Telephone Encounter (Signed)
 Pt is presently in [place]  and request valacyclovir that was sent to [facility] pharmacy [facility] directly to [facility] [place] [address] [place]. Kennie Peach said could take up to 48hrs to get rx transferred. [facility]clovir [facility] to [facility] and I called [facility] pharmacy spoke with [facility] and cancelled rx there for valacyclovir. Sending note to Rosita Cools NP and cable pool.

## 2024-05-06 MED ORDER — VALACYCLOVIR HCL 1 G PO TABS: 1000.0000 mg | ORAL_TABLET | Freq: Three times a day (TID) | ORAL | 0 refills | Status: AC

## 2024-05-06 NOTE — Addendum Note (Signed)
 Addended by: Dorothe Gaster on: 05/06/2024 12:45 PM   Modules accepted: Orders

## 2024-05-18 ENCOUNTER — Other Ambulatory Visit: Payer: Self-pay | Admitting: Nurse Practitioner

## 2024-05-18 DIAGNOSIS — F909 Attention-deficit hyperactivity disorder, unspecified type: Secondary | ICD-10-CM

## 2024-05-18 LAB — COLOGUARD: COLOGUARD: NEGATIVE

## 2024-07-08 ENCOUNTER — Encounter: Payer: PRIVATE HEALTH INSURANCE | Admitting: Nurse Practitioner

## 2024-08-09 ENCOUNTER — Ambulatory Visit: Payer: PRIVATE HEALTH INSURANCE | Admitting: Nurse Practitioner

## 2024-08-09 VITALS — BP 116/80 | HR 67 | Temp 97.4°F | Ht 67.85 in | Wt 217.4 lb

## 2024-08-09 DIAGNOSIS — F3341 Major depressive disorder, recurrent, in partial remission: Secondary | ICD-10-CM | POA: Diagnosis not present

## 2024-08-09 DIAGNOSIS — E669 Obesity, unspecified: Secondary | ICD-10-CM | POA: Diagnosis not present

## 2024-08-09 DIAGNOSIS — M47816 Spondylosis without myelopathy or radiculopathy, lumbar region: Secondary | ICD-10-CM

## 2024-08-09 DIAGNOSIS — M5416 Radiculopathy, lumbar region: Secondary | ICD-10-CM | POA: Diagnosis not present

## 2024-08-09 MED ORDER — TIRZEPATIDE-WEIGHT MANAGEMENT 5 MG/0.5ML ~~LOC~~ SOLN: 5.0000 mg | SUBCUTANEOUS | 0 refills | Status: DC

## 2024-08-09 NOTE — Patient Instructions (Signed)
 Nice to see you today I have sent in the Zepbound  5mg  Follow up with me in 3 months

## 2024-08-09 NOTE — Progress Notes (Signed)
 Established Patient Office Visit  Subjective   Patient ID: Sherri Miller, female    DOB: July 19, 1976  Age: 48 y.o. MRN: [id number]  Chief Complaint  Patient presents with   Follow-up    Wegovy  Recheck, Pt complains of need to switch to Zepbound . States she has not seen improvement with Wegovy .     HPI  Obesity: Patient currently maintained on Wegovy  2.4 mg weekly  ADHD: Patient currently maintained on lisdexamfetamine 30 mg daily  Discussed the use of AI scribe software for clinical note transcription with the patient, who gave verbal consent to proceed.  History of Present Illness Sherri Miller is a 48 year old female with obesity and depression who presents with inadequate response to Wegovy  for weight management.  She experiences an inadequate response to Wegovy , with persistent 'food noise' and increased food intake despite adherence to the 2.4 mg weekly dosage.  Her physical activity is limited due to hip and back issues, restricting her to low-impact exercises such as walking and using a vibration plate for planks. Her work also contributes to her physical activity.  Nutritionally, she typically consumes a protein shake in the morning but reports inconsistency with lunch. She lacks motivation for meal preparation, which she attributes to her antidepressant medication or a possible exacerbation of depression.  She is currently taking 60 mg of duloxetine  daily and has noticed a lack of motivation and a desire to remain inactive at home. She has been without trazodone  for three days, causing significant discomfort. She mentions a previous experience with 40 mg of Vyvanse , which she found more effective than her current dose.  She recently resumed hormone replacement therapy (HRT) three months ago, including low-dose estrogen, progesterone , and testosterone . She reports some improvement in mood and cognitive function, although she still experiences some brain fog.  She  experiences occasional constipation with Wegovy , which she manages with Colace, noting that hydration significantly impacts her bowel regularity.    Review of Systems  Constitutional:  Negative for chills and fever.  Respiratory:  Negative for shortness of breath.   Cardiovascular:  Negative for chest pain.  Gastrointestinal:  Positive for constipation.       Bm daily to every other day   Neurological:  Negative for headaches.  Psychiatric/Behavioral:  Negative for hallucinations and suicidal ideas.       Objective:     BP 116/80   Pulse 67   Temp (!) 97.4 F (36.3 C) (Oral)   Ht 5' 7.85 (1.723 m)   Wt 217 lb 6.4 oz (98.6 kg)   SpO2 96%   BMI 33.20 kg/m  BP Readings from Last 3 Encounters:  08/09/24 116/80  04/27/24 102/80  02/05/24 110/80   Wt Readings from Last 3 Encounters:  08/09/24 217 lb 6.4 oz (98.6 kg)  04/27/24 211 lb (95.7 kg)  02/05/24 210 lb 9.6 oz (95.5 kg)   SpO2 Readings from Last 3 Encounters:  08/09/24 96%  04/27/24 96%  02/05/24 99%      Physical Exam Vitals and nursing note reviewed.  Constitutional:      Appearance: Normal appearance.  Cardiovascular:     Rate and Rhythm: Normal rate and regular rhythm.     Heart sounds: Normal heart sounds.  Pulmonary:     Effort: Pulmonary effort is normal.     Breath sounds: Normal breath sounds.  Abdominal:     General: Bowel sounds are normal.  Neurological:     Mental Status: She is alert.  No results found for any visits on 08/09/24.    The 10-year ASCVD risk score (Arnett DK, et al., 2019) is: 0.7%    Assessment & Plan:   Problem List Items Addressed This Visit       Nervous and Auditory   Lumbar radiculopathy   Relevant Medications   tirzepatide  5 MG/0.5ML injection vial     Musculoskeletal and Integument   Lumbar spondylosis     Other   Major depressive disorder, recurrent episode, in partial remission (HCC)   Obesity (BMI 30-39.9) - Primary   Relevant Medications    tirzepatide  5 MG/0.5ML injection vial  Assessment and Plan Assessment & Plan Obesity On Wegovy  2.4 mg weekly without fullness, increased hunger. Suboptimal nutrition possibly due to antidepressants or depression. Insurance covers Zepbound , switch planned. - Switch to Zepbound  5 mg weekly, pending insurance approval. - Ensure prior authorization for Zepbound . - Continue current exercise regimen as tolerated.  Major depressive disorder Lack of motivation and numbness may indicate excessive antidepressant effects or exacerbation of depression. On duloxetine  60 mg daily. HRT initiated for menopausal symptoms, awaiting lab results. - Review HRT lab results and consult with HRT provider. - Consider adjusting duloxetine  dosage based on HRT lab results and consultation. - Monitor mood and motivation levels.  Insomnia Discomfort due to absence of trazodone  for three days. Prescription issue with 30-day supply instead of 90-day. - Resolve trazodone  prescription issue for a 90-day supply with refills.  Attention-deficit hyperactivity disorder Better symptom control with Vyvanse  40 mg, currently on 30 mg . - Discuss Vyvanse  dosage preferences and adjust prescription if necessary.  Menopausal state Postmenopausal, on HRT for three months, aiding mood and brain fog. On low-dose estrogen, progesterone , and testosterone . Awaiting lab results to assess HRT effectiveness. - Review HRT lab results a - Continue monitoring menopausal symptoms and HRT effectiveness.  Chronic hip and back pain Chronic pain limits physical activity to low-impact exercises. - Continue low-impact exercises as tolerated.   Return in about 3 months (around 11/08/2024) for weight loss/zepbound  .    Adina Crandall, NP

## 2024-08-10 ENCOUNTER — Other Ambulatory Visit: Payer: Self-pay | Admitting: Nurse Practitioner

## 2024-08-10 ENCOUNTER — Encounter: Payer: Self-pay | Admitting: Nurse Practitioner

## 2024-08-10 DIAGNOSIS — F321 Major depressive disorder, single episode, moderate: Secondary | ICD-10-CM

## 2024-08-10 DIAGNOSIS — F909 Attention-deficit hyperactivity disorder, unspecified type: Secondary | ICD-10-CM

## 2024-08-10 MED ORDER — LISDEXAMFETAMINE DIMESYLATE 40 MG PO CAPS: 40.0000 mg | ORAL_CAPSULE | ORAL | 0 refills | Status: AC

## 2024-08-30 ENCOUNTER — Encounter: Payer: Self-pay | Admitting: Nurse Practitioner

## 2024-08-31 MED ORDER — ZEPBOUND 7.5 MG/0.5ML ~~LOC~~ SOAJ: 7.5000 mg | SUBCUTANEOUS | 0 refills | Status: DC

## 2024-09-05 ENCOUNTER — Telehealth: Payer: Self-pay

## 2024-09-05 DIAGNOSIS — Z1231 Encounter for screening mammogram for malignant neoplasm of breast: Secondary | ICD-10-CM

## 2024-09-05 NOTE — Telephone Encounter (Signed)
 Left detailed voicemail for patient to call the office back if any concerns.   Left mychart message with Breast center information.

## 2024-09-05 NOTE — Telephone Encounter (Signed)
 Copied from CRM #[id number]. Topic: Clinical - Request for Lab/Test Order >> Sep 05, 2024 10:52 AM Timindy P wrote: Reason for CRM: Patient is calling to request an order for her Mammogram. Patient thought this was placed during her physical but there are no current orders. Patient is asking if this can please be ordered and she receive a call to schedule. Patient can be reached at [phone] (home)    ----------------------------------------------------------------------- From previous Reason for Contact - Appt Info/Confirm: Patient/patient representative is calling for information regarding an appointment.

## 2024-09-05 NOTE — Telephone Encounter (Signed)
 Last mammogram done 06/05/23 ok to place order for patient?

## 2024-09-05 NOTE — Telephone Encounter (Signed)
 Order for mammogram placed. She needs to call and schedule at   [facility] [address] [place] [address]  [phone]

## 2024-09-05 NOTE — Addendum Note (Signed)
 Addended by: WENDEE LYNWOOD HERO on: 09/05/2024 02:11 PM   Modules accepted: Orders

## 2024-09-14 ENCOUNTER — Other Ambulatory Visit: Payer: Self-pay | Admitting: Nurse Practitioner

## 2024-09-20 ENCOUNTER — Telehealth: Payer: PRIVATE HEALTH INSURANCE | Admitting: Family Medicine

## 2024-09-20 DIAGNOSIS — R21 Rash and other nonspecific skin eruption: Secondary | ICD-10-CM | POA: Diagnosis not present

## 2024-09-20 MED ORDER — NYSTATIN-TRIAMCINOLONE 100000-0.1 UNIT/GM-% EX OINT: 1.0000 | TOPICAL_OINTMENT | Freq: Two times a day (BID) | CUTANEOUS | 0 refills | Status: AC

## 2024-09-20 NOTE — Progress Notes (Signed)
 E Visit for Rash  We are sorry that you are not feeling well. Here is how we plan to help!  Based on what you shared with me it looks like you have contact dermatitis.  Contact dermatitis is a skin rash caused by something that touches the skin and causes irritation or inflammation.  Your skin may be red, swollen, dry, cracked, and itch.  The rash should go away in a few days but can last a few weeks.  If you get a rash, it's important to figure out what caused it so the irritant can be avoided in the future. With a Mycolog cream use as directed.     HOME CARE:  Take cool showers and avoid direct sunlight. Apply cool compress or wet dressings. Take a bath in an oatmeal bath.  Sprinkle content of one Aveeno packet under running faucet with comfortably warm water.  Bathe for 15-20 minutes, 1-2 times daily.  Pat dry with a towel. Do not rub the rash. Use hydrocortisone cream. Take an antihistamine like Benadryl for widespread rashes that itch.  The adult dose of Benadryl is 25-50 mg by mouth 4 times daily. Caution:  This type of medication may cause sleepiness.  Do not drink alcohol , drive, or operate dangerous machinery while taking antihistamines.  Do not take these medications if you have prostate enlargement.  Read package instructions thoroughly on all medications that you take.  GET HELP RIGHT AWAY IF:  Symptoms don't go away after treatment. Severe itching that persists. If you rash spreads or swells. If you rash begins to smell. If it blisters and opens or develops a yellow-brown crust. You develop a fever. You have a sore throat. You become short of breath.  MAKE SURE YOU:  Understand these instructions. Will watch your condition. Will get help right away if you are not doing well or get worse.  Thank you for choosing an e-visit. Your e-visit answers were reviewed by a board certified advanced clinical practitioner to complete your personal care plan. Depending upon the  condition, your plan could have included both over the counter or prescription medications. Please review your pharmacy choice. Be sure that the pharmacy you have chosen is open so that you can pick up your prescription now.  If there is a problem you may message your provider in MyChart to have the prescription routed to another pharmacy. Your safety is important to us . If you have drug allergies check your prescription carefully.  For the next 24 hours, you can use MyChart to ask questions about today's visit, request a non-urgent call back, or ask for a work or school excuse from your e-visit provider. You will get an email in the next two days asking about your experience. I hope that your e-visit has been valuable and will speed your recovery.  I have spent 5 minutes in review of e-visit questionnaire, review and updating patient chart, medical decision making and response to patient.   Chiquita CHRISTELLA Barefoot, NP

## 2024-10-04 ENCOUNTER — Encounter: Payer: Self-pay | Admitting: Nurse Practitioner

## 2024-10-05 MED ORDER — ZEPBOUND 10 MG/0.5ML ~~LOC~~ SOAJ: 10.0000 mg | SUBCUTANEOUS | 0 refills | Status: DC

## 2024-10-13 ENCOUNTER — Other Ambulatory Visit (HOSPITAL_COMMUNITY): Payer: Self-pay

## 2024-10-14 ENCOUNTER — Telehealth: Payer: Self-pay

## 2024-10-14 ENCOUNTER — Other Ambulatory Visit: Payer: Self-pay | Admitting: Nurse Practitioner

## 2024-10-14 ENCOUNTER — Other Ambulatory Visit (HOSPITAL_COMMUNITY): Payer: Self-pay

## 2024-10-14 DIAGNOSIS — G4700 Insomnia, unspecified: Secondary | ICD-10-CM

## 2024-10-14 NOTE — Telephone Encounter (Signed)
 Pharmacy Patient Advocate Encounter   Received notification from Onbase that prior authorization for Wegovy  2.4 is required/requested.   Insurance verification completed.   The patient is insured through [employer].   Per test claim: Refill too soon. PA is not needed at this time. Medication was filled 10/05/24. Next eligible fill date is (no date given).   Patients current PA expires 10/24/24. Can resubmit closer to that date. Message from [facility]:

## 2024-10-14 NOTE — Telephone Encounter (Signed)
 Message sent back to patient informing her of this.

## 2024-10-21 ENCOUNTER — Ambulatory Visit: Payer: PRIVATE HEALTH INSURANCE

## 2024-10-21 ENCOUNTER — Ambulatory Visit (INDEPENDENT_AMBULATORY_CARE_PROVIDER_SITE_OTHER)
Admission: RE | Admit: 2024-10-21 | Discharge: 2024-10-21 | Disposition: A | Discharge status: Home / Self Care | Payer: PRIVATE HEALTH INSURANCE | Source: Ambulatory Visit

## 2024-10-21 ENCOUNTER — Telehealth: Payer: Self-pay

## 2024-10-21 VITALS — BP 104/74 | HR 78 | Temp 98.2°F | Ht 65.75 in | Wt 209.0 lb

## 2024-10-21 DIAGNOSIS — J45901 Unspecified asthma with (acute) exacerbation: Secondary | ICD-10-CM | POA: Diagnosis not present

## 2024-10-21 DIAGNOSIS — R051 Acute cough: Secondary | ICD-10-CM

## 2024-10-21 DIAGNOSIS — J189 Pneumonia, unspecified organism: Secondary | ICD-10-CM

## 2024-10-21 MED ORDER — CEFDINIR 300 MG PO CAPS: 300.0000 mg | ORAL_CAPSULE | Freq: Two times a day (BID) | ORAL | 0 refills | Status: AC

## 2024-10-21 MED ORDER — FLUTICASONE-SALMETEROL 100-50 MCG/ACT IN AEPB: 1.0000 | INHALATION_SPRAY | Freq: Two times a day (BID) | RESPIRATORY_TRACT | 3 refills | Status: AC

## 2024-10-21 MED ORDER — PREDNISONE 20 MG PO TABS: 40.0000 mg | ORAL_TABLET | Freq: Every day | ORAL | 0 refills | Status: DC

## 2024-10-21 MED ORDER — HYDROCODONE BIT-HOMATROP MBR 5-1.5 MG/5ML PO SOLN: 5.0000 mL | Freq: Three times a day (TID) | ORAL | 0 refills | Status: AC | PRN

## 2024-10-21 MED ORDER — DOXYCYCLINE HYCLATE 100 MG PO CAPS: 100.0000 mg | ORAL_CAPSULE | Freq: Two times a day (BID) | ORAL | 0 refills | Status: AC

## 2024-10-21 NOTE — Patient Instructions (Signed)
 Thank you for visiting [facility] today! Here's what we talked about: - START Advair  twice daily - Use cough syrup as needed for 5 days

## 2024-10-21 NOTE — Telephone Encounter (Signed)
 CXR showng LLL consolidation, abx sent

## 2024-10-21 NOTE — Progress Notes (Signed)
 Subjective:   This visit was conducted in person. The patient gave informed consent to the use of Abridge AI technology to record the contents of the encounter as documented below.   Patient ID: Sherri Miller, female    DOB: 06-26-1976, 48 y.o.   MRN: [id number]   Discussed the use of AI scribe software for clinical note transcription with the patient, who gave verbal consent to proceed.  History of Present Illness Sherri Miller is a 48 year old female with chronic bronchitis and asthma who presents with a cough and respiratory symptoms following a recent cold exposure.  She was exposed to someone with a cold approximately three to four days ago, leading to symptoms including a sore throat, runny nose, and congestion. The sore throat has resolved, but congestion persists primarily in the head. She is now experiencing a cough that has moved into her chest, producing green sputum, particularly in the mornings.  She has a history of chronic bronchitis, which occurs almost annually, often resulting in a cough lasting three to six months. She has been using her albuterol  inhaler during bronchospasms, which occur during severe coughing spells, but reports using it only a few times a day over the past three days.  She has a history of asthma and bilateral pulmonary embolisms, and has been formally diagnosed with asthma and chronic bronchitis by a pulmonologist. She was previously on Advair  but discontinued it as her symptoms improved during the COVID-19 pandemic when mask-wearing reduced her exposure to colds.  She recently completed a course of oral steroids for back pain, which ended about a week ago. Her symptoms began after stopping the steroids. She has been using over-the-counter medications like Zycam and DayQuil, which provide some relief during the day, but her cough persists, especially at night, affecting her sleep.  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 She lives alone and has a boyfriend who is also currently sick. She typically requires multiple treatments including nebulizers, cough medicines, and occasionally steroids to manage her symptoms.  No headache, body aches, fever, chills, or chest pain. Reports difficulty breathing during coughing spells.  Review of Systems  All other systems reviewed and are negative.       Allergies  Allergen Reactions   Penicillins Hives    Did it involve swelling of the face/tongue/throat, SOB, or low BP?No Did it involve sudden or severe rash/hives, skin peeling, or any reaction on the inside of your mouth or nose? Yes Did you need to seek medical attention at a hospital or doctor's office?No When did it last happen? Teenager    If all above answers are "NO", may proceed with cephalosporin use.   Latex Rash   Percocet [Oxycodone -Acetaminophen ] Other (See Comments)    Current Outpatient Medications on File Prior to Visit  Medication Sig Dispense Refill   albuterol  (VENTOLIN  HFA) 108 (90 Base) MCG/ACT inhaler Inhale 2 puffs into the lungs every 6 (six) hours as needed for wheezing or shortness of breath. 8 g 2   Calcium Carbonate-Vit D-Min (CALCIUM 1200 PO) Take by mouth. Not sure of the dose     CEQUA 0.09 % SOLN Apply 1 drop to eye 2 (two) times daily.     clonazePAM  (KLONOPIN ) 0.5 MG tablet Take 1 tablet by mouth 2 (two) times daily as needed.     DULoxetine  (CYMBALTA ) 60 MG capsule Take 1 capsule (60 mg  total) by mouth daily. 90 capsule 1   estradiol  (VIVELLE -DOT) 0.025 MG/24HR Place 1 patch onto the skin.     lisdexamfetamine (VYVANSE ) 40 MG capsule Take 1 capsule (40 mg total) by mouth every morning. 30 capsule 0   lisdexamfetamine (VYVANSE ) 40 MG capsule Take 1 capsule (40 mg total) by mouth every morning. 30 capsule 0   lisdexamfetamine (VYVANSE ) 40 MG capsule Take 1 capsule (40 mg total) by mouth every morning. 30 capsule 0    methocarbamol  (ROBAXIN ) 500 MG tablet Take 1 tablet (500 mg total) by mouth 3 (three) times daily as needed for muscle spasms. 30 tablet 0   montelukast  (SINGULAIR ) 10 MG tablet Take 1 tablet (10 mg total) by mouth at bedtime. 90 tablet 3   nystatin-triamcinolone ointment (MYCOLOG) Apply 1 Application topically 2 (two) times daily. 60 g 0   ondansetron  (ZOFRAN -ODT) 4 MG disintegrating tablet Dissolve 1 tablet on the tongue every 8 hours as needed. 30 tablet 1   Prasterone, DHEA, (DHEA 50 PO) Take by mouth.     progesterone  (PROMETRIUM ) 100 MG capsule Take 100-200 mg by mouth.     rizatriptan  (MAXALT ) 10 MG tablet Take 1 tablet (10 mg total) by mouth as needed for migraine. May repeat dose in 2 hours if needed. Not to exceed 2 doses in 24 hours. 20 tablet 1   thyroid  (ARMOUR) 30 MG tablet Take 30 mg by mouth daily before breakfast.     tirzepatide  (ZEPBOUND ) 10 MG/0.5ML Pen Inject 10 mg into the skin once a week. 2 mL 0   traZODone  (DESYREL ) 150 MG tablet Take 1 tablet by mouth at bedtime. 90 tablet 1   trimethoprim -polymyxin b  (POLYTRIM ) ophthalmic solution Apply 1-2 drops into affected eye 4 times a day x 5 days. 10 mL 0   valACYclovir  (VALTREX ) 1000 MG tablet Take 1 tablet (1,000 mg total) by mouth 3 (three) times daily. 21 tablet 0   VEVYE 0.1 % SOLN Apply 1 drop to eye 2 (two) times daily.     VITAMIN D  PO Take by mouth. With K-5000 u ?     No current facility-administered medications on file prior to visit.    BP 104/74 (BP Location: Left Arm, Patient Position: Sitting, Cuff Size: Large)   Pulse 78   Temp 98.2 F (36.8 C) (Oral)   Ht 5' 5.75 (1.67 m)   Wt 209 lb (94.8 kg)   SpO2 99%   BMI 33.99 kg/m   Objective:      Physical Exam GENERAL: Alert, cooperative, well developed, no acute distress. HEAD: Normocephalic atraumatic. EYES: Extraocular movements intact BL, pupils round, equal and reactive to light BL, conjunctivae normal BL. CARDIOVASCULAR: Normal heart rate and  rhythm, S1 and S2 normal without murmurs. CHEST: Clear to auscultation bilaterally, no wheezes, rhonchi, or crackles. ABDOMEN: Soft, non tender, non distended, without organomegaly, normal bowel sounds. EXTREMITIES: No cyanosis or edema. NEUROLOGICAL: Oriented to person, place and time, no gait abnormalities, moves all extremities without gross motor or sensory deficit.      Assessment & Plan:   Assessment & Plan Acute cough in the setting of pre-existing asthma Cough likely due to viral infection, patient declining COVID or flu swab.  Presenting with persistent cough and nocturnal symptoms.  Her asthma is under-treated, and uses albuterol , now exacerbated by viral infection.  On cardiorespiratory exam there is no wheezing, no indication for systemic steroids at this time, however, we will restart her on Advair  for better control, cough syrup for symptomatic  relief, XR to rule out pneumonia.  - Prescribed Advair  one puff twice dailyy. - Continue albuterol  as needed for bronchospasm. - Ordered chest x-ray to rule out pneumonia. - Prescribed cough syrup 5 mL every 8 hours as needed for five days.     Return for worsening of symptoms or failure to improve.   Kwamaine Cuppett K Quintella Mura, MD  10/21/24     Contains text generated by Abridge.

## 2024-11-08 ENCOUNTER — Ambulatory Visit: Payer: PRIVATE HEALTH INSURANCE | Admitting: Nurse Practitioner

## 2024-11-14 ENCOUNTER — Ambulatory Visit: Payer: Self-pay

## 2024-11-14 ENCOUNTER — Encounter: Payer: Self-pay | Admitting: Nurse Practitioner

## 2024-11-14 NOTE — Telephone Encounter (Signed)
 FYI Only or Action Required?: FYI only for provider: calling back to schedule.  Patient was last seen in primary care on 10/21/2024 by Bennett Reuben POUR, MD.  Called Nurse Triage reporting Cough.  Symptoms began several weeks ago.  Interventions attempted: Prescription medications: abx.  Symptoms are: unchanged.  Triage Disposition: See PCP When Office is Open (Within 3 Days)  Patient/caregiver understands and will follow disposition?: Yes Reason for Disposition  Cough has been present for > 3 weeks  Answer Assessment - Initial Assessment Questions Dx with pneumonia and was put on 2 abx. Traveled to [place] and has not gotten better. Attempted to schedule wit another provider in office patient denied, she wants to see PCP as she also needs a refill on Zepbound . Patient unable to make PCP next available as she works. Denied scheduling at this time and stated she will call back to schedule on Thursday if she can get off work.  1. ONSET: When did the cough begin?      Around 11/14  2. SPUTUM: Describe the color of your sputum (e.g., none, dry cough; clear, white, yellow, green)     Only a little, most of it is not coming up. White, yellow  3. HEMOPTYSIS: Are you coughing up any blood? If Yes, ask: How much? (e.g., flecks, streaks, tablespoons, etc.)     Denies  4. DIFFICULTY BREATHING: Are you having difficulty breathing? If Yes, ask: How bad is it? (e.g., mild, moderate, severe)      Denies  5. FEVER: Do you have a fever? If Yes, ask: What is your temperature, how was it measured, and when did it start?     Denies  6. CARDIAC HISTORY: Do you have any history of heart disease? (e.g., heart attack, congestive heart failure)      Denies  7 LUNG HISTORY: Do you have any history of lung disease?  (e.g., pulmonary embolus, asthma, emphysema)     Chronic bronchitis  8. OTHER SYMPTOMS: Do you have any other symptoms? (e.g., runny nose, wheezing, chest pain)        Head congestion  Protocols used: Cough - Acute Productive-A-AH  Copied from CRM #[id number]. Topic: Clinical - Red Word Triage >> Nov 14, 2024  9:40 AM Montie POUR wrote: Red Word that prompted transfer to Nurse Triage:  She has been coughing and green mucus is still coming out. She had an office visit on 10/21/24 and it's not getting better.

## 2024-11-14 NOTE — Telephone Encounter (Signed)
 Noted

## 2024-11-15 ENCOUNTER — Ambulatory Visit: Payer: PRIVATE HEALTH INSURANCE | Admitting: Nurse Practitioner

## 2024-11-15 VITALS — BP 118/86 | HR 74 | Temp 97.8°F | Ht 65.75 in | Wt 211.2 lb

## 2024-11-15 DIAGNOSIS — R051 Acute cough: Secondary | ICD-10-CM

## 2024-11-15 DIAGNOSIS — J45901 Unspecified asthma with (acute) exacerbation: Secondary | ICD-10-CM

## 2024-11-15 DIAGNOSIS — E669 Obesity, unspecified: Secondary | ICD-10-CM

## 2024-11-15 DIAGNOSIS — R0602 Shortness of breath: Secondary | ICD-10-CM

## 2024-11-15 MED ORDER — ZEPBOUND 12.5 MG/0.5ML ~~LOC~~ SOAJ: 12.5000 mg | SUBCUTANEOUS | 0 refills | Status: DC

## 2024-11-15 MED ORDER — HYDROCODONE BIT-HOMATROP MBR 5-1.5 MG/5ML PO SOLN: 5.0000 mL | Freq: Two times a day (BID) | ORAL | 0 refills | Status: AC | PRN

## 2024-11-15 MED ORDER — ALBUTEROL SULFATE HFA 108 (90 BASE) MCG/ACT IN AERS: 2.0000 | INHALATION_SPRAY | Freq: Four times a day (QID) | RESPIRATORY_TRACT | 2 refills | Status: AC | PRN

## 2024-11-15 NOTE — Progress Notes (Signed)
 Established Patient Office Visit  Subjective   Patient ID: Sherri Miller, female    DOB: August 24, 1976  Age: 48 y.o. MRN: [id number]  Chief Complaint  Patient presents with   Cough    Pt complains of ongoing cough for 4 weeks due to bronchitis.    Medication Refill    Zepbound      Discussed the use of AI scribe software for clinical note transcription with the patient, who gave verbal consent to proceed.  History of Present Illness Sherri Miller is a 48 year old female with asthma who presents with persistent cough and respiratory symptoms.  She has been experiencing a persistent cough and respiratory symptoms. Initially suspected to be a viral infection, she was prescribed Advair  and albuterol , along with a chest x-ray and cough syrup. However, she did not receive a renewal for albuterol  and has been using expired medication. Her symptoms are worse in the morning and evening, with thick white or yellow sputum production and headaches from coughing. No shortness of breath, fever, chills, chest pain, sore throat, nausea, vomiting, diarrhea, or body aches. She also reports sinus congestion in the mornings. She was messaged after x-ray and treated with 2 different oral antibiotics that she completed  She has a history of sinus congestion and has been experiencing joint swelling and water retention after consuming bread and pasta, which she associates with her dietary intake. Notably, she did not experience these symptoms while in [place], despite consuming similar foods.  She is currently on tirzepatide  (Zepbound ) for weight management and has been managing her dosage due to concerns about side effects. She missed doses during her trip and has been cautious about resuming due to potential side effects. She reports eating three meals a day with protein-rich snacks and staying hydrated. Physical activity has decreased due to back pain and cold weather, and she recently completed a steroid taper  for back pain.  She lacks motivation and desire to engage in activities, although she does not feel depressed. She has previously adjusted her antidepressant dosage due to feeling 'too numb' and is considering a change in medication to address her current lack of motivation.     Review of Systems  Constitutional:  Negative for chills and fever.  HENT:  Positive for sinus pain. Negative for ear pain and sore throat.   Respiratory:  Positive for cough and sputum production. Negative for shortness of breath and wheezing.   Cardiovascular:  Negative for chest pain.  Gastrointestinal:  Negative for abdominal pain, nausea and vomiting.  Neurological:  Positive for headaches.      Objective:     BP 118/86   Pulse 74   Temp 97.8 F (36.6 C) (Oral)   Ht 5' 5.75 (1.67 m)   Wt 211 lb 3.2 oz (95.8 kg)   SpO2 98%   BMI 34.35 kg/m  BP Readings from Last 3 Encounters:  11/15/24 118/86  10/21/24 104/74  08/09/24 116/80   Wt Readings from Last 3 Encounters:  11/15/24 211 lb 3.2 oz (95.8 kg)  10/21/24 209 lb (94.8 kg)  08/09/24 217 lb 6.4 oz (98.6 kg)   SpO2 Readings from Last 3 Encounters:  11/15/24 98%  10/21/24 99%  08/09/24 96%      Physical Exam Vitals and nursing note reviewed.  Constitutional:      Appearance: Normal appearance.  HENT:     Right Ear: Tympanic membrane, ear canal and external ear normal.     Left Ear: Tympanic membrane,  ear canal and external ear normal.     Mouth/Throat:     Mouth: Mucous membranes are moist.     Pharynx: Oropharynx is clear.  Cardiovascular:     Rate and Rhythm: Normal rate and regular rhythm.     Heart sounds: Normal heart sounds.  Pulmonary:     Effort: Pulmonary effort is normal.     Breath sounds: Normal breath sounds.  Abdominal:     General: Bowel sounds are normal.  Lymphadenopathy:     Cervical: No cervical adenopathy.  Neurological:     Mental Status: She is alert.      No results found for any visits on  11/15/24.    The 10-year ASCVD risk score (Arnett DK, et al., 2019) is: 0.7%    Assessment & Plan:   Problem List Items Addressed This Visit       Other   Cough - Primary   Relevant Medications   HYDROcodone  bit-homatropine (HYCODAN) 5-1.5 MG/5ML syrup   SOB (shortness of breath)   Relevant Medications   albuterol  (VENTOLIN  HFA) 108 (90 Base) MCG/ACT inhaler   Obesity (BMI 30-39.9)   Relevant Medications   tirzepatide  (ZEPBOUND ) 12.5 MG/0.5ML Pen   Other Visit Diagnoses       Asthma with acute exacerbation, unspecified asthma severity, unspecified whether persistent       Relevant Medications   albuterol  (VENTOLIN  HFA) 108 (90 Base) MCG/ACT inhaler     Assessment and Plan Assessment & Plan Asthma with acute exacerbation Asthma exacerbation with morning and evening symptoms, thick sputum, and headaches. No wheezing, fever, or chills. Previous clear chest x-ray. Current treatment includes expired albuterol . Decision to treat for bacterial exacerbation with antibiotics and steroids. - Prescribed prednisone  40 mg orally daily with food for 5 days. Has some at home from last office visit that she was told to hold off on  - Prescribed albuterol  inhaler. - Prescribed Hycodan for BID use, sedation precautions reviewed . - Continue Advair  inhaler. - Monitor symptoms and report if no improvement in one week.  Obesity Management with tirzepatide  at 10 mg. Recent dietary indiscretion caused water retention and joint swelling. Plan to escalate dose to 12.5 mg as tolerated. No significant side effects except occasional constipation. Discussed potential insurance changes affecting medication coverage. - Increased tirzepatide  to 12.5 mg as tolerated. - Encouraged high protein intake and adequate hydration. - Monitor weight and side effects.  Major depressive disorder, recurrent episode, in partial remission Current symptoms include lack of motivation and desire to engage in activities.  Previous antidepressant adjustments led to numbness. 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 Discussed tapering and switching medications to avoid withdrawal. - Will consider add-on therapy with Abilify or Rexulti for resistant depression. - Will monitor for side effects and effectiveness of new regimen. - Will discuss medication options via MyChart.  Chronic low back pain Recent exacerbation after cessation of steroid treatment. Previous steroid taper was effective but has worn off. Cold weather and increased physical activity may contribute to symptoms.   Return in about 3 months (around 02/13/2025) for CPE and Labs.    Adina Crandall, NP

## 2024-12-16 ENCOUNTER — Encounter: Payer: Self-pay | Admitting: Nurse Practitioner

## 2024-12-16 DIAGNOSIS — E669 Obesity, unspecified: Secondary | ICD-10-CM

## 2024-12-16 DIAGNOSIS — F321 Major depressive disorder, single episode, moderate: Secondary | ICD-10-CM

## 2024-12-16 MED ORDER — ZEPBOUND 12.5 MG/0.5ML ~~LOC~~ SOAJ: 12.5000 mg | SUBCUTANEOUS | 0 refills | Status: AC

## 2024-12-16 MED ORDER — DULOXETINE HCL 60 MG PO CPEP: 60.0000 mg | ORAL_CAPSULE | Freq: Every day | ORAL | 1 refills | Status: DC

## 2025-01-04 ENCOUNTER — Encounter: Payer: Self-pay | Admitting: Nurse Practitioner

## 2025-01-04 DIAGNOSIS — F321 Major depressive disorder, single episode, moderate: Secondary | ICD-10-CM

## 2025-01-05 NOTE — Telephone Encounter (Signed)
 I spoke with pt; pt aid the lack of motivation is the same as when discussed with Matt on 11/15/24; no worse but no better. No SI/HI.pt said at 11/15/24 visit discussed adding med like abilify or rexulti or tapering and changing med. Per pt and office note would try and discuss via my chart message. Pt is now working at [employer] and Adina is not in pts ins network but pt has confidence in [facility] and wants to discuss options thru my chart if possible. Offered pt appt but pt declined due to cost . Pt said would like to wait until Matts return next wk. UC & ED precautions given and pt voiced understanding. Sending note to Adina Crandall NP who is out of office and MARLA Gaskins NP who is in office and [place] pool.

## 2025-01-05 NOTE — Telephone Encounter (Signed)
 Noted and agree that PCP should respond.

## 2025-01-10 MED ORDER — DULOXETINE HCL 30 MG PO CPEP: 30.0000 mg | ORAL_CAPSULE | Freq: Every day | ORAL | 1 refills | Status: AC

## 2025-02-24 ENCOUNTER — Encounter: Payer: PRIVATE HEALTH INSURANCE | Admitting: Nurse Practitioner
